# Patient Record
Sex: Male | Born: 1955
Health system: Southern US, Community
[De-identification: ages and names within clinical notes are randomized; demographics above are authoritative.]

## PROBLEM LIST (undated history)

## (undated) DIAGNOSIS — M797 Fibromyalgia: Secondary | ICD-10-CM

## (undated) DIAGNOSIS — K219 Gastro-esophageal reflux disease without esophagitis: Secondary | ICD-10-CM

## (undated) DIAGNOSIS — R51 Headache: Secondary | ICD-10-CM

## (undated) DIAGNOSIS — R413 Other amnesia: Secondary | ICD-10-CM

## (undated) DIAGNOSIS — F32A Depression, unspecified: Secondary | ICD-10-CM

## (undated) DIAGNOSIS — M109 Gout, unspecified: Secondary | ICD-10-CM

## (undated) DIAGNOSIS — Z951 Presence of aortocoronary bypass graft: Secondary | ICD-10-CM

## (undated) DIAGNOSIS — F419 Anxiety disorder, unspecified: Secondary | ICD-10-CM

## (undated) DIAGNOSIS — E785 Hyperlipidemia, unspecified: Secondary | ICD-10-CM

## (undated) DIAGNOSIS — S161XXA Strain of muscle, fascia and tendon at neck level, initial encounter: Secondary | ICD-10-CM

## (undated) DIAGNOSIS — I2510 Atherosclerotic heart disease of native coronary artery without angina pectoris: Secondary | ICD-10-CM

## (undated) DIAGNOSIS — F329 Major depressive disorder, single episode, unspecified: Secondary | ICD-10-CM

## (undated) DIAGNOSIS — E039 Hypothyroidism, unspecified: Secondary | ICD-10-CM

## (undated) HISTORY — DX: Atherosclerotic heart disease of native coronary artery without angina pectoris: I25.10

## (undated) HISTORY — PX: CATARACT EXTRACTION: SUR2

## (undated) HISTORY — DX: Anxiety disorder, unspecified: F41.9

## (undated) HISTORY — DX: Strain of muscle, fascia and tendon at neck level, initial encounter: S16.1XXA

## (undated) HISTORY — PX: BACK SURGERY: SHX140

## (undated) HISTORY — DX: Gout, unspecified: M10.9

## (undated) HISTORY — DX: Hypothyroidism, unspecified: E03.9

## (undated) HISTORY — DX: Major depressive disorder, single episode, unspecified: F32.9

## (undated) HISTORY — PX: CERVICAL FUSION: SHX112

## (undated) HISTORY — DX: Presence of aortocoronary bypass graft: Z95.1

## (undated) HISTORY — DX: Depression, unspecified: F32.A

## (undated) HISTORY — DX: Fibromyalgia: M79.7

## (undated) HISTORY — DX: Headache: R51

## (undated) HISTORY — PX: KNEE SURGERY: SHX244

## (undated) HISTORY — DX: Gastro-esophageal reflux disease without esophagitis: K21.9

## (undated) HISTORY — DX: Hyperlipidemia, unspecified: E78.5

## (undated) HISTORY — DX: Other amnesia: R41.3

## (undated) HISTORY — PX: ROTATOR CUFF REPAIR: SHX139

---

## 2013-04-26 LAB — CBC AND DIFFERENTIAL: Platelets: 223 10*3/uL (ref 150–399)

## 2013-05-11 LAB — LYME DISEASE AB, QUANT, IGM: Lyme Ab: <0.91

## 2013-05-11 LAB — CBC AND DIFFERENTIAL
Hemoglobin: 15.3 g/dL (ref 13.5–17.5)
WBC: 8.5 10^3/mL

## 2013-05-11 LAB — TSH: TSH: 2.22 u[IU]/mL (ref <=5.90)

## 2013-05-11 LAB — URIC ACID: URIC ACID: 6.2

## 2013-06-10 HISTORY — PX: CORONARY ARTERY BYPASS GRAFT: SHX141

## 2014-07-08 ENCOUNTER — Ambulatory Visit: Payer: Self-pay | Admitting: Family Medicine

## 2014-07-09 ENCOUNTER — Ambulatory Visit (INDEPENDENT_AMBULATORY_CARE_PROVIDER_SITE_OTHER): Payer: 59 | Admitting: Family Medicine

## 2014-07-09 ENCOUNTER — Encounter: Payer: Self-pay | Admitting: Family Medicine

## 2014-07-09 VITALS — BP 135/78 | HR 65 | Ht 70.0 in | Wt 206.0 lb

## 2014-07-09 DIAGNOSIS — M4806 Spinal stenosis, lumbar region: Secondary | ICD-10-CM

## 2014-07-09 DIAGNOSIS — M109 Gout, unspecified: Secondary | ICD-10-CM

## 2014-07-09 DIAGNOSIS — K219 Gastro-esophageal reflux disease without esophagitis: Secondary | ICD-10-CM | POA: Diagnosis not present

## 2014-07-09 DIAGNOSIS — M48061 Spinal stenosis, lumbar region without neurogenic claudication: Secondary | ICD-10-CM

## 2014-07-09 DIAGNOSIS — Z951 Presence of aortocoronary bypass graft: Secondary | ICD-10-CM

## 2014-07-09 DIAGNOSIS — E039 Hypothyroidism, unspecified: Secondary | ICD-10-CM

## 2014-07-09 DIAGNOSIS — E785 Hyperlipidemia, unspecified: Secondary | ICD-10-CM

## 2014-07-09 HISTORY — DX: Gout, unspecified: M10.9

## 2014-07-09 HISTORY — DX: Presence of aortocoronary bypass graft: Z95.1

## 2014-07-09 HISTORY — DX: Hypothyroidism, unspecified: E03.9

## 2014-07-09 MED ORDER — PANTOPRAZOLE SODIUM 40 MG PO TBEC
40.0000 mg | DELAYED_RELEASE_TABLET | Freq: Every day | ORAL | Status: DC
Start: 2014-07-09 — End: 2014-07-09

## 2014-07-09 MED ORDER — LIDOCAINE 5 % EX PTCH: 1.0000 | MEDICATED_PATCH | CUTANEOUS | Status: DC

## 2014-07-09 MED ORDER — DULOXETINE HCL 60 MG PO CPEP: 60.0000 mg | ORAL_CAPSULE | Freq: Every day | ORAL | Status: DC

## 2014-07-09 MED ORDER — ALLOPURINOL 100 MG PO TABS: 100.0000 mg | ORAL_TABLET | Freq: Every day | ORAL | Status: DC

## 2014-07-09 MED ORDER — HYDROCODONE-ACETAMINOPHEN 5-325 MG PO TABS: 1.0000 | ORAL_TABLET | Freq: Four times a day (QID) | ORAL | Status: DC | PRN

## 2014-07-09 MED ORDER — PANTOPRAZOLE SODIUM 40 MG PO TBEC
40.0000 mg | DELAYED_RELEASE_TABLET | Freq: Every day | ORAL | Status: DC
Start: 2014-07-09 — End: 2014-07-22

## 2014-07-09 MED ORDER — ATORVASTATIN CALCIUM 80 MG PO TABS
80.0000 mg | ORAL_TABLET | Freq: Every day | ORAL | Status: DC
Start: 2014-07-09 — End: 2014-07-22

## 2014-07-09 NOTE — Progress Notes (Signed)
CC: John Jefferson is a 59 y.o. male is here for Establish Care   Subjective: HPI:  Pleasant 59 year old here to establish care  History of CABG that was performed a little over a year ago. Prior to that and even today he denies any exertional chest pain. He's been on aspirin ever since the procedure along with Lipitor without myalgias or right upper quadrant pain.  History of hypothyroidism that was noticed a little over a year ago when he was experiencing extreme fatigue. Since starting on levothyroxine he's not noticed any improvement with his fatigue. Current levothyroxine dose had to be increased a little over 6 months ago due to elevated TSH.  History of GERD currently taking Protonix on a daily basis. Denies any epigastric discomfort or reflux symptoms provided he takes this on a daily basis.  History of gout that has involved the arches of the feet and the third fourth and fifth toes bilaterally. He was started on a low-dose of allopurinol a few years ago and has had no joint pain or suspicion of a gout flare ever since.  He tells me he has a history of spinal stenosis in the lumbar region which has required a unknown type of back surgery. He had severe radicular pain down both legs prior to the surgery which has now resolved. He still has chronic pain that he takes hydrocodone for chronic daily basis, pain is located in the lower midline of the lumbar region and is nonradiating. He wants to see a pain management specialist  Review of Systems - General ROS: negative for - chills, fever, night sweats, weight gain or weight loss Ophthalmic ROS: negative for - decreased vision Psychological ROS: negative for - anxiety or depression ENT ROS: negative for - hearing change, nasal congestion, tinnitus or allergies Hematological and Lymphatic ROS: negative for - bleeding problems, bruising or swollen lymph nodes Breast ROS: negative Respiratory ROS: no cough, shortness of breath, or  wheezing Cardiovascular ROS: no chest pain or dyspnea on exertion Gastrointestinal ROS: no abdominal pain, change in bowel habits, or black or bloody stools Genito-Urinary ROS: negative for - genital discharge, genital ulcers, incontinence or abnormal bleeding from genitals Musculoskeletal ROS: negative for - joint pain or muscle pain other than that described above Neurological ROS: negative for - headaches or memory loss Dermatological ROS: negative for lumps, mole changes, rash and skin lesion changes  Past Medical History  Diagnosis Date  . S/P CABG x 3 07/09/2014  . Hypothyroidism 07/09/2014  . Gout 07/09/2014    No past surgical history on file. No family history on file.  History   Social History  . Marital Status: Married    Spouse Name: N/A  . Number of Children: N/A  . Years of Education: N/A   Occupational History  . Not on file.   Social History Main Topics  . Smoking status: Former Smoker    Quit date: 07/09/1986  . Smokeless tobacco: Not on file  . Alcohol Use: No  . Drug Use: No  . Sexual Activity:    Partners: Female   Other Topics Concern  . Not on file   Social History Narrative  . No narrative on file     Objective: BP 135/78 mmHg  Pulse 65  Ht 5\' 10"  (1.778 m)  Wt 206 lb (93.441 kg)  BMI 29.56 kg/m2  General: Alert and Oriented, No Acute Distress HEENT: Pupils equal, round, reactive to light. Conjunctivae clear.  Moist mucous membranes Lungs: Clear to auscultation  bilaterally, no wheezing/ronchi/rales.  Comfortable work of breathing. Good air movement. Cardiac: Regular rate and rhythm. Normal S1/S2.  No murmurs, rubs, nor gallops.  No carotid bruits Extremities: No peripheral edema.  Strong peripheral pulses.  Mental Status: No depression, anxiety, nor agitation. Skin: Warm and dry.  Assessment & Plan: Michel was seen today for establish care.  Diagnoses and all orders for this visit:  S/P CABG x 3 Orders: -     Ambulatory referral to  Cardiology  Hypothyroidism, unspecified hypothyroidism type Orders: -     TSH  Hyperlipidemia Orders: -     Lipid panel -     COMPLETE METABOLIC PANEL WITH GFR -     atorvastatin (LIPITOR) 80 MG tablet; Take 1 tablet (80 mg total) by mouth daily.  Gastroesophageal reflux disease, esophagitis presence not specified Orders: -     DULoxetine (CYMBALTA) 60 MG capsule; Take 1 capsule (60 mg total) by mouth daily. -     Discontinue: pantoprazole (PROTONIX) 40 MG tablet; Take 1 tablet (40 mg total) by mouth daily. -     pantoprazole (PROTONIX) 40 MG tablet; Take 1 tablet (40 mg total) by mouth daily.  Gout without tophus, unspecified cause, unspecified chronicity, unspecified site Orders: -     Uric acid -     COMPLETE METABOLIC PANEL WITH GFR -     allopurinol (ZYLOPRIM) 100 MG tablet; Take 1 tablet (100 mg total) by mouth daily.  Spinal stenosis of lumbar region Orders: -     Ambulatory referral to Pain Clinic -     HYDROcodone-acetaminophen (NORCO/VICODIN) 5-325 MG per tablet; Take 1 tablet by mouth every 6 (six) hours as needed for moderate pain. -     lidocaine (LIDODERM) 5 %; Place 1 patch onto the skin daily. Remove & Discard patch within 12 hours or as directed by MD   History of CABG, CAD currently clinically stable continue aspirin and statin. Due for lipid panel which will be ordered today. He would like a referral to a cardiologist which seems reasonable. Checking renal and kidney function GERD: Controlled on Protonix Gout: Controlled on low-dose allopurinol checking uric acid in hopes of it being less than 6 Spinal stenosis: Referral to pain clinic, hydrocodone prescription and lidocaine to last him until he gets established. Hypothyroidism: Clinically uncontrolled but due for TSH prior to adjusting levothyroxine Refills on Cymbalta for depression which he believes is well controlled  Return for Follow up will be based on lab results, anticipate follow up in 3-6  months.Marland Kitchen

## 2014-07-10 LAB — COMPLETE METABOLIC PANEL WITHOUT GFR
ALT: 28 U/L (ref 0–53)
AST: 28 U/L (ref 0–37)
Albumin: 4.5 g/dL (ref 3.5–5.2)
Alkaline Phosphatase: 91 U/L (ref 39–117)
BUN: 15 mg/dL (ref 6–23)
CO2: 28 meq/L (ref 19–32)
Calcium: 9.5 mg/dL (ref 8.4–10.5)
Chloride: 105 meq/L (ref 96–112)
Creat: 1.05 mg/dL (ref 0.50–1.35)
GFR, Est African American: 89 mL/min
GFR, Est Non African American: 77 mL/min
Glucose, Bld: 96 mg/dL (ref 70–99)
Potassium: 4.2 meq/L (ref 3.5–5.3)
Sodium: 141 meq/L (ref 135–145)
Total Bilirubin: 0.9 mg/dL (ref 0.2–1.2)
Total Protein: 6.9 g/dL (ref 6.0–8.3)

## 2014-07-10 LAB — LIPID PANEL
Cholesterol: 130 mg/dL (ref 0–200)
HDL: 34 mg/dL — AB (ref >=40)
LDL Cholesterol: 75 mg/dL (ref 0–99)
Total CHOL/HDL Ratio: 3.8 Ratio
Triglycerides: 105 mg/dL (ref <150)
VLDL: 21 mg/dL (ref 0–40)

## 2014-07-10 LAB — TSH: TSH: 0.733 u[IU]/mL (ref 0.350–4.500)

## 2014-07-10 LAB — URIC ACID: Uric Acid, Serum: 6.8 mg/dL (ref 4.0–7.8)

## 2014-07-11 ENCOUNTER — Telehealth: Payer: Self-pay | Admitting: Family Medicine

## 2014-07-11 DIAGNOSIS — M109 Gout, unspecified: Secondary | ICD-10-CM

## 2014-07-11 MED ORDER — ALLOPURINOL 300 MG PO TABS: 300.0000 mg | ORAL_TABLET | Freq: Every day | ORAL | Status: DC

## 2014-07-11 MED ORDER — LEVOTHYROXINE SODIUM 75 MCG PO TABS
75.0000 ug | ORAL_TABLET | Freq: Every day | ORAL | Status: DC
Start: 2014-07-11 — End: 2014-07-22

## 2014-07-11 NOTE — Telephone Encounter (Signed)
Seth Bake, Will you please let patient know that his cholesterol, blood sugar, kidney function, liver function and LDL cholesterol were all controlled and normal.  His uric acid level was above a goal of less than six to help prevent gout complications.  I'd recommend he increase his allopurinol dose to 300mg  daily to lower his uric acid level.  For the first month after making this adjustment I'd recommend he take 400mg  of ibuprofen every morning since adjustments in allopurinol can sometimes bring on a gout flare. TSH was normal so levothyroxine refills were sent to [facility] along with the new allopurinol dose. F/U three months.

## 2014-07-11 NOTE — Telephone Encounter (Signed)
Left VM asking patient to call back for results..John Jefferson

## 2014-07-18 ENCOUNTER — Telehealth: Payer: Self-pay | Admitting: *Deleted

## 2014-07-18 MED ORDER — COLCHICINE 0.6 MG PO CAPS: 1.0000 | ORAL_CAPSULE | Freq: Every day | ORAL | Status: DC

## 2014-07-18 NOTE — Telephone Encounter (Signed)
Pt called back about his lab results for 07/11/14 ( in re his gout flares) states he cannot take ibuprofen

## 2014-07-18 NOTE — Telephone Encounter (Signed)
Ok, another option would be to take colchicine daily for the first 90 days after he begins the increased dose of allopurinol.  A rx for this was sent to [facility] in [place].  Stop colchicine if developing diffuse muscle aches.

## 2014-07-19 NOTE — Telephone Encounter (Signed)
Pt.notified

## 2014-07-22 ENCOUNTER — Telehealth: Payer: Self-pay | Admitting: Family Medicine

## 2014-07-22 ENCOUNTER — Other Ambulatory Visit: Payer: Self-pay | Admitting: *Deleted

## 2014-07-22 DIAGNOSIS — E785 Hyperlipidemia, unspecified: Secondary | ICD-10-CM

## 2014-07-22 DIAGNOSIS — K219 Gastro-esophageal reflux disease without esophagitis: Secondary | ICD-10-CM

## 2014-07-22 MED ORDER — ATORVASTATIN CALCIUM 80 MG PO TABS: 80.0000 mg | ORAL_TABLET | Freq: Every day | ORAL | Status: DC

## 2014-07-22 MED ORDER — PANTOPRAZOLE SODIUM 40 MG PO TBEC: 40.0000 mg | DELAYED_RELEASE_TABLET | Freq: Every day | ORAL | Status: DC

## 2014-07-22 MED ORDER — LEVOTHYROXINE SODIUM 75 MCG PO TABS: 75.0000 ug | ORAL_TABLET | Freq: Every day | ORAL | Status: DC

## 2014-07-22 NOTE — Telephone Encounter (Signed)
Medication is not covered on plan - CF

## 2014-07-22 NOTE — Telephone Encounter (Signed)
Received fax from [employer] and this medication is a covered drug and does not need a pa. - CF PA - [id number]

## 2014-07-22 NOTE — Telephone Encounter (Signed)
Received a fax for pa on colchicine 0.6mg  sent through cover my meds and waiting on auth. -  CF

## 2014-07-22 NOTE — Telephone Encounter (Signed)
Received fax on pa for pantoprazole sodium tabs sent through cover my meds waiting on auth. - CF

## 2014-07-23 ENCOUNTER — Telehealth: Payer: Self-pay | Admitting: Family Medicine

## 2014-07-23 MED ORDER — COLCHICINE 0.6 MG PO TABS: 0.6000 mg | ORAL_TABLET | Freq: Every day | ORAL | Status: DC

## 2014-07-23 NOTE — Telephone Encounter (Signed)
Left message on vm and rx and savings card up front

## 2014-07-23 NOTE — Telephone Encounter (Signed)
Seth Bake, Will you please let patient know that [employer] has flat out denied any coverage for the capsule version of colchicine.  I'd recommend he try to use a savings voucher for the tablet form that I'll print off and place in your inbox.

## 2014-07-25 ENCOUNTER — Telehealth: Payer: Self-pay | Admitting: Family Medicine

## 2014-07-25 ENCOUNTER — Encounter: Payer: Self-pay | Admitting: Family Medicine

## 2014-07-25 DIAGNOSIS — M5459 Other low back pain: Secondary | ICD-10-CM | POA: Insufficient documentation

## 2014-07-25 DIAGNOSIS — M5030 Other cervical disc degeneration, unspecified cervical region: Secondary | ICD-10-CM | POA: Insufficient documentation

## 2014-07-25 DIAGNOSIS — I2510 Atherosclerotic heart disease of native coronary artery without angina pectoris: Secondary | ICD-10-CM | POA: Insufficient documentation

## 2014-07-25 DIAGNOSIS — M545 Low back pain: Secondary | ICD-10-CM | POA: Insufficient documentation

## 2014-07-25 DIAGNOSIS — E559 Vitamin D deficiency, unspecified: Secondary | ICD-10-CM | POA: Insufficient documentation

## 2014-07-25 NOTE — Telephone Encounter (Signed)
Received fax from [employer] and they denied coverage on colchicine due to the product being a plan exclusion. - CF

## 2014-07-25 NOTE — Telephone Encounter (Signed)
Received fax for pa on colchicine tablets sent through cover my meds waiting on auth - CF

## 2014-07-26 ENCOUNTER — Encounter: Payer: Self-pay | Admitting: Family Medicine

## 2014-08-22 ENCOUNTER — Encounter: Payer: Self-pay | Admitting: Family Medicine

## 2014-08-22 ENCOUNTER — Ambulatory Visit (INDEPENDENT_AMBULATORY_CARE_PROVIDER_SITE_OTHER): Payer: 59 | Admitting: Family Medicine

## 2014-08-22 VITALS — BP 142/85 | HR 57 | Wt 202.0 lb

## 2014-08-22 DIAGNOSIS — B029 Zoster without complications: Secondary | ICD-10-CM

## 2014-08-22 MED ORDER — GABAPENTIN 300 MG PO CAPS: 300.0000 mg | ORAL_CAPSULE | Freq: Three times a day (TID) | ORAL | Status: DC | PRN

## 2014-08-22 MED ORDER — VALACYCLOVIR HCL 1 G PO TABS: 1000.0000 mg | ORAL_TABLET | Freq: Three times a day (TID) | ORAL | Status: DC

## 2014-08-22 MED ORDER — ASPIRIN 81 MG PO TABS: 81.0000 mg | ORAL_TABLET | Freq: Every day | ORAL | Status: DC

## 2014-08-22 NOTE — Progress Notes (Signed)
CC: John Jefferson is a 59 y.o. male is here for shingles? and Medication Refill   Subjective: HPI:  Complains of left shoulder and left arm pain that began about 4 days ago, sometime around 2-3 days ago he also noticed red rash under to the touch localized just lateral to his breast. The rash seems to be spreading and becoming more painful. No intervention as of yet. He's never had this before. He felt run down before the pain and rash started but this has resolved. He denies any other motor or sensory disturbances. Symptoms overall are moderate in severity. Denies fevers, chills, cough, chest pain, swollen lymph nodes.   Review Of Systems Outlined In HPI  Past Medical History  Diagnosis Date  . S/P CABG x 3 07/09/2014  . Hypothyroidism 07/09/2014  . Gout 07/09/2014    No past surgical history on file. No family history on file.  History   Social History  . Marital Status: Married    Spouse Name: N/A  . Number of Children: N/A  . Years of Education: N/A   Occupational History  . Not on file.   Social History Main Topics  . Smoking status: Former Smoker    Quit date: 07/09/1986  . Smokeless tobacco: Not on file  . Alcohol Use: No  . Drug Use: No  . Sexual Activity:    Partners: Female   Other Topics Concern  . Not on file   Social History Narrative     Objective: BP 142/85 mmHg  Pulse 57  Wt 202 lb (91.627 kg)  Vital signs reviewed. General: Alert and Oriented, No Acute Distress HEENT: Pupils equal, round, reactive to light. Conjunctivae clear.  External ears unremarkable.  Moist mucous membranes. Lungs: Clear and comfortable work of breathing, speaking in full sentences without accessory muscle use. Cardiac: Regular rate and rhythm.  Neuro: CN II-XII grossly intact, gait normal. Extremities: No peripheral edema.  Strong peripheral pulses.  Mental Status: No depression, anxiety, nor agitation. Logical though process. Skin: Warm and dry. Clusters of closely grouped  vesicles on a bed of moderate erythema which are tender to the touch just lateral to the left nipple extending back around the T6 or T7 dermatome, also mildly involving the back  Assessment & Plan: John Jefferson was seen today for shingles? and medication refill.  Diagnoses and all orders for this visit:  Shingles  Other orders -     valACYclovir (VALTREX) 1000 MG tablet; Take 1 tablet (1,000 mg total) by mouth 3 (three) times daily. -     gabapentin (NEURONTIN) 300 MG capsule; Take 1 capsule (300 mg total) by mouth 3 (three) times daily as needed (pain from shingles). -     aspirin 81 MG tablet; Take 1 tablet (81 mg total) by mouth daily.   Shingles: Start Valtrex as soon as possible, begin as needed Neurontin. Discussed that symptoms should plateau and then begin to improve as of this weekend however visual and pain improvement will be somewhat slow and may take weeks to months to fully resolve.Signs and symptoms requring emergent/urgent reevaluation were discussed with the patient.  Return if symptoms worsen or fail to improve.

## 2014-08-26 ENCOUNTER — Ambulatory Visit: Payer: 59 | Admitting: Family Medicine

## 2014-09-11 ENCOUNTER — Ambulatory Visit (INDEPENDENT_AMBULATORY_CARE_PROVIDER_SITE_OTHER): Payer: 59 | Admitting: Cardiology

## 2014-09-11 ENCOUNTER — Encounter: Payer: Self-pay | Admitting: Cardiology

## 2014-09-11 VITALS — BP 140/82 | HR 76 | Ht 70.0 in | Wt 206.0 lb

## 2014-09-11 DIAGNOSIS — E785 Hyperlipidemia, unspecified: Secondary | ICD-10-CM

## 2014-09-11 DIAGNOSIS — Z951 Presence of aortocoronary bypass graft: Secondary | ICD-10-CM | POA: Diagnosis not present

## 2014-09-11 NOTE — Assessment & Plan Note (Signed)
Continue statin. 

## 2014-09-11 NOTE — Progress Notes (Signed)
HPI: 59 year old male for evaluation of coronary artery disease. Patient is status post coronary artery bypass graft in 2015 in [place] ([place] to LAD, [place] to [place] and [place]G to [place]). Patient recently moved to this area and presents to establish. He denies dyspnea on exertion, orthopnea, PND, pedal edema, palpitations, syncope or exertional chest pain.  Current Outpatient Prescriptions  Medication Sig Dispense Refill  . allopurinol (ZYLOPRIM) 300 MG tablet Take 1 tablet (300 mg total) by mouth daily. 90 tablet 1  . aspirin 81 MG tablet Take 1 tablet (81 mg total) by mouth daily. 90 tablet 3  . atorvastatin (LIPITOR) 80 MG tablet Take 1 tablet (80 mg total) by mouth daily. 90 tablet 1  . colchicine 0.6 MG tablet Take 1 tablet (0.6 mg total) by mouth daily. For the first three months of increased allopurinol dose. 30 tablet 2  . DULoxetine (CYMBALTA) 60 MG capsule Take 1 capsule (60 mg total) by mouth daily. 90 capsule 1  . gabapentin (NEURONTIN) 300 MG capsule Take 1 capsule (300 mg total) by mouth 3 (three) times daily as needed (pain from shingles). (Patient taking differently: Take 300 mg by mouth daily. ) 90 capsule 0  . HYDROcodone-acetaminophen (NORCO/VICODIN) 5-325 MG per tablet Take 1 tablet by mouth every 6 (six) hours as needed for moderate pain. 60 tablet 0  . levothyroxine (SYNTHROID, LEVOTHROID) 75 MCG tablet Take 1 tablet (75 mcg total) by mouth daily before breakfast. 90 tablet 1  . lidocaine (LIDODERM) 5 % Place 1 patch onto the skin daily. Remove & Discard patch within 12 hours or as directed by MD 60 patch 5  . pantoprazole (PROTONIX) 40 MG tablet Take 1 tablet (40 mg total) by mouth daily. 90 tablet 1   No current facility-administered medications for this visit.    No Known Allergies   Past Medical History  Diagnosis Date  . S/P CABG x 3 07/09/2014  . Hypothyroidism 07/09/2014  . Gout 07/09/2014  . CAD (coronary artery disease)   . Hyperlipidemia   . GERD  (gastroesophageal reflux disease)     Past Surgical History  Procedure Laterality Date  . Cervical fusion    . Knee surgery    . Back surgery    . Coronary artery bypass graft  March 2015    Performed in [place]  . Rotator cuff surgery      History   Social History  . Marital Status: Married    Spouse Name: N/A  . Number of Children: 4  . Years of Education: N/A   Occupational History  .      Sales   Social History Main Topics  . Smoking status: Former Smoker    Quit date: 07/09/1986  . Smokeless tobacco: Not on file  . Alcohol Use: No  . Drug Use: No  . Sexual Activity:    Partners: Female   Other Topics Concern  . Not on file   Social History Narrative    Family History  Problem Relation Age of Onset  . Stroke Father     ROS: chronic back pain but no fevers or chills, productive cough, hemoptysis, dysphasia, odynophagia, melena, hematochezia, dysuria, hematuria, rash, seizure activity, orthopnea, PND, pedal edema, claudication. Remaining systems are negative.  Physical Exam:   Blood pressure 140/82, pulse 76, height 5\' 10"  (1.778 m), weight 206 lb (93.441 kg).  General:  Well developed/well nourished in NAD Skin warm/dry, shingles left chest Patient not depressed No peripheral clubbing Back-normal HEENT-normal/normal  eyelids Neck supple/normal carotid upstroke bilaterally; no bruits; no JVD; no thyromegaly chest - CTA/ normal expansion, previous sternotomy CV - RRR/normal S1 and S2; no murmurs, rubs or gallops;  PMI nondisplaced Abdomen -NT/ND, no HSM, no mass, + bowel sounds, no bruit 2+ femoral pulses, no bruits Ext-no edema, chords, 2+ DP Neuro-grossly nonfocal  ECG Sinus rhythm, no ST changesC

## 2014-09-11 NOTE — Assessment & Plan Note (Addendum)
Continue aspirin and statin.we discussed lifestyle modification including diet and exercise. He does not smoke. His blood pressure is borderline. I have asked him to track this and we will add medications as needed. Obtain all records concerning previous procedures and LV function.

## 2014-09-11 NOTE — Patient Instructions (Signed)
Your physician wants you to follow-up in: ONE YEAR WITH DR CRENSHAW You will receive a reminder letter in the mail two months in advance. If you don't receive a letter, please call our office to schedule the follow-up appointment.  

## 2014-09-12 ENCOUNTER — Telehealth: Payer: Self-pay | Admitting: Cardiology

## 2014-09-12 NOTE — Telephone Encounter (Signed)
Faxed Release Request for records to Dr Traci Sermon [facility] [phone]) per Dr Stanford Breed request.  Faxed 09/12/14. lp

## 2014-09-15 ENCOUNTER — Other Ambulatory Visit: Payer: Self-pay | Admitting: Family Medicine

## 2014-10-08 ENCOUNTER — Emergency Department (HOSPITAL_COMMUNITY)
Admission: EM | Admit: 2014-10-08 | Discharge: 2014-10-08 | Disposition: A | Discharge status: Home / Self Care | Payer: 59 | Attending: Emergency Medicine | Admitting: Emergency Medicine

## 2014-10-08 ENCOUNTER — Emergency Department (HOSPITAL_COMMUNITY): Payer: 59

## 2014-10-08 ENCOUNTER — Encounter (HOSPITAL_COMMUNITY): Payer: Self-pay | Admitting: Emergency Medicine

## 2014-10-08 DIAGNOSIS — R200 Anesthesia of skin: Secondary | ICD-10-CM | POA: Diagnosis not present

## 2014-10-08 DIAGNOSIS — M109 Gout, unspecified: Secondary | ICD-10-CM | POA: Insufficient documentation

## 2014-10-08 DIAGNOSIS — S6992XA Unspecified injury of left wrist, hand and finger(s), initial encounter: Secondary | ICD-10-CM | POA: Insufficient documentation

## 2014-10-08 DIAGNOSIS — E039 Hypothyroidism, unspecified: Secondary | ICD-10-CM | POA: Diagnosis not present

## 2014-10-08 DIAGNOSIS — Y998 Other external cause status: Secondary | ICD-10-CM | POA: Insufficient documentation

## 2014-10-08 DIAGNOSIS — Y92410 Unspecified street and highway as the place of occurrence of the external cause: Secondary | ICD-10-CM | POA: Diagnosis not present

## 2014-10-08 DIAGNOSIS — Z981 Arthrodesis status: Secondary | ICD-10-CM | POA: Insufficient documentation

## 2014-10-08 DIAGNOSIS — Z7982 Long term (current) use of aspirin: Secondary | ICD-10-CM | POA: Diagnosis not present

## 2014-10-08 DIAGNOSIS — Y9389 Activity, other specified: Secondary | ICD-10-CM | POA: Diagnosis not present

## 2014-10-08 DIAGNOSIS — K219 Gastro-esophageal reflux disease without esophagitis: Secondary | ICD-10-CM | POA: Diagnosis not present

## 2014-10-08 DIAGNOSIS — S199XXA Unspecified injury of neck, initial encounter: Secondary | ICD-10-CM | POA: Diagnosis present

## 2014-10-08 DIAGNOSIS — S3992XA Unspecified injury of lower back, initial encounter: Secondary | ICD-10-CM | POA: Diagnosis not present

## 2014-10-08 DIAGNOSIS — E785 Hyperlipidemia, unspecified: Secondary | ICD-10-CM | POA: Diagnosis not present

## 2014-10-08 DIAGNOSIS — S161XXA Strain of muscle, fascia and tendon at neck level, initial encounter: Secondary | ICD-10-CM

## 2014-10-08 DIAGNOSIS — Z87891 Personal history of nicotine dependence: Secondary | ICD-10-CM | POA: Insufficient documentation

## 2014-10-08 DIAGNOSIS — Z79899 Other long term (current) drug therapy: Secondary | ICD-10-CM | POA: Insufficient documentation

## 2014-10-08 DIAGNOSIS — I2510 Atherosclerotic heart disease of native coronary artery without angina pectoris: Secondary | ICD-10-CM | POA: Diagnosis not present

## 2014-10-08 DIAGNOSIS — Z951 Presence of aortocoronary bypass graft: Secondary | ICD-10-CM | POA: Diagnosis not present

## 2014-10-08 MED ORDER — METHOCARBAMOL 500 MG PO TABS: 500.0000 mg | ORAL_TABLET | Freq: Two times a day (BID) | ORAL | Status: DC

## 2014-10-08 MED ORDER — MECLIZINE HCL 25 MG PO TABS
25.0000 mg | ORAL_TABLET | Freq: Once | ORAL | Status: AC
  Administered 2014-10-08: 25 mg via ORAL
  Filled 2014-10-08: qty 1

## 2014-10-08 MED ORDER — HYDROCODONE-ACETAMINOPHEN 5-325 MG PO TABS: 1.0000 | ORAL_TABLET | ORAL | Status: DC | PRN

## 2014-10-08 MED ORDER — ONDANSETRON HCL 4 MG/2ML IJ SOLN
4.0000 mg | Freq: Once | INTRAMUSCULAR | Status: AC
  Administered 2014-10-08: 4 mg via INTRAVENOUS
  Filled 2014-10-08: qty 2

## 2014-10-08 MED ORDER — DIAZEPAM 5 MG PO TABS: 5.0000 mg | ORAL_TABLET | Freq: Two times a day (BID) | ORAL | Status: DC

## 2014-10-08 MED ORDER — MORPHINE SULFATE 4 MG/ML IJ SOLN
4.0000 mg | INTRAMUSCULAR | Status: DC | PRN
  Administered 2014-10-08 (×2): 4 mg via INTRAVENOUS
  Filled 2014-10-08 (×2): qty 1

## 2014-10-08 NOTE — Discharge Instructions (Signed)

## 2014-10-08 NOTE — ED Notes (Signed)
Patient transported to MRI 

## 2014-10-08 NOTE — ED Notes (Signed)
Bed: WA23 Expected date:  Expected time:  Means of arrival:  Comments: EMS 
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
## 2014-10-16 ENCOUNTER — Telehealth: Payer: Self-pay | Admitting: Cardiology

## 2014-10-16 NOTE — Telephone Encounter (Signed)
Received records from [facility] that were requested per Dr Stanford Breed.  Given to Dr Stanford Breed to review.  lp

## 2014-10-22 ENCOUNTER — Other Ambulatory Visit: Payer: Self-pay | Admitting: Family Medicine

## 2014-11-06 ENCOUNTER — Ambulatory Visit: Payer: 59 | Admitting: Family Medicine

## 2014-11-07 ENCOUNTER — Ambulatory Visit (INDEPENDENT_AMBULATORY_CARE_PROVIDER_SITE_OTHER): Payer: 59 | Admitting: Family Medicine

## 2014-11-07 ENCOUNTER — Encounter: Payer: Self-pay | Admitting: Family Medicine

## 2014-11-07 VITALS — BP 131/83 | HR 76 | Wt 211.0 lb

## 2014-11-07 DIAGNOSIS — M62838 Other muscle spasm: Secondary | ICD-10-CM

## 2014-11-07 DIAGNOSIS — F0781 Postconcussional syndrome: Secondary | ICD-10-CM | POA: Diagnosis not present

## 2014-11-07 DIAGNOSIS — M6248 Contracture of muscle, other site: Secondary | ICD-10-CM | POA: Diagnosis not present

## 2014-11-07 MED ORDER — AMITRIPTYLINE HCL 25 MG PO TABS: 25.0000 mg | ORAL_TABLET | Freq: Every day | ORAL | Status: DC
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
## 2014-11-07 NOTE — Assessment & Plan Note (Signed)
Sensation syndrome likely. Plan for cognitive rest. Trial of amitriptyline at night. Return in 2 weeks if not better. Refer to neurology per patient request.

## 2014-11-07 NOTE — Patient Instructions (Signed)
Thank you for coming in today. Take amitriptyline at bedtime. Cognitive rest away from work for 1-2 weeks. Return in 2 weeks if not better. Attended physical therapy  Concussion A concussion, or closed-head injury, is a brain injury caused by a direct blow to the head or by a quick and sudden movement (jolt) of the head or neck. Concussions are usually not life-threatening. Even so, the effects of a concussion can be serious. If you have had a concussion before, you are more likely to experience concussion-like symptoms after a direct blow to the head.  CAUSES  Direct blow to the head, such as from running into another player during a soccer game, being hit in a fight, or hitting your head on a hard surface.  A jolt of the head or neck that causes the brain to move back and forth inside the skull, such as in a car crash. SIGNS AND SYMPTOMS The signs of a concussion can be hard to notice. Early on, they may be missed by you, family members, and health care providers. You may look fine but act or feel differently. Symptoms are usually temporary, but they may last for days, weeks, or even longer. Some symptoms may appear right away while others may not show up for hours or days. Every head injury is different. Symptoms include:  Mild to moderate headaches that will not go away.  A feeling of pressure inside your head.  Having more trouble than usual:  Learning or remembering things you have heard.  Answering questions.  Paying attention or concentrating.  Organizing daily tasks.  Making decisions and solving problems.  Slowness in thinking, acting or reacting, speaking, or reading.  Getting lost or being easily confused.  Feeling tired all the time or lacking energy (fatigued).  Feeling drowsy.  Sleep disturbances.  Sleeping more than usual.  Sleeping less than usual.  Trouble falling asleep.  Trouble sleeping (insomnia).  Loss of balance or feeling lightheaded or  dizzy.  Nausea or vomiting.  Numbness or tingling.  Increased sensitivity to:  Sounds.  Lights.  Distractions.  Vision problems or eyes that tire easily.  Diminished sense of taste or smell.  Ringing in the ears.  Mood changes such as feeling sad or anxious.  Becoming easily irritated or angry for little or no reason.  Lack of motivation.  Seeing or hearing things other people do not see or hear (hallucinations). DIAGNOSIS Your health care provider can usually diagnose a concussion based on a description of your injury and symptoms. He or she will ask whether you passed out (lost consciousness) and whether you are having trouble remembering events that happened right before and during your injury. Your evaluation might include:  A brain scan to look for signs of injury to the brain. Even if the test shows no injury, you may still have a concussion.  Blood tests to be sure other problems are not present. TREATMENT  Concussions are usually treated in an emergency department, in urgent care, or at a clinic. You may need to stay in the hospital overnight for further treatment.  Tell your health care provider if you are taking any medicines, including prescription medicines, over-the-counter medicines, and natural remedies. Some medicines, such as blood thinners (anticoagulants) and aspirin, may increase the chance of complications. Also tell your health care provider whether you have had alcohol or are taking illegal drugs. This information may affect treatment.  Your health care provider will send you home with important instructions to follow.  How fast you will recover from a concussion depends on many factors. These factors include how severe your concussion is, what part of your brain was injured, your age, and how healthy you were before the concussion.  Most people with mild injuries recover fully. Recovery can take time. In general, recovery is slower in older persons.  Also, persons who have had a concussion in the past or have other medical problems may find that it takes longer to recover from their current injury. HOME CARE INSTRUCTIONS General Instructions  Carefully follow the directions your health care provider gave you.  Only take over-the-counter or prescription medicines for pain, discomfort, or fever as directed by your health care provider.  Take only those medicines that your health care provider has approved.  Do not drink alcohol until your health care provider says you are well enough to do so. Alcohol and certain other drugs may slow your recovery and can put you at risk of further injury.  If it is harder than usual to remember things, write them down.  If you are easily distracted, try to do one thing at a time. For example, do not try to watch TV while fixing dinner.  Talk with family members or close friends when making important decisions.  Keep all follow-up appointments. Repeated evaluation of your symptoms is recommended for your recovery.  Watch your symptoms and tell others to do the same. Complications sometimes occur after a concussion. Older adults with a brain injury may have a higher risk of serious complications, such as a blood clot on the brain.  Tell your teachers, school nurse, school counselor, coach, athletic trainer, or work Freight forwarder about your injury, symptoms, and restrictions. Tell them about what you can or cannot do. They should watch for:  Increased problems with attention or concentration.  Increased difficulty remembering or learning new information.  Increased time needed to complete tasks or assignments.  Increased irritability or decreased ability to cope with stress.  Increased symptoms.  Rest. Rest helps the brain to heal. Make sure you:  Get plenty of sleep at night. Avoid staying up late at night.  Keep the same bedtime hours on weekends and weekdays.  Rest during the day. Take daytime  naps or rest breaks when you feel tired.  Limit activities that require a lot of thought or concentration. These include:  Doing homework or job-related work.  Watching TV.  Working on the computer.  Avoid any situation where there is potential for another head injury (football, hockey, soccer, basketball, martial arts, downhill snow sports and horseback riding). Your condition will get worse every time you experience a concussion. You should avoid these activities until you are evaluated by the appropriate follow-up health care providers. Returning To Your Regular Activities You will need to return to your normal activities slowly, not all at once. You must give your body and brain enough time for recovery.  Do not return to sports or other athletic activities until your health care provider tells you it is safe to do so.  Ask your health care provider when you can drive, ride a bicycle, or operate heavy machinery. Your ability to react may be slower after a brain injury. Never do these activities if you are dizzy.  Ask your health care provider about when you can return to work or school. Preventing Another Concussion It is very important to avoid another brain injury, especially before you have recovered. In rare cases, another injury can lead to permanent  brain damage, brain swelling, or death. The risk of this is greatest during the first 7-10 days after a head injury. Avoid injuries by:  Wearing a seat belt when riding in a car.  Drinking alcohol only in moderation.  Wearing a helmet when biking, skiing, skateboarding, skating, or doing similar activities.  Avoiding activities that could lead to a second concussion, such as contact or recreational sports, until your health care provider says it is okay.  Taking safety measures in your home.  Remove clutter and tripping hazards from floors and stairways.  Use grab bars in bathrooms and handrails by stairs.  Place non-slip  mats on floors and in bathtubs.  Improve lighting in dim areas. SEEK MEDICAL CARE IF:  You have increased problems paying attention or concentrating.  You have increased difficulty remembering or learning new information.  You need more time to complete tasks or assignments than before.  You have increased irritability or decreased ability to cope with stress.  You have more symptoms than before. Seek medical care if you have any of the following symptoms for more than 2 weeks after your injury:  Lasting (chronic) headaches.  Dizziness or balance problems.  Nausea.  Vision problems.  Increased sensitivity to noise or light.  Depression or mood swings.  Anxiety or irritability.  Memory problems.  Difficulty concentrating or paying attention.  Sleep problems.  Feeling tired all the time. SEEK IMMEDIATE MEDICAL CARE IF:  You have severe or worsening headaches. These may be a sign of a blood clot in the brain.  You have weakness (even if only in one hand, leg, or part of the face).  You have numbness.  You have decreased coordination.  You vomit repeatedly.  You have increased sleepiness.  One pupil is larger than the other.  You have convulsions.  You have slurred speech.  You have increased confusion. This may be a sign of a blood clot in the brain.  You have increased restlessness, agitation, or irritability.  You are unable to recognize people or places.  You have neck pain.  It is difficult to wake you up.  You have unusual behavior changes.  You lose consciousness. MAKE SURE YOU:  Understand these instructions.  Will watch your condition.  Will get help right away if you are not doing well or get worse. Document Released: 06/19/2003 Document Revised: 04/03/2013 Document Reviewed: 10/19/2012 [facility] Patient Information 2015 [place], [place]. This information is not intended to replace advice given to you by your health care provider. Make  sure you discuss any questions you have with your health care provider.

## 2014-11-21 ENCOUNTER — Encounter: Payer: Self-pay | Admitting: Neurology

## 2014-11-21 ENCOUNTER — Ambulatory Visit (INDEPENDENT_AMBULATORY_CARE_PROVIDER_SITE_OTHER): Payer: 59 | Admitting: Neurology

## 2014-11-21 VITALS — BP 118/77 | HR 68 | Ht 70.0 in | Wt 208.6 lb

## 2014-11-21 DIAGNOSIS — G4486 Cervicogenic headache: Secondary | ICD-10-CM

## 2014-11-21 DIAGNOSIS — S161XXA Strain of muscle, fascia and tendon at neck level, initial encounter: Secondary | ICD-10-CM

## 2014-11-21 DIAGNOSIS — R51 Headache: Secondary | ICD-10-CM | POA: Diagnosis not present

## 2014-11-21 HISTORY — DX: Strain of muscle, fascia and tendon at neck level, initial encounter: S16.1XXA

## 2014-11-21 HISTORY — DX: Cervicogenic headache: G44.86

## 2014-11-21 MED ORDER — GABAPENTIN 300 MG PO CAPS: 300.0000 mg | ORAL_CAPSULE | Freq: Three times a day (TID) | ORAL | Status: DC
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
## 2014-11-21 NOTE — Patient Instructions (Addendum)
We will start you on gabapentin taking 300 mg twice daily, if you tolerate the medication, but require more, please let us know. We will set she will for MRI evaluation of the brain. We will set you up for physical therapy, follow-up in several months.   Cervical Sprain A cervical sprain is an injury in the neck in which the strong, fibrous tissues (ligaments) that connect your neck bones stretch or tear. Cervical sprains can range from mild to severe. Severe cervical sprains can cause the neck vertebrae to be unstable. This can lead to damage of the spinal cord and can result in serious nervous system problems. The amount of time it takes for a cervical sprain to get better depends on the cause and extent of the injury. Most cervical sprains heal in 1 to 3 weeks. CAUSES  Severe cervical sprains may be caused by:   Contact sport injuries (such as from football, rugby, wrestling, hockey, auto racing, gymnastics, diving, martial arts, or boxing).   Motor vehicle collisions.   Whiplash injuries. This is an injury from a sudden forward and backward whipping movement of the head and neck.  Falls.  Mild cervical sprains may be caused by:   Being in an awkward position, such as while cradling a telephone between your ear and shoulder.   Sitting in a chair that does not offer proper support.   Working at a poorly Landscape [employer] station.   Looking up or down for long periods of time.  SYMPTOMS   Pain, soreness, stiffness, or a burning sensation in the front, back, or sides of the neck. This discomfort may develop immediately after the injury or slowly, 24 hours or more after the injury.   Pain or tenderness directly in the middle of the back of the neck.   Shoulder or upper back pain.   Limited ability to move the neck.   Headache.   Dizziness.   Weakness, numbness, or tingling in the hands or arms.   Muscle spasms.   Difficulty swallowing or chewing.    Tenderness and swelling of the neck.  DIAGNOSIS  Most of the time your health care provider can diagnose a cervical sprain by taking your history and doing a physical exam. Your health care provider will ask about previous neck injuries and any known neck problems, such as arthritis in the neck. X-rays may be taken to find out if there are any other problems, such as with the bones of the neck. Other tests, such as a CT scan or MRI, may also be needed.  TREATMENT  Treatment depends on the severity of the cervical sprain. Mild sprains can be treated with rest, keeping the neck in place (immobilization), and pain medicines. Severe cervical sprains are immediately immobilized. Further treatment is done to help with pain, muscle spasms, and other symptoms and may include:  Medicines, such as pain relievers, numbing medicines, or muscle relaxants.   Physical therapy. This may involve stretching exercises, strengthening exercises, and posture training. Exercises and improved posture can help stabilize the neck, strengthen muscles, and help stop symptoms from returning.  HOME CARE INSTRUCTIONS   Put ice on the injured area.   Put ice in a plastic bag.   Place a towel between your skin and the bag.   Leave the ice on for 15-20 minutes, 3-4 times a day.   If your injury was severe, you may have been given a cervical collar to wear. A cervical collar is a two-piece collar  designed to keep your neck from moving while it heals.  Do not remove the collar unless instructed by your health care provider.  If you have long hair, keep it outside of the collar.  Ask your health care provider before making any adjustments to your collar. Minor adjustments may be required over time to improve comfort and reduce pressure on your chin or on the back of your head.  Ifyou are allowed to remove the collar for cleaning or bathing, follow your health care provider's instructions on how to do so  safely.  Keep your collar clean by wiping it with mild soap and water and drying it completely. If the collar you have been given includes removable pads, remove them every 1-2 days and hand wash them with soap and water. Allow them to air dry. They should be completely dry before you wear them in the collar.  If you are allowed to remove the collar for cleaning and bathing, wash and dry the skin of your neck. Check your skin for irritation or sores. If you see any, tell your health care provider.  Do not drive while wearing the collar.   Only take over-the-counter or prescription medicines for pain, discomfort, or fever as directed by your health care provider.   Keep all follow-up appointments as directed by your health care provider.   Keep all physical therapy appointments as directed by your health care provider.   Make any needed adjustments to your workstation to promote good posture.   Avoid positions and activities that make your symptoms worse.   Warm up and stretch before being active to help prevent problems.  SEEK MEDICAL CARE IF:   Your pain is not controlled with medicine.   You are unable to decrease your pain medicine over time as planned.   Your activity level is not improving as expected.  SEEK IMMEDIATE MEDICAL CARE IF:   You develop any bleeding.  You develop stomach upset.  You have signs of an allergic reaction to your medicine.   Your symptoms get worse.   You develop new, unexplained symptoms.   You have numbness, tingling, weakness, or paralysis in any part of your body.  MAKE SURE YOU:   Understand these instructions.  Will watch your condition.  Will get help right away if you are not doing well or get worse. Document Released: 01/24/2007 Document Revised: 04/03/2013 Document Reviewed: 10/04/2012 [facility] Patient Information 2015 [place], [place]. This information is not intended to replace advice given to you by your health care  provider. Make sure you discuss any questions you have with your health care provider.

## 2014-11-26 ENCOUNTER — Telehealth: Payer: Self-pay | Admitting: Family Medicine

## 2014-11-26 NOTE — Telephone Encounter (Signed)
Pt came in bc he needs prescription for plavix 25mg  and would like if you could [facility]  Thanks

## 2014-11-26 NOTE — Telephone Encounter (Signed)
John Jefferson, Can you please clarify this with the patient, 25mg  of plavix is a really strange dose. Perhaps he meant 75mg ?

## 2014-11-27 ENCOUNTER — Ambulatory Visit (INDEPENDENT_AMBULATORY_CARE_PROVIDER_SITE_OTHER): Payer: 59

## 2014-11-27 DIAGNOSIS — G4486 Cervicogenic headache: Secondary | ICD-10-CM

## 2014-11-27 DIAGNOSIS — S161XXA Strain of muscle, fascia and tendon at neck level, initial encounter: Secondary | ICD-10-CM

## 2014-11-27 DIAGNOSIS — R51 Headache: Secondary | ICD-10-CM | POA: Diagnosis not present

## 2014-11-27 MED ORDER — CLOPIDOGREL BISULFATE 75 MG PO TABS: 75.0000 mg | ORAL_TABLET | Freq: Every day | ORAL | Status: DC

## 2014-11-27 NOTE — Telephone Encounter (Signed)
The dose is 75 mg

## 2014-11-27 NOTE — Addendum Note (Signed)
Addended by: Marcial Pacas on: 11/27/2014 01:01 PM   Modules accepted: Orders

## 2014-11-27 NOTE — Telephone Encounter (Signed)
Thanks, Rx sent to [facility] on [address]

## 2014-11-28 ENCOUNTER — Other Ambulatory Visit: Payer: Self-pay | Admitting: Family Medicine

## 2014-11-29 ENCOUNTER — Telehealth: Payer: Self-pay | Admitting: *Deleted

## 2014-11-29 ENCOUNTER — Telehealth: Payer: Self-pay | Admitting: Neurology

## 2014-11-29 NOTE — Telephone Encounter (Signed)
Spouse called back again to speak to nurse about results

## 2014-11-29 NOTE — Telephone Encounter (Signed)
Pt was written out of work on 11/07/14 for 1-3 weeks by Dr. Tommi Rumps due to concussion. Pt left a message that he has PT scheduled on Monday. He wants to be written out of work for two more weeks. Does he need an appt with you first to eval?

## 2014-11-29 NOTE — Telephone Encounter (Signed)
I called the wife. The MRI the brain is essentially normal. Nose atrophy, no evidence of contusions or other brain injuries. The patient will complete the physical therapy, call me if he needs an increase on the gabapentin dosing.   MRI brain 11/29/2014:  IMPRESSION:  Equivocal MRI brain (without) demonstrating: 1. Few tiny/punctate foci of subcortical and periventricular non-specific gliosis. 2. No acute findings.

## 2014-11-29 NOTE — Telephone Encounter (Signed)
John Jefferson, Dr. Clovis Riley note recommended he follow up with Dr. Georgina Snell if no better in two weeks.  Please schedule an appointment with him.

## 2014-11-29 NOTE — Telephone Encounter (Signed)
Pt.notified

## 2014-11-29 NOTE — Telephone Encounter (Signed)
Spouse called requesting MRI results

## 2014-12-02 ENCOUNTER — Ambulatory Visit (INDEPENDENT_AMBULATORY_CARE_PROVIDER_SITE_OTHER): Payer: 59 | Admitting: Physical Therapy

## 2014-12-02 ENCOUNTER — Other Ambulatory Visit: Payer: Self-pay

## 2014-12-02 ENCOUNTER — Ambulatory Visit (INDEPENDENT_AMBULATORY_CARE_PROVIDER_SITE_OTHER): Payer: 59 | Admitting: Family Medicine

## 2014-12-02 ENCOUNTER — Encounter: Payer: Self-pay | Admitting: Family Medicine

## 2014-12-02 ENCOUNTER — Encounter: Payer: Self-pay | Admitting: Physical Therapy

## 2014-12-02 VITALS — BP 123/73 | HR 71 | Ht 70.0 in | Wt 210.0 lb

## 2014-12-02 DIAGNOSIS — M542 Cervicalgia: Secondary | ICD-10-CM

## 2014-12-02 DIAGNOSIS — S161XXD Strain of muscle, fascia and tendon at neck level, subsequent encounter: Secondary | ICD-10-CM | POA: Diagnosis not present

## 2014-12-02 DIAGNOSIS — M436 Torticollis: Secondary | ICD-10-CM

## 2014-12-02 DIAGNOSIS — F0781 Postconcussional syndrome: Secondary | ICD-10-CM

## 2014-12-02 DIAGNOSIS — G44209 Tension-type headache, unspecified, not intractable: Secondary | ICD-10-CM | POA: Diagnosis not present

## 2014-12-02 DIAGNOSIS — M6248 Contracture of muscle, other site: Secondary | ICD-10-CM | POA: Diagnosis not present

## 2014-12-02 DIAGNOSIS — M62838 Other muscle spasm: Secondary | ICD-10-CM

## 2014-12-02 MED ORDER — GABAPENTIN 300 MG PO CAPS: 300.0000 mg | ORAL_CAPSULE | Freq: Three times a day (TID) | ORAL | Status: DC

## 2014-12-02 MED ORDER — AMANTADINE HCL 100 MG PO CAPS: 100.0000 mg | ORAL_CAPSULE | Freq: Two times a day (BID) | ORAL | Status: DC

## 2014-12-02 NOTE — Progress Notes (Signed)
John Jefferson is a 59 y.o. male who presents to [facility]  today for follow up with post concussion syndrome. Patient notes continued mental fogginess. He was seen by a neurologist who prescribed gabapentin which has helped. He has stopped amitriptyline because it did not help. Additionally he has had a brain MRI which was reportedly normal. He is here today because he feels as though he is not much better. He feels as though he is not able to work because of trouble concentrating.  His predominant bothersome symptoms are trouble sleeping and trouble concentrating. He denies any SI or HI.  Past Medical History  Diagnosis Date  . S/P CABG x 3 07/09/2014  . Hypothyroidism 07/09/2014  . Gout 07/09/2014  . CAD (coronary artery disease)   . Hyperlipidemia   . GERD (gastroesophageal reflux disease)   . Depression   . Anxiety   . Fibromyalgia   . Cervical strain 11/21/2014  . Cervicogenic headache 11/21/2014   Past Surgical History  Procedure Laterality Date  . Cervical fusion    . Knee surgery    . Back surgery    . Coronary artery bypass graft  March 2015    Performed in [place]  . Rotator cuff surgery    . Cataract extraction Bilateral    Social History  Substance Use Topics  . Smoking status: Former Smoker    Quit date: 04/12/1985  . Smokeless tobacco: Never Used  . Alcohol Use: No   ROS as above Medications: Current Outpatient Prescriptions  Medication Sig Dispense Refill  . allopurinol (ZYLOPRIM) 300 MG tablet Take 1 tablet (300 mg total) by mouth daily. 90 tablet 1  . aspirin 81 MG tablet Take 1 tablet (81 mg total) by mouth daily. 90 tablet 3  . atorvastatin (LIPITOR) 80 MG tablet Take 1 tablet by mouth  daily 90 tablet 1  . clopidogrel (PLAVIX) 75 MG tablet Take 1 tablet (75 mg total) by mouth daily. 90 tablet 3  . DULoxetine (CYMBALTA) 60 MG capsule Take 1 capsule by mouth  daily 90 capsule 0  . gabapentin (NEURONTIN) 300 MG capsule  Take 1 capsule (300 mg total) by mouth 3 (three) times daily. 90 capsule 2  . HYDROcodone-acetaminophen (NORCO/VICODIN) 5-325 MG per tablet Take 1 tablet by mouth every 4 (four) hours as needed. 20 tablet 0  . levothyroxine (SYNTHROID, LEVOTHROID) 75 MCG tablet Take 1 tablet by mouth  daily before breakfast 90 tablet 1  . lidocaine (LIDODERM) 5 % Place 1 patch onto the skin daily. Remove & Discard patch within 12 hours or as directed by MD    . pantoprazole (PROTONIX) 40 MG tablet Take 1 tablet by mouth  daily 90 tablet 1   No current facility-administered medications for this visit.   No Known Allergies   Exam:  BP 123/73 mmHg  Pulse 71  Ht 5\' 10"  (1.778 m)  Wt 210 lb (95.255 kg)  BMI 30.13 kg/m2 Gen: Well NAD HEENT: EOMI,  MMM Neuro: Alert and oriented normal gait and balance. Psych alert and oriented normal affecther thought process abnormalities.  PHQ9 11  No results found for this or any previous visit (from the past 24 hour(s)). No results found.   Please see individual assessment and plan sections.

## 2014-12-02 NOTE — Patient Instructions (Signed)
Head Press With [facility] - Have a small pillow under your head   [place] [phone]   Tuck chin SLIGHTLY toward chest, keep mouth closed. Feel weight on back of head. Increase weight by pressing head down. Hold _3-5__ seconds. Relax. Repeat _10__ times. Once a day.  Surface: floor   Shoulder Press - have a small pillow under your head to help keep it in neutral    Press both shoulders down. Hold _3-5__ seconds. Repeat _10__ times. Press one shoulder down. Hold _3-5__ seconds Repeat _10__ times. Do other shoulder. If unable to press one or both shoulders, lie in position a few sessions until you can. Perform twice a day Surface: floor   Elevation (Active)   Shrug shoulders up, breathing in. Relax, breathing out. Repeat __10__ times. Do _4-5___ sessions per day.  Scapular Retraction (Standing)   With arms at sides, pinch shoulder blades together. Repeat _10___ times per set. Do __4-5__ sessions per day.  TOTAL LEG: Walking   Swinging arms, walk for 5 mins. Slowly increase time.  Keep chest lifted, shoulders relaxed.  Repeat _4-5__ times per day.  Copyright  VHI. All rights reserved.

## 2014-12-02 NOTE — Assessment & Plan Note (Addendum)
Patient continues to have quite bothersome postconcussion syndrome symptoms. His most dominant bothersome symptom at this time is difficulty concentrating. This is interfering with his ability to go to work. At this point patient is already taking an antidepression medication. We'll start amantadine and recheck in a few weeks. Discussed with patient that amantadine is at this point does not have as good of evidence as other medicines that we've already tried to manage postconcussion symptoms.  Work note extended.
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
## 2014-12-02 NOTE — Patient Instructions (Signed)
Thank you for coming in today. Take amantadine twice daily Return in 2-3 weeks  Post-Concussion Syndrome Post-concussion syndrome describes the symptoms that can occur after a head injury. These symptoms can last from weeks to months. CAUSES  It is not clear why some head injuries cause post-concussion syndrome. It can occur whether your head injury was mild or severe and whether you were wearing head protection or not.  SIGNS AND SYMPTOMS  Memory difficulties.  Dizziness.  Headaches.  Double vision or blurry vision.  Sensitivity to light.  Hearing difficulties.  Depression.  Tiredness.  Weakness.  Difficulty with concentration.  Difficulty sleeping or staying asleep.  Vomiting.  Poor balance or instability on your feet.  Slow reaction time.  Difficulty learning and remembering things you have heard. DIAGNOSIS  There is no test to determine whether you have post-concussion syndrome. Your health care provider may order an imaging scan of your brain, such as a CT scan, to check for other problems that may be causing your symptoms (such as severe injury inside your skull). TREATMENT  Usually, these problems disappear over time without medical care. Your health care provider may prescribe medicine to help ease your symptoms. It is important to follow up with a neurologist to evaluate your recovery and address any lingering symptoms or issues. HOME CARE INSTRUCTIONS   Only take over-the-counter or prescription medicines for pain, discomfort, or fever as directed by your health care provider. Do not take aspirin. Aspirin can slow blood clotting.  Sleep with your head slightly elevated to help with headaches.  Avoid any situation where there is potential for another head injury (football, hockey, soccer, basketball, martial arts, downhill snow sports, and horseback riding). Your condition will get worse every time you experience a concussion. You should avoid these  activities until you are evaluated by the appropriate follow-up health care providers.  Keep all follow-up appointments as directed by your health care provider. SEEK IMMEDIATE MEDICAL CARE IF:  You develop confusion or unusual drowsiness.  You cannot wake the injured person.  You develop nausea or persistent, forceful vomiting.  You feel like you are moving when you are not (vertigo).  You notice the injured person's eyes moving rapidly back and forth. This may be a sign of vertigo.  You have convulsions or faint.  You have severe, persistent headaches that are not relieved by medicine.  You cannot use your arms or legs normally.  Your pupils change size.  You have clear or bloody discharge from the nose or ears.  Your problems are getting worse, not better. MAKE SURE YOU:  Understand these instructions.  Will watch your condition.  Will get help right away if you are not doing well or get worse. Document Released: 09/18/2001 Document Revised: 01/17/2013 Document Reviewed: 07/04/2013 [facility] Patient Information 2015 [place], [place]. This information is not intended to replace advice given to you by your health care provider. Make sure you discuss any questions you have with your health care provider.

## 2014-12-03 ENCOUNTER — Other Ambulatory Visit: Payer: Self-pay | Admitting: Family Medicine

## 2014-12-03 DIAGNOSIS — M109 Gout, unspecified: Secondary | ICD-10-CM

## 2014-12-03 MED ORDER — ALLOPURINOL 300 MG PO TABS: 300.0000 mg | ORAL_TABLET | Freq: Every day | ORAL | Status: DC

## 2014-12-03 MED ORDER — ASPIRIN 81 MG PO TABS: 81.0000 mg | ORAL_TABLET | Freq: Every day | ORAL | Status: DC

## 2014-12-03 MED ORDER — CLOPIDOGREL BISULFATE 75 MG PO TABS: 75.0000 mg | ORAL_TABLET | Freq: Every day | ORAL | Status: DC

## 2014-12-04 ENCOUNTER — Ambulatory Visit (INDEPENDENT_AMBULATORY_CARE_PROVIDER_SITE_OTHER): Payer: 59 | Admitting: Physical Therapy

## 2014-12-04 ENCOUNTER — Encounter: Payer: Self-pay | Admitting: Physical Therapy

## 2014-12-04 DIAGNOSIS — M542 Cervicalgia: Secondary | ICD-10-CM

## 2014-12-04 DIAGNOSIS — G44209 Tension-type headache, unspecified, not intractable: Secondary | ICD-10-CM | POA: Diagnosis not present

## 2014-12-04 DIAGNOSIS — M436 Torticollis: Secondary | ICD-10-CM

## 2014-12-04 DIAGNOSIS — M62838 Other muscle spasm: Secondary | ICD-10-CM

## 2014-12-04 DIAGNOSIS — M6248 Contracture of muscle, other site: Secondary | ICD-10-CM | POA: Diagnosis not present

## 2014-12-04 NOTE — Therapy (Signed)
[place] [facility] [place], [name], [address] Phone: [phone]   Fax:  [phone]  Physical Therapy Treatment  Patient Details  Name: John Jefferson MRN: [id number] Date of Birth: [birth year] Referring Provider:  Gregor Hams, MD  Encounter Date: 12/04/2014      PT End of Session - 12/04/14 1105    Visit Number 2   Number of Visits 18   Date for PT Re-Evaluation 01/13/15   PT Start Time 1025   PT Stop Time 1119   PT Time Calculation (min) 54 min   Activity Tolerance Patient limited by pain      Past Medical History  Diagnosis Date  . S/P CABG x 3 07/09/2014  . Hypothyroidism 07/09/2014  . Gout 07/09/2014  . CAD (coronary artery disease)   . Hyperlipidemia   . GERD (gastroesophageal reflux disease)   . Depression   . Anxiety   . Fibromyalgia   . Cervical strain 11/21/2014  . Cervicogenic headache 11/21/2014    Past Surgical History  Procedure Laterality Date  . Cervical fusion    . Knee surgery    . Back surgery    . Coronary artery bypass graft  March 2015    Performed in [address]  . Rotator cuff surgery    . Cataract extraction Bilateral     There were no vitals filed for this visit.  Visit Diagnosis:  Stiffness of neck  Pain, neck  Tension-type headache, not intractable, unspecified chronicity pattern  Muscle spasms of neck      Subjective Assessment - 12/04/14 1025    Subjective Pt reports the upper neck in the middle hurts alot, worse with chin tuck ex, other exercise is ok   Currently in Pain? Yes   Pain Orientation Left;Right  where shoulders connect  to head   Pain Descriptors / Indicators Stabbing   Pain Radiating Towards into head for HA and across the shoulders in the back.    Pain Frequency Intermittent   Aggravating Factors  pulling head back, turning and moving his head   Pain Relieving Factors holding his head still    Effect of Pain on Daily Activities also having  some LBP, reports this happends when his neck hurst            [facility] PT Assessment - 12/04/14 0001    Assessment   Medical Diagnosis cervical spasm   Onset Date/Surgical Date 10/08/14   Hand Dominance Right   Next MD Visit 0/12/19   Prior Therapy no PT only chiropractic care   AROM   Overall AROM  --  cervical ROM NA pt became very dizzy sittinafter manual work   Right/Left Shoulder Right;Left  WNL   Cervical Flexion 27  with pain   Cervical Extension 27  with pain   Cervical - Right Rotation 37   Cervical - Left Rotation 12  with stabbing pain   Strength   Strength Assessment Site --  bilat shoulders WNL,                      [facility] Adult PT Treatment/Exercise - 12/04/14 0001    Neck Exercises: Machines for Strengthening   UBE (Upper Arm Bike) standing L1 x 5' alt FWD/BWD   Neck Exercises: Theraband   Scapula Retraction 10 reps  yellow   Shoulder Extension 10 reps  yellow   Neck Exercises: Seated   Other Seated Exercise attempted cervical nods - this made him  dizzy   Modalities   Modalities Electrical Stimulation;Moist Heat   Moist Heat Therapy   Number Minutes Moist Heat 15 Minutes   Moist Heat Location Cervical   Electrical Stimulation   Electrical Stimulation Location cervical   Electrical Stimulation Action IFC   Electrical Stimulation Parameters to tolerance   Electrical Stimulation Goals Pain   Manual Therapy   Manual Therapy Passive ROM;Soft tissue mobilization;Manual Traction   Manual therapy comments stretches in all directions cervical    Soft tissue mobilization to bilat upper traps, levator, cervical paraspinals    Passive ROM cervical in all directions   Manual Traction cervical very light                PT Education - 12/04/14 1145    Education provided Yes   Education Details revised head press to keep head in neutral position.  Discussed relaxation methods for him to try to decrease stress in neck and shoulders.     Methods Explanation   Comprehension Verbalized understanding          PT Short Term Goals - 12/02/14 1058    PT SHORT TERM GOAL #1   Title I with initial HEP ( 12/23/14)   Time 3   Period Weeks   Status New   PT SHORT TERM GOAL #2   Title improve cervical ROM by 10 degrees in all motions ( 12/23/14)    Time 3   Period Weeks   Status New   PT SHORT TERM GOAL #3   Title assess shoulder and upper back strength ( 12/23/14)    Time 3   Period Weeks   Status New   PT SHORT TERM GOAL #4   Title reports pain decrease =/> 25% overall ( 12/23/14)   Time 3   Period Weeks   Status New           PT Long Term Goals - 12/02/14 1059    PT LONG TERM GOAL #1   Title I with advanced HEP ( 01/13/15)    Time 6   Period Weeks   Status New   PT LONG TERM GOAL #2   Title demo cervical ROM WFL and pain =/< 2/10 ( 01/13/15)    Time 6   Period Weeks   Status New   PT LONG TERM GOAL #3   Title demo shoulder and upper back strength =/> 5-/5 ( 01/13/15)    Time 6   Period Weeks   Status New   PT LONG TERM GOAL #4   Title tolerate driving =/> 4 hrs before needing a [place] to allow return to work  (01/13/15)   Time 6   Period Weeks   Status New   PT LONG TERM GOAL #5   Title report ability to sleep per his prior level ( 01/13/15)    Time 6   Period Weeks   Status New   Additional Long Term Goals   Additional Long Term Goals Yes   PT LONG TERM GOAL #6   Title improve FOTO =/< 41% limited ( 01/13/15)    Time 6   Period Weeks   Status New               Plan - 12/04/14 1142    Clinical Impression Statement Pt's second visit, had some difficulty with head presses, techniques reviewed and was improved after.   Pt became very dizzy after manual therapy with being in upright posture.  He reports this happens  sometimes and he can wait it out or walk it out.  He declined the need to go to the MD or tro have [place] call someone to drive him home.  Pt had more PROM in his neck in supine than he  has when he is sitting.  He continues to have excessive guarding.    Pt will benefit from skilled therapeutic intervention in order to improve on the following deficits Pain;Postural dysfunction;Hypomobility;Increased muscle spasms;Decreased range of motion   Rehab Potential Good   Clinical Impairments Affecting Rehab Potential C4-7 fusion, fibromyalgia   PT Frequency 3x / week   PT Duration 6 weeks   PT Treatment/Interventions Ultrasound;Neuromuscular re-education;Passive range of motion;Patient/family education;Cryotherapy;Electrical Stimulation;Moist Heat;Therapeutic exercise;Manual techniques;Taping   PT Next Visit Plan progress ROM, manual therapy, modalities to relieve pain.    Consulted and Agree with Plan of Care Patient        Problem List Patient Active Problem List   Diagnosis Date Noted  . Cervical strain 11/21/2014  . Cervicogenic headache 11/21/2014  . Post concussion syndrome 11/07/2014  . Lumbar facet joint pain 07/25/2014  . CAD (coronary artery disease) 07/25/2014  . Vitamin D deficiency 07/25/2014  . Degenerative disc disease, cervical 07/25/2014  . S/P CABG x 3 07/09/2014  . Hypothyroidism 07/09/2014  . Hyperlipidemia 07/09/2014  . GERD (gastroesophageal reflux disease) 07/09/2014  . Gout 07/09/2014    Jeral Pinch PT 12/04/2014, 11:47 AM  [facility] [place], [name], [address] Phone: [phone]   Fax:  [phone]

## 2014-12-06 ENCOUNTER — Encounter: Payer: Self-pay | Admitting: Rehabilitative and Restorative Service Providers"

## 2014-12-06 ENCOUNTER — Ambulatory Visit (INDEPENDENT_AMBULATORY_CARE_PROVIDER_SITE_OTHER): Payer: 59 | Admitting: Rehabilitative and Restorative Service Providers"

## 2014-12-06 DIAGNOSIS — M436 Torticollis: Secondary | ICD-10-CM

## 2014-12-06 DIAGNOSIS — M6248 Contracture of muscle, other site: Secondary | ICD-10-CM | POA: Diagnosis not present

## 2014-12-06 DIAGNOSIS — G44209 Tension-type headache, unspecified, not intractable: Secondary | ICD-10-CM | POA: Diagnosis not present

## 2014-12-06 DIAGNOSIS — M542 Cervicalgia: Secondary | ICD-10-CM | POA: Diagnosis not present

## 2014-12-06 DIAGNOSIS — M62838 Other muscle spasm: Secondary | ICD-10-CM

## 2014-12-06 NOTE — Patient Instructions (Signed)
Scapula Adduction With Pectorals, Low   Stand in doorframe with palms against frame and arms at 45. Lean forward and squeeze shoulder blades. Hold ___ seconds. Repeat ___ times per session. Do ___ sessions per day.  Copyright  VHI. All rights reserved.    Scapula Adduction With Pectorals, Mid-Range   Stand in doorframe with palms against frame and arms at 90. Lean forward and squeeze shoulder blades. Hold ___ seconds. Repeat ___ times per session. Do ___ sessions per day. \Scapula Adduction With Pectorals, High   Stand in doorframe with palms against frame and arms at 120. Lean forward and squeeze shoulder blades. Hold ___ seconds. Repeat ___ times per session. Do ___ sessions per day.  Flexors, Supine   Lie on back, head on small, rolled towel or thin pillow. Tip chin down. Hold 5-10___ seconds. Repeat _10__ times per session. Do _3-4__ sessions per day.   Nodding yes/nodding no  10-15 reps each when lying down  Copyright  VHI. All rights reserved.

## 2014-12-06 NOTE — Therapy (Signed)
[place], [name], [address] Phone: [phone]   Fax:  [phone]  Physical Therapy Treatment  Patient Details  Name: John Jefferson MRN: [id number] Date of Birth: March 18, 1956 Referring Provider:  Gregor Hams, MD  Encounter Date: 12/06/2014      PT End of Session - 12/06/14 1015    PT Stop Time 1021   Activity Tolerance Patient limited by pain      Past Medical History  Diagnosis Date  . S/P CABG x 3 07/09/2014  . Hypothyroidism 07/09/2014  . Gout 07/09/2014  . CAD (coronary artery disease)   . Hyperlipidemia   . GERD (gastroesophageal reflux disease)   . Depression   . Anxiety   . Fibromyalgia   . Cervical strain 11/21/2014  . Cervicogenic headache 11/21/2014    Past Surgical History  Procedure Laterality Date  . Cervical fusion    . Knee surgery    . Back surgery    . Coronary artery bypass graft  March 2015    Performed in [place]  . Rotator cuff surgery    . Cataract extraction Bilateral     There were no vitals filed for this visit.  Visit Diagnosis:  Stiffness of neck  Pain, neck  Tension-type headache, not intractable, unspecified chronicity pattern  Muscle spasms of neck      Subjective Assessment - 12/06/14 0925    Subjective Patient reports that his neck is stiff and he continues to have a HA. He feels a little looser with PT but things tighten up again quickly. He continues to have dizziness and "head confusion". Seem s to have increased tightness iin neck from exercises but also has tightness through neck with functional activities such as dressing, bathing, grooming, etc.    Currently in Pain? Yes   Pain Score 5    Pain Location Neck   Pain Orientation Mid  base of skull   Pain Descriptors / Indicators Shooting   Pain Radiating Towards up back of head   Pain Onset More than a month ago   Pain Frequency Intermittent                          [facility] Adult PT Treatment/Exercise - 12/06/14 0001    Neck Exercises: Standing   Other Standing Exercises doorway stretch 3 rpositioins 20-30 sec hold 2 reps    Neck Exercises: Supine   Neck Retraction 10 reps  2-3 sec hold   Neck Retraction Limitations gentle/neutral spine supported on pillow   Other Supine Exercise nodding yes/no 10-12 reps each    Moist Heat Therapy   Number Minutes Moist Heat 15 Minutes   Moist Heat Location Cervical  thoracic   Ultrasound   Ultrasound Location bilateral cervical mm to occiput    Ultrasound Parameters 3.74mHz; 100%; 1.4w/cm2; 8 min   Ultrasound Goals Pain  tightness   Manual Therapy   Manual therapy comments occipital inhibition    Soft tissue mobilization to bilat upper traps, levator, cervical paraspinals    Myofascial Release through anterior chest/upper traps bilat   Manual Traction manual cervical tracting several times during manual work                   PT Short Term Goals - 12/02/14 1058    PT SHORT TERM GOAL #1   Title I with initial HEP ( 12/23/14)   Time 3   Period Weeks  Status New   PT SHORT TERM GOAL #2   Title improve cervical ROM by 10 degrees in all motions ( 12/23/14)    Time 3   Period Weeks   Status New   PT SHORT TERM GOAL #3   Title assess shoulder and upper back strength ( 12/23/14)    Time 3   Period Weeks   Status New   PT SHORT TERM GOAL #4   Title reports pain decrease =/> 25% overall ( 12/23/14)   Time 3   Period Weeks   Status New           PT Long Term Goals - 12/02/14 1059    PT LONG TERM GOAL #1   Title I with advanced HEP ( 01/13/15)    Time 6   Period Weeks   Status New   PT LONG TERM GOAL #2   Title demo cervical ROM WFL and pain =/< 2/10 ( 01/13/15)    Time 6   Period Weeks   Status New   PT LONG TERM GOAL #3   Title demo shoulder and upper back strength =/> 5-/5 ( 01/13/15)    Time 6   Period Weeks   Status New   PT LONG TERM GOAL #4   Title tolerate driving =/> 4 hrs  before needing a [place] to allow return to work  (01/13/15)   Time 6   Period Weeks   Status New   PT LONG TERM GOAL #5   Title report ability to sleep per his prior level ( 01/13/15)    Time 6   Period Weeks   Status New   Additional Long Term Goals   Additional Long Term Goals Yes   PT LONG TERM GOAL #6   Title improve FOTO =/< 41% limited ( 01/13/15)    Time 6   Period Weeks   Status New               Plan - 12/06/14 1016    Clinical Impression Statement Very stiff and tight through cervical musculature(ant/lat/post) into upper traps. Poor cervical movement. Guarded posture and movement patterns. Will try occiital inhibition at home with golf balls as tolerated. He has a TENS unit at home and will continue using that for pain management. Will try [place] in clinic today to improve tissue extemsibility prior to manual work.    Pt will benefit from skilled therapeutic intervention in order to improve on the following deficits Pain;Postural dysfunction;Hypomobility;Increased muscle spasms;Decreased range of motion   Rehab Potential Good   PT Frequency 2x / week   PT Duration 6 weeks   PT Treatment/Interventions Ultrasound;Neuromuscular re-education;Passive range of motion;Patient/family education;Cryotherapy;Electrical Stimulation;Moist Heat;Therapeutic exercise;Manual techniques;Taping   PT Next Visit Plan progress ROM, manual therapy, modalities to relieve pain. Assess response to [place] and HEP   PT Home Exercise Plan trial of pec stretch to improve posture/upright position; trial of occipital inhibition with golf balls.    Consulted and Agree with Plan of Care Patient        Problem List Patient Active Problem List   Diagnosis Date Noted  . Cervical strain 11/21/2014  . Cervicogenic headache 11/21/2014  . Post concussion syndrome 11/07/2014  . Lumbar facet joint pain 07/25/2014  . CAD (coronary artery disease) 07/25/2014  . Vitamin D deficiency 07/25/2014  . Degenerative  disc disease, cervical 07/25/2014  . S/P CABG x 3 07/09/2014  . Hypothyroidism 07/09/2014  . Hyperlipidemia 07/09/2014  . GERD (gastroesophageal reflux disease) 07/09/2014  .  Gout 07/09/2014    Saanvika Vazques Nilda Simmer, PT, MPH 12/06/2014, 10:22 AM  [facility] [place], [name], [address] Phone: [phone]   Fax:  [phone]

## 2014-12-09 ENCOUNTER — Ambulatory Visit (INDEPENDENT_AMBULATORY_CARE_PROVIDER_SITE_OTHER): Payer: 59 | Admitting: Physical Therapy

## 2014-12-09 DIAGNOSIS — M6248 Contracture of muscle, other site: Secondary | ICD-10-CM | POA: Diagnosis not present

## 2014-12-09 DIAGNOSIS — M62838 Other muscle spasm: Secondary | ICD-10-CM

## 2014-12-09 DIAGNOSIS — M436 Torticollis: Secondary | ICD-10-CM | POA: Diagnosis not present

## 2014-12-09 DIAGNOSIS — M542 Cervicalgia: Secondary | ICD-10-CM

## 2014-12-09 DIAGNOSIS — G44209 Tension-type headache, unspecified, not intractable: Secondary | ICD-10-CM | POA: Diagnosis not present

## 2014-12-09 NOTE — Therapy (Signed)
[facility] [place], [name], [address] Phone: [phone]   Fax:  [phone]  Physical Therapy Treatment  Patient Details  Name: John Jefferson MRN: [id number] Date of Birth: 1956/03/27 Referring Provider:  Gregor Hams, MD  Encounter Date: 12/09/2014      PT End of Session - 12/09/14 0804    Visit Number 4   Number of Visits 18   Date for PT Re-Evaluation 01/13/15   PT Start Time 0805   PT Stop Time 0905   PT Time Calculation (min) 60 min   Activity Tolerance Patient limited by pain      Past Medical History  Diagnosis Date  . S/P CABG x 3 07/09/2014  . Hypothyroidism 07/09/2014  . Gout 07/09/2014  . CAD (coronary artery disease)   . Hyperlipidemia   . GERD (gastroesophageal reflux disease)   . Depression   . Anxiety   . Fibromyalgia   . Cervical strain 11/21/2014  . Cervicogenic headache 11/21/2014    Past Surgical History  Procedure Laterality Date  . Cervical fusion    . Knee surgery    . Back surgery    . Coronary artery bypass graft  March 2015    Performed in [place]  . Rotator cuff surgery    . Cataract extraction Bilateral     There were no vitals filed for this visit.  Visit Diagnosis:  Stiffness of neck  Pain, neck  Tension-type headache, not intractable, unspecified chronicity pattern  Muscle spasms of neck      Subjective Assessment - 12/09/14 0805    Subjective Pt reports he had relief for 1/2 day after last treatment.  Pt reports he felt looser, but things always seem to stiffen back up.  Continues with head fog, as well as dizziness when he attempts to look up or down.    Currently in Pain? Yes   Pain Score 7    Pain Location Neck   Pain Orientation --  base of skull   Pain Descriptors / Indicators --  shooting if move head, Pulling if look side to side   Aggravating Factors  staying still, moving head past point of comfort   Pain Relieving Factors moving  head a little bit            [facility] PT Assessment - 12/09/14 0001    Assessment   Medical Diagnosis cervical spasm   Onset Date/Surgical Date 10/08/14   Hand Dominance Right   Next MD Visit not scheduled    Prior Therapy no PT only chiropractic care   AROM   Cervical Flexion 25   Cervical Extension 20  painful, dizzy sensation   Cervical - Right Side Bend 12   Cervical - Left Side Bend 8   Cervical - Right Rotation 44   Cervical - Left Rotation 40  with pain          [facility] Adult PT Treatment/Exercise - 12/09/14 0001    Neck Exercises: Standing   Other Standing Exercises doorway stretch 3 rpositioins 20-30 sec hold 2 reps - pt reported this increased symptoms in neck.   Neck Exercises: Seated   Shoulder Shrugs 5 reps   Other Seated Exercise Attempted lateral side bend stretch (gentle) - unable; increased dizziness.    Neck Exercises: Supine   Neck Retraction 5 reps   Neck Retraction Limitations gentle/neutral spine supported on pillow   Shoulder Flexion Both;10 reps  with yellow band    Upper  Extremity D2 Flexion;10 reps;Theraband   Theraband Level (UE D2) Level 1 (Yellow)   Other Supine Exercise on 1/2 foam roll (head supported on pillow) x 5 snow angels, then arms in 90 deg horizontal abd   Other Supine Exercise Thoracic lifts with 3 sec hold x 10 sec; horizontal ABD with yellow band x 10 reps x 2 sets;    Manual Therapy   Manual therapy comments noted increased tightness in Rt upper trap/scalene > Lt.    Myofascial Release suboccipital release    Manual Traction gentle manual cervical traction 30 sec hold, 5 sec release x 8 reps.      Modalities:  IFC to bilateral cervical spine paraspinals and levator attachment x 15 min for pain; to tolerance   MHP to same area x 15 min in supine, head supported by pillows.          PT Short Term Goals - 12/02/14 1058    PT SHORT TERM GOAL #1   Title I with initial HEP ( 12/23/14)   Time 3   Period Weeks   Status New    PT SHORT TERM GOAL #2   Title improve cervical ROM by 10 degrees in all motions ( 12/23/14)    Time 3   Period Weeks   Status New   PT SHORT TERM GOAL #3   Title assess shoulder and upper back strength ( 12/23/14)    Time 3   Period Weeks   Status New   PT SHORT TERM GOAL #4   Title reports pain decrease =/> 25% overall ( 12/23/14)   Time 3   Period Weeks   Status New           PT Long Term Goals - 12/02/14 1059    PT LONG TERM GOAL #1   Title I with advanced HEP ( 01/13/15)    Time 6   Period Weeks   Status New   PT LONG TERM GOAL #2   Title demo cervical ROM WFL and pain =/< 2/10 ( 01/13/15)    Time 6   Period Weeks   Status New   PT LONG TERM GOAL #3   Title demo shoulder and upper back strength =/> 5-/5 ( 01/13/15)    Time 6   Period Weeks   Status New   PT LONG TERM GOAL #4   Title tolerate driving =/> 4 hrs before needing a bredak to allow return to work  (01/13/15)   Time 6   Period Weeks   Status New   PT LONG TERM GOAL #5   Title report ability to sleep per his prior level ( 01/13/15)    Time 6   Period Weeks   Status New   Additional Long Term Goals   Additional Long Term Goals Yes   PT LONG TERM GOAL #6   Title improve FOTO =/< 41% limited ( 01/13/15)    Time 6   Period Weeks   Status New               Plan - 12/09/14 1147    Clinical Impression Statement Pt continues with guarded posture and movements of cervical spine. Pt reported decrease in neck symptoms after performing supine shoulder exercises, however once sitting up, symptoms returned. Pt again had decrease in neck/head symptoms with manual therapy and MHP/estim, but those symptoms returned again once upright, along with dizziness. Slowly progressing with goals.    Pt will benefit from skilled therapeutic intervention in  order to improve on the following deficits Pain;Postural dysfunction;Hypomobility;Increased muscle spasms;Decreased range of motion   Rehab Potential Good   Clinical  Impairments Affecting Rehab Potential C4-7 fusion, fibromyalgia   PT Frequency 2x / week   PT Duration 6 weeks   PT Treatment/Interventions Ultrasound;Neuromuscular re-education;Passive range of motion;Patient/family education;Cryotherapy;Electrical Stimulation;Moist Heat;Therapeutic exercise;Manual techniques;Taping   PT Next Visit Plan progress ROM, manual therapy, modalities to relieve pain. Progress HEP if tolerated.    Consulted and Agree with Plan of Care Patient        Problem List Patient Active Problem List   Diagnosis Date Noted  . Cervical strain 11/21/2014  . Cervicogenic headache 11/21/2014  . Post concussion syndrome 11/07/2014  . Lumbar facet joint pain 07/25/2014  . CAD (coronary artery disease) 07/25/2014  . Vitamin D deficiency 07/25/2014  . Degenerative disc disease, cervical 07/25/2014  . S/P CABG x 3 07/09/2014  . Hypothyroidism 07/09/2014  . Hyperlipidemia 07/09/2014  . GERD (gastroesophageal reflux disease) 07/09/2014  . Gout 07/09/2014   Kerin Perna, PTA 12/09/2014 12:05 PM  [place], [name], [address] Phone: [phone]   Fax:  [phone]

## 2014-12-10 ENCOUNTER — Ambulatory Visit (INDEPENDENT_AMBULATORY_CARE_PROVIDER_SITE_OTHER): Payer: 59 | Admitting: Physical Therapy

## 2014-12-10 DIAGNOSIS — M542 Cervicalgia: Secondary | ICD-10-CM | POA: Diagnosis not present

## 2014-12-10 DIAGNOSIS — M436 Torticollis: Secondary | ICD-10-CM | POA: Diagnosis not present

## 2014-12-10 DIAGNOSIS — G44209 Tension-type headache, unspecified, not intractable: Secondary | ICD-10-CM | POA: Diagnosis not present

## 2014-12-10 DIAGNOSIS — M62838 Other muscle spasm: Secondary | ICD-10-CM

## 2014-12-10 DIAGNOSIS — M6248 Contracture of muscle, other site: Secondary | ICD-10-CM

## 2014-12-10 NOTE — Therapy (Signed)
[place], [name], [address] Phone: [phone]   Fax:  [phone]  Physical Therapy Treatment  Patient Details  Name: John Jefferson MRN: [id number] Date of Birth: [birth year] Referring Provider:  Gregor Hams, MD  Encounter Date: 12/10/2014      PT End of Session - 12/10/14 1026    Visit Number 5   Number of Visits 18   Date for PT Re-Evaluation 01/13/15   PT Start Time 0936   PT Stop Time 1042   PT Time Calculation (min) 66 min   Activity Tolerance Patient limited by pain      Past Medical History  Diagnosis Date  . S/P CABG x 3 07/09/2014  . Hypothyroidism 07/09/2014  . Gout 07/09/2014  . CAD (coronary artery disease)   . Hyperlipidemia   . GERD (gastroesophageal reflux disease)   . Depression   . Anxiety   . Fibromyalgia   . Cervical strain 11/21/2014  . Cervicogenic headache 11/21/2014    Past Surgical History  Procedure Laterality Date  . Cervical fusion    . Knee surgery    . Back surgery    . Coronary artery bypass graft  March 2015    Performed in [place]  . Rotator cuff surgery    . Cataract extraction Bilateral     There were no vitals filed for this visit.  Visit Diagnosis:  Stiffness of neck  Pain, neck  Tension-type headache, not intractable, unspecified chronicity pattern  Muscle spasms of neck      Subjective Assessment - 12/10/14 0939    Subjective Pt reports he felt a little better when leaving here last time; relief only lasted a few hours.  Then, last night was "really bad"; used TENS unit for relief.    Currently in Pain? Yes   Pain Score 7    Pain Location Neck   Pain Orientation Right;Left  (L>R)            [facility] PT Assessment - 12/10/14 0001    Assessment   Medical Diagnosis cervical spasm   Onset Date/Surgical Date 10/08/14   Hand Dominance Right   Next MD Visit 12/30/14   Prior Therapy no PT only chiropractic care            [facility] Adult PT Treatment/Exercise - 12/10/14 0001    Neck Exercises: Machines for Strengthening   UBE (Upper Arm Bike) Standing: L1 - 1 min forward, 30 sec backward, 30 sec forward   Neck Exercises: Seated   Cervical Rotation 15 reps  each direction   Other Seated Exercise Rowing  with yellow band x 12 reps    Neck Exercises: Supine   Shoulder Flexion Both;10 reps  with yellow band, on 1/2 foam roll    Upper Extremity D2 Flexion;10 reps;Theraband  on 1/2 foam roll   Theraband Level (UE D2) Level 1 (Yellow)   Other Supine Exercise Hooklying on 1/2 foam roll (head supported on pillow) x 10 snow angels, then arms in 90 deg horizontal abd for 1 min x 2 reps,    Other Supine Exercise Hooklying: bilateral shoulder ER with yellow band x 10 reps x 2 sets; thoracic lifts with 3 sec hold x 10 reps    Modalities   Modalities Electrical Stimulation;Moist Heat   Moist Heat Therapy   Number Minutes Moist Heat 15 Minutes   Moist Heat Location Cervical   Electrical Stimulation   Electrical Stimulation Location cervical paraspinals  Electrical Stimulation Action IFC   Electrical Stimulation Parameters to tolerance x 15 min    Electrical Stimulation Goals Pain   Manual Therapy   Manual Therapy Soft tissue mobilization;Myofascial release   Manual therapy comments Biofreeze applied to posterior neck at end of session    Myofascial Release to anterior neck musculature, platysma, scalenes; suboccipital release   Manual Traction gentle manual cervical traction 30 sec hold, 5 sec release x 8 reps.                 PT Education - 12/10/14 1106    Education provided Yes   Education Details HEP - issued yellow band with handles.    Person(s) Educated Patient   Methods Handout;Explanation;Demonstration   Comprehension Verbalized understanding;Returned demonstration          PT Short Term Goals - 12/02/14 1058    PT SHORT TERM GOAL #1   Title I with initial HEP ( 12/23/14)   Time 3    Period Weeks   Status New   PT SHORT TERM GOAL #2   Title improve cervical ROM by 10 degrees in all motions ( 12/23/14)    Time 3   Period Weeks   Status New   PT SHORT TERM GOAL #3   Title assess shoulder and upper back strength ( 12/23/14)    Time 3   Period Weeks   Status New   PT SHORT TERM GOAL #4   Title reports pain decrease =/> 25% overall ( 12/23/14)   Time 3   Period Weeks   Status New           PT Long Term Goals - 12/02/14 1059    PT LONG TERM GOAL #1   Title I with advanced HEP ( 01/13/15)    Time 6   Period Weeks   Status New   PT LONG TERM GOAL #2   Title demo cervical ROM WFL and pain =/< 2/10 ( 01/13/15)    Time 6   Period Weeks   Status New   PT LONG TERM GOAL #3   Title demo shoulder and upper back strength =/> 5-/5 ( 01/13/15)    Time 6   Period Weeks   Status New   PT LONG TERM GOAL #4   Title tolerate driving =/> 4 hrs before needing a bredak to allow return to work  (01/13/15)   Time 6   Period Weeks   Status New   PT LONG TERM GOAL #5   Title report ability to sleep per his prior level ( 01/13/15)    Time 6   Period Weeks   Status New   Additional Long Term Goals   Additional Long Term Goals Yes   PT LONG TERM GOAL #6   Title improve FOTO =/< 41% limited ( 01/13/15)    Time 6   Period Weeks   Status New               Plan - 12/10/14 4970    Clinical Impression Statement Pt reported decrease in neck pain to 4/10 with ther ex in supine on 1/2 foam roll. Pt continues to report increased neck pain (L>R) in seated and standing positions with any neck movement. Pt's neck tremulous once sitting upright after estimMHP; required some seated gentle cervical ROM for symptoms to subside.  Pt tolerated supine exercises well, without increase in pain.  No goals met yet.    Pt will benefit from skilled therapeutic intervention  in order to improve on the following deficits Pain;Postural dysfunction;Hypomobility;Increased muscle spasms;Decreased range  of motion   Rehab Potential Good   Clinical Impairments Affecting Rehab Potential C4-7 fusion, fibromyalgia   PT Frequency 2x / week   PT Duration 6 weeks   PT Treatment/Interventions Ultrasound;Neuromuscular re-education;Passive range of motion;Patient/family education;Cryotherapy;Electrical Stimulation;Moist Heat;Therapeutic exercise;Manual techniques;Taping   PT Next Visit Plan Continue progress strengthening and ROM, manual therapy, modalities to relieve pain. May try supine head presses into green therapy ball.    Consulted and Agree with Plan of Care Patient        Problem List Patient Active Problem List   Diagnosis Date Noted  . Cervical strain 11/21/2014  . Cervicogenic headache 11/21/2014  . Post concussion syndrome 11/07/2014  . Lumbar facet joint pain 07/25/2014  . CAD (coronary artery disease) 07/25/2014  . Vitamin D deficiency 07/25/2014  . Degenerative disc disease, cervical 07/25/2014  . S/P CABG x 3 07/09/2014  . Hypothyroidism 07/09/2014  . Hyperlipidemia 07/09/2014  . GERD (gastroesophageal reflux disease) 07/09/2014  . Gout 07/09/2014    Kerin Perna, PTA 12/10/2014 11:22 AM  [place], [name], [address] Phone: [phone]   Fax:  [phone]

## 2014-12-10 NOTE — Patient Instructions (Signed)
  Perform 1-2x / day  Try not to tighten neck during these exercises.    Over Head Pull: Narrow Grip        On back, knees bent, feet flat, band across thighs, elbows straight but relaxed. Pull hands apart (start). Keeping elbows straight, bring arms up and over head, hands toward floor. Keep pull steady on band. Hold momentarily. Return slowly, keeping pull steady, back to start. Repeat _10__ times. Band color _yellow___   Side Pull: Double Arm   On back, knees bent, feet flat. Arms perpendicular to body, shoulder level, elbows straight but relaxed. Pull arms out to sides, elbows straight. Resistance band comes across collarbones, hands toward floor. Hold momentarily. Slowly return to starting position. Repeat _10__ times, 2 sets. Band color __yellow___   Sash   On back, knees bent, feet flat, left hand on left hip, right hand above left. Pull right arm DIAGONALLY (hip to shoulder) across chest. Bring right arm along head toward floor. Hold momentarily. Slowly return to starting position. (THUMB UP) Repeat _10__ times. Do with left arm. Band color __yellow____   Shoulder Rotation: Double Arm   On back, knees bent, feet flat, elbows tucked at sides, bent 90, hands palms up. Pull hands apart and down toward floor, keeping elbows near sides. Hold momentarily. Slowly return to starting position. Repeat __10_ times. Band color __yellow____   Prudencio Pair in [place]: Double Arm   Arms near sides, palms up. Press both arms lightly into floor, slide arms out to side and up alongside head. Keep contact with floor throughout motion. At maximal position, lengthen arms. Hold  10___ seconds. Relax. Slide arms back to start. Repeat _10__ times.   [facility] [place], [place] [address]  [phone] (office) [phone] (fax)

## 2014-12-12 ENCOUNTER — Encounter: Payer: Self-pay | Admitting: Rehabilitative and Restorative Service Providers"

## 2014-12-12 ENCOUNTER — Ambulatory Visit (INDEPENDENT_AMBULATORY_CARE_PROVIDER_SITE_OTHER): Payer: 59 | Admitting: Rehabilitative and Restorative Service Providers"

## 2014-12-12 DIAGNOSIS — M542 Cervicalgia: Secondary | ICD-10-CM

## 2014-12-12 DIAGNOSIS — M436 Torticollis: Secondary | ICD-10-CM

## 2014-12-12 DIAGNOSIS — G44209 Tension-type headache, unspecified, not intractable: Secondary | ICD-10-CM

## 2014-12-12 DIAGNOSIS — M6248 Contracture of muscle, other site: Secondary | ICD-10-CM

## 2014-12-12 DIAGNOSIS — M62838 Other muscle spasm: Secondary | ICD-10-CM

## 2014-12-12 NOTE — Therapy (Signed)
[place], [name], [address] Phone: [phone]   Fax:  [phone]  Physical Therapy Treatment  Patient Details  Name: John Jefferson MRN: [id number] Date of Birth: Sep 27, 1955 Referring Provider:  Gregor Hams, MD  Encounter Date: 12/12/2014      PT End of Session - 12/12/14 1019    Visit Number 6   Number of Visits 18   Date for PT Re-Evaluation 01/13/15   PT Start Time 0933   PT Stop Time 1024   PT Time Calculation (min) 51 min      Past Medical History  Diagnosis Date  . S/P CABG x 3 07/09/2014  . Hypothyroidism 07/09/2014  . Gout 07/09/2014  . CAD (coronary artery disease)   . Hyperlipidemia   . GERD (gastroesophageal reflux disease)   . Depression   . Anxiety   . Fibromyalgia   . Cervical strain 11/21/2014  . Cervicogenic headache 11/21/2014    Past Surgical History  Procedure Laterality Date  . Cervical fusion    . Knee surgery    . Back surgery    . Coronary artery bypass graft  March 2015    Performed in [place]  . Rotator cuff surgery    . Cataract extraction Bilateral     There were no vitals filed for this visit.  Visit Diagnosis:  Stiffness of neck  Pain, neck  Tension-type headache, not intractable, unspecified chronicity pattern  Muscle spasms of neck      Subjective Assessment - 12/12/14 0933    Subjective Pt reports that he was in more pain when he left therapy - went home and used massager to "grind into" the muscles in his neck and back of his head "as much as he could stand" . He has felt much better since then - with relief of HA and foggy feeling. Was so sore afterwards that he could not use the massager again since.    Currently in Pain? Yes   Pain Score 5    Pain Location Neck   Pain Orientation Left;Right   Pain Descriptors / Indicators Shooting   Pain Type Chronic pain   Pain Radiating Towards up back of head   Pain Onset More than a month ago    Pain Frequency Intermittent                         [facility] Adult PT Treatment/Exercise - 12/12/14 0001    Neck Exercises: Standing   Other Standing Exercises doorway stretch 3 rpositioins 20-30 sec hold 2 reps    Neck Exercises: Seated   Cervical Rotation 15 reps  each direction   Neck Exercises: Supine   Neck Retraction 10 reps;10 secs   Neck Retraction Limitations gentle/neutral spine supported on pillow   Shoulder Flexion Both;10 reps  with yellow band, on 1/2 foam roll    Upper Extremity D2 Flexion;10 reps;Theraband  on 1/2 foam roll   Theraband Level (UE D2) Level 1 (Yellow)   Other Supine Exercise Hooklying on 1/2 foam roll (head supported on pillow) x 10 snow angels, then arms in 90 deg horizontal abd for 1 min x 2 reps,    Other Supine Exercise Hooklying: bilateral shoulder ER with yellow band x 10 reps x 2 sets; thoracic lifts with 3 sec hold x 10 reps    Modalities   Modalities Electrical Stimulation;Moist Heat   Cryotherapy   Number Minutes Cryotherapy 15 Minutes  Cryotherapy Location Cervical   Type of Cryotherapy Ice pack   Electrical Stimulation   Electrical Stimulation Location cervical paraspinals   Electrical Stimulation Action IFC   Electrical Stimulation Parameters to tolerance   Electrical Stimulation Goals Pain   Manual Therapy   Manual Therapy Soft tissue mobilization;Myofascial release   Myofascial Release to anterior neck musculature, platysma, scalenes; suboccipital release   Manual Traction manual cervical traction 20-30 sec hold 5-6 reps                PT Education - 12/12/14 1017    Education provided Yes   Education Details Encouraged paitent to countinue working on exercises for home and use of masager for home as tolerated   Person(s) Educated Patient   Methods Explanation   Comprehension Verbalized understanding          PT Short Term Goals - 12/02/14 1058    PT SHORT TERM GOAL #1   Title I with initial HEP  ( 12/23/14)   Time 3   Period Weeks   Status New   PT SHORT TERM GOAL #2   Title improve cervical ROM by 10 degrees in all motions ( 12/23/14)    Time 3   Period Weeks   Status New   PT SHORT TERM GOAL #3   Title assess shoulder and upper back strength ( 12/23/14)    Time 3   Period Weeks   Status New   PT SHORT TERM GOAL #4   Title reports pain decrease =/> 25% overall ( 12/23/14)   Time 3   Period Weeks   Status New           PT Long Term Goals - 12/02/14 1059    PT LONG TERM GOAL #1   Title I with advanced HEP ( 01/13/15)    Time 6   Period Weeks   Status New   PT LONG TERM GOAL #2   Title demo cervical ROM WFL and pain =/< 2/10 ( 01/13/15)    Time 6   Period Weeks   Status New   PT LONG TERM GOAL #3   Title demo shoulder and upper back strength =/> 5-/5 ( 01/13/15)    Time 6   Period Weeks   Status New   PT LONG TERM GOAL #4   Title tolerate driving =/> 4 hrs before needing a bredak to allow return to work  (01/13/15)   Time 6   Period Weeks   Status New   PT LONG TERM GOAL #5   Title report ability to sleep per his prior level ( 01/13/15)    Time 6   Period Weeks   Status New   Additional Long Term Goals   Additional Long Term Goals Yes   PT LONG TERM GOAL #6   Title improve FOTO =/< 41% limited ( 01/13/15)    Time 6   Period Weeks   Status New               Plan - 12/12/14 1020    Clinical Impression Statement Signifacant improvement in c/o's and general appearance today. He responded well to his deep massage work at home. There is less muscular tightness to palpation; increased mobility; improved skin color/tone. Encouraged patient to continue with HEP and use of massager.    Pt will benefit from skilled therapeutic intervention in order to improve on the following deficits Pain;Postural dysfunction;Hypomobility;Increased muscle spasms;Decreased range of motion   Rehab Potential Good  PT Frequency 2x / week   PT Duration 6 weeks   PT  Treatment/Interventions Ultrasound;Neuromuscular re-education;Passive range of motion;Patient/family education;Cryotherapy;Electrical Stimulation;Moist Heat;Therapeutic exercise;Manual techniques;Taping   PT Next Visit Plan Continue progress strengthening and ROM, manual therapy, modalities to relieve pain. May try supine head presses into green therapy ball.    PT Home Exercise Plan trial of pec stretch to improve posture/upright position; trial of occipital inhibition with golf balls.    Consulted and Agree with Plan of Care Patient        Problem List Patient Active Problem List   Diagnosis Date Noted  . Cervical strain 11/21/2014  . Cervicogenic headache 11/21/2014  . Post concussion syndrome 11/07/2014  . Lumbar facet joint pain 07/25/2014  . CAD (coronary artery disease) 07/25/2014  . Vitamin D deficiency 07/25/2014  . Degenerative disc disease, cervical 07/25/2014  . S/P CABG x 3 07/09/2014  . Hypothyroidism 07/09/2014  . Hyperlipidemia 07/09/2014  . GERD (gastroesophageal reflux disease) 07/09/2014  . Gout 07/09/2014    Whyatt Klinger Nilda Simmer  PT, MPH  12/12/2014, 10:24 AM  [facility] [place], [name], [address] Phone: [phone]   Fax:  [phone]

## 2014-12-17 ENCOUNTER — Ambulatory Visit (INDEPENDENT_AMBULATORY_CARE_PROVIDER_SITE_OTHER): Payer: 59 | Admitting: Rehabilitative and Restorative Service Providers"

## 2014-12-17 ENCOUNTER — Encounter: Payer: Self-pay | Admitting: Rehabilitative and Restorative Service Providers"

## 2014-12-17 DIAGNOSIS — G44209 Tension-type headache, unspecified, not intractable: Secondary | ICD-10-CM | POA: Diagnosis not present

## 2014-12-17 DIAGNOSIS — M542 Cervicalgia: Secondary | ICD-10-CM

## 2014-12-17 DIAGNOSIS — M6248 Contracture of muscle, other site: Secondary | ICD-10-CM | POA: Diagnosis not present

## 2014-12-17 DIAGNOSIS — M436 Torticollis: Secondary | ICD-10-CM

## 2014-12-17 DIAGNOSIS — M62838 Other muscle spasm: Secondary | ICD-10-CM

## 2014-12-17 NOTE — Therapy (Signed)
[place], [name], [address] Phone: [phone]   Fax:  [phone]  Physical Therapy Treatment  Patient Details  Name: John Jefferson MRN: [id number] Date of Birth: 09/08/1955 Referring Provider:  Marcial Pacas, DO  Encounter Date: 12/17/2014      PT End of Session - 12/17/14 1257    Visit Number 7   Number of Visits 18   Date for PT Re-Evaluation 01/13/15   PT Start Time 8366   PT Stop Time 0939   PT Time Calculation (min) 52 min   Activity Tolerance Patient limited by pain      Past Medical History  Diagnosis Date  . S/P CABG x 3 07/09/2014  . Hypothyroidism 07/09/2014  . Gout 07/09/2014  . CAD (coronary artery disease)   . Hyperlipidemia   . GERD (gastroesophageal reflux disease)   . Depression   . Anxiety   . Fibromyalgia   . Cervical strain 11/21/2014  . Cervicogenic headache 11/21/2014    Past Surgical History  Procedure Laterality Date  . Cervical fusion    . Knee surgery    . Back surgery    . Coronary artery bypass graft  March 2015    Performed in [place]  . Rotator cuff surgery    . Cataract extraction Bilateral     There were no vitals filed for this visit.  Visit Diagnosis:  Stiffness of neck  Pain, neck  Tension-type headache, not intractable, unspecified chronicity pattern  Muscle spasms of neck      Subjective Assessment - 12/17/14 0846    Subjective Pt reports that he had a bad weekend - travel for a wedding,  riding/driving 7 hours - noted increased pain and discomfort. Seems he has leveled off with no more improvement since last week.   Pain Score 5    Pain Location Neck   Pain Orientation Left;Right   Pain Descriptors / Indicators Shooting   Pain Type Chronic pain   Pain Radiating Towards head                         [facility] Adult PT Treatment/Exercise - 12/17/14 0001    Neck Exercises: Machines for Strengthening   UBE (Upper Arm  Bike) L3 1 min forward/1 min back 2 reps each   Neck Exercises: Standing   Other Standing Exercises doorway stretch 3 rpositioins 20-30 sec hold 2 reps    Neck Exercises: Supine   Neck Retraction 10 reps;10 secs   Neck Retraction Limitations gentle/neutral spine supported on pillow   Shoulder Flexion Both;10 reps  with yellow band, on 1/2 foam roll    Upper Extremity D2 Flexion;10 reps;Theraband  on 1/2 foam roll   Theraband Level (UE D2) Level 1 (Yellow)   Modalities   Modalities Electrical Stimulation;Moist Heat   Moist Heat Therapy   Number Minutes Moist Heat 15 Minutes   Moist Heat Location Cervical   Electrical Stimulation   Electrical Stimulation Location cervical paraspinals   Electrical Stimulation Action IFC   Electrical Stimulation Parameters to tolerance   Electrical Stimulation Goals Pain   Manual Therapy   Manual Therapy Soft tissue mobilization;Myofascial release   Soft tissue mobilization to bilat upper traps, levator, cervical paraspinals    Myofascial Release to anterior neck musculature, platysma, scalenes; suboccipital release   Manual Traction manual cervical traction 20-30 sec hold 5-6 reps  PT Short Term Goals - 12/17/14 1302    PT SHORT TERM GOAL #1   Title I with initial HEP ( 12/23/14)   Time 3   Period Weeks   Status Achieved   PT SHORT TERM GOAL #2   Title improve cervical ROM by 10 degrees in all motions ( 12/23/14)    Time 3   Period Weeks   Status On-going   PT SHORT TERM GOAL #3   Title assess shoulder and upper back strength ( 12/23/14)    Time 3   Period Weeks   Status On-going   PT SHORT TERM GOAL #4   Title reports pain decrease =/> 25% overall ( 12/23/14)   Time 3   Period Weeks   Status On-going           PT Long Term Goals - 12/17/14 1303    PT LONG TERM GOAL #1   Title I with advanced HEP ( 01/13/15)    Time 6   Period Weeks   Status On-going   PT LONG TERM GOAL #2   Title demo cervical ROM WFL  and pain =/< 2/10 ( 01/13/15)    Time 6   Period Weeks   Status On-going   PT LONG TERM GOAL #3   Title demo shoulder and upper back strength =/> 5-/5 ( 01/13/15)    Time 6   Period Weeks   Status On-going   PT LONG TERM GOAL #4   Title tolerate driving =/> 4 hrs before needing a bredak to allow return to work  (01/13/15)   Time 6   Period Weeks   Status On-going   PT LONG TERM GOAL #5   Title report ability to sleep per his prior level ( 01/13/15)    Time 6   Period Weeks   Status On-going   PT LONG TERM GOAL #6   Title improve FOTO =/< 41% limited ( 01/13/15)    Time 6   Period Weeks   Status On-going               Plan - 12/17/14 1258    Clinical Impression Statement Patient reports flare up in HA and neck pain today likely related to riding/driving 7 hours 2 times to travel to a wedding this weekend. Different activity level and bed/chairs/etc.. Some improvement in HA post treatment and neck feels looser.    Pt will benefit from skilled therapeutic intervention in order to improve on the following deficits Pain;Postural dysfunction;Hypomobility;Increased muscle spasms;Decreased range of motion   Rehab Potential Good   Clinical Impairments Affecting Rehab Potential C4-7 fusion, fibromyalgia   PT Frequency 2x / week   PT Duration 6 weeks   PT Treatment/Interventions Ultrasound;Neuromuscular re-education;Passive range of motion;Patient/family education;Cryotherapy;Electrical Stimulation;Moist Heat;Therapeutic exercise;Manual techniques;Taping   PT Next Visit Plan Trial of [place] for cervical tightness. Continue with manual therapy, postural correction   PT Home Exercise Plan continue pec stretch to improve posture/upright position; trial of occipital inhibition with golf balls; gentle exercise with head supported for most of exercises   Consulted and Agree with Plan of Care Patient        Problem List Patient Active Problem List   Diagnosis Date Noted  . Cervical strain  11/21/2014  . Cervicogenic headache 11/21/2014  . Post concussion syndrome 11/07/2014  . Lumbar facet joint pain 07/25/2014  . CAD (coronary artery disease) 07/25/2014  . Vitamin D deficiency 07/25/2014  . Degenerative disc disease, cervical 07/25/2014  . S/P CABG  x 3 07/09/2014  . Hypothyroidism 07/09/2014  . Hyperlipidemia 07/09/2014  . GERD (gastroesophageal reflux disease) 07/09/2014  . Gout 07/09/2014    Celyn Nilda Simmer PT, MPH 12/17/2014, 1:05 PM  [facility] [place], [name], [address] Phone: [phone]   Fax:  [phone]

## 2014-12-18 ENCOUNTER — Ambulatory Visit (INDEPENDENT_AMBULATORY_CARE_PROVIDER_SITE_OTHER): Payer: 59 | Admitting: Rehabilitative and Restorative Service Providers"

## 2014-12-18 ENCOUNTER — Encounter: Payer: Self-pay | Admitting: Rehabilitative and Restorative Service Providers"

## 2014-12-18 DIAGNOSIS — M6248 Contracture of muscle, other site: Secondary | ICD-10-CM | POA: Diagnosis not present

## 2014-12-18 DIAGNOSIS — M542 Cervicalgia: Secondary | ICD-10-CM

## 2014-12-18 DIAGNOSIS — M62838 Other muscle spasm: Secondary | ICD-10-CM

## 2014-12-18 DIAGNOSIS — M436 Torticollis: Secondary | ICD-10-CM | POA: Diagnosis not present

## 2014-12-18 DIAGNOSIS — G44209 Tension-type headache, unspecified, not intractable: Secondary | ICD-10-CM | POA: Diagnosis not present

## 2014-12-18 NOTE — Therapy (Signed)
[facility] [place], [name], [address] Phone: [phone]   Fax:  [phone]  Physical Therapy Treatment  Patient Details  Name: John Jefferson MRN: [id number] Date of Birth: 1955/08/11 Referring Provider:  Marcial Pacas, DO  Encounter Date: 12/18/2014      PT End of Session - 12/18/14 1046    Visit Number 8   Number of Visits 18   Date for PT Re-Evaluation 01/13/15   PT Start Time 0849   PT Stop Time 0940   PT Time Calculation (min) 51 min   Activity Tolerance Patient limited by pain      Past Medical History  Diagnosis Date  . S/P CABG x 3 07/09/2014  . Hypothyroidism 07/09/2014  . Gout 07/09/2014  . CAD (coronary artery disease)   . Hyperlipidemia   . GERD (gastroesophageal reflux disease)   . Depression   . Anxiety   . Fibromyalgia   . Cervical strain 11/21/2014  . Cervicogenic headache 11/21/2014    Past Surgical History  Procedure Laterality Date  . Cervical fusion    . Knee surgery    . Back surgery    . Coronary artery bypass graft  March 2015    Performed in [place]  . Rotator cuff surgery    . Cataract extraction Bilateral     There were no vitals filed for this visit.  Visit Diagnosis:  Stiffness of neck  Pain, neck  Tension-type headache, not intractable, unspecified chronicity pattern  Muscle spasms of neck      Subjective Assessment - 12/18/14 0853    Subjective Some better today since he is back at home. Still has pulling in the neck but HA is gone for the most part.   Currently in Pain? Yes   Pain Score 5    Pain Location Neck   Pain Orientation Left;Right   Pain Descriptors / Indicators Aching  pulling            [facility] PT Assessment - 12/18/14 0001    AROM   Cervical Flexion 27   Cervical Extension 28   Cervical - Right Side Bend 12   Cervical - Left Side Bend 10   Cervical - Right Rotation 45   Cervical - Left Rotation 22  with pain                      [facility] Adult PT Treatment/Exercise - 12/18/14 0001    Neck Exercises: Standing   Other Standing Exercises doorway stretch 3 rpositioins 20-30 sec hold 2 reps    Neck Exercises: Supine   Neck Retraction 10 reps;10 secs   Neck Retraction Limitations gentle/neutral spine supported on pillow   Other Supine Exercise rotation in supine 5 reps    Moist Heat Therapy   Number Minutes Moist Heat 15 Minutes   Moist Heat Location Cervical  thoracic spine   Ultrasound   Ultrasound Location bilateral cervical and occipital musculature   Ultrasound Parameters 1 mHz; 1.5 w/cm2; 100% 6 min each side   Ultrasound Goals Pain  muscule tightness   Manual Therapy   Manual Therapy Soft tissue mobilization;Myofascial release   Soft tissue mobilization to bilat upper traps, levator, cervical paraspinals    Myofascial Release to anterior neck musculature, platysma, scalenes; suboccipital release   Manual Traction manual cervical traction 20-30 sec hold 5-6 reps                  PT Short  Term Goals - 12/17/14 1302    PT SHORT TERM GOAL #1   Title I with initial HEP ( 12/23/14)   Time 3   Period Weeks   Status Achieved   PT SHORT TERM GOAL #2   Title improve cervical ROM by 10 degrees in all motions ( 12/23/14)    Time 3   Period Weeks   Status On-going   PT SHORT TERM GOAL #3   Title assess shoulder and upper back strength ( 12/23/14)    Time 3   Period Weeks   Status On-going   PT SHORT TERM GOAL #4   Title reports pain decrease =/> 25% overall ( 12/23/14)   Time 3   Period Weeks   Status On-going           PT Long Term Goals - 12/17/14 1303    PT LONG TERM GOAL #1   Title I with advanced HEP ( 01/13/15)    Time 6   Period Weeks   Status On-going   PT LONG TERM GOAL #2   Title demo cervical ROM WFL and pain =/< 2/10 ( 01/13/15)    Time 6   Period Weeks   Status On-going   PT LONG TERM GOAL #3   Title demo shoulder and upper back strength =/>  5-/5 ( 01/13/15)    Time 6   Period Weeks   Status On-going   PT LONG TERM GOAL #4   Title tolerate driving =/> 4 hrs before needing a bredak to allow return to work  (01/13/15)   Time 6   Period Weeks   Status On-going   PT LONG TERM GOAL #5   Title report ability to sleep per his prior level ( 01/13/15)    Time 6   Period Weeks   Status On-going   PT LONG TERM GOAL #6   Title improve FOTO =/< 41% limited ( 01/13/15)    Time 6   Period Weeks   Status On-going               Plan - 12/18/14 1046    Clinical Impression Statement Some improvement in cervical pain and HA reported today. Mikki Santee has less pain and tightness today. He demonstrates some improved cervical mobility in some planes - not others. Mikki Santee will continue with postural exercise and UE exercises at home, will focus on [place] and STM/manual work to assess response in effort to address chronic pain component of current symptoms. Bob's lumbar and hip dysfunction contribute to current limitations    Pt will benefit from skilled therapeutic intervention in order to improve on the following deficits Pain;Postural dysfunction;Hypomobility;Increased muscle spasms;Decreased range of motion   Rehab Potential Good   Clinical Impairments Affecting Rehab Potential C4-7 fusion, fibromyalgia, lumbar fusioin, hip pain/dysfunction   PT Frequency 2x / week   PT Duration 6 weeks   PT Treatment/Interventions Ultrasound;Neuromuscular re-education;Passive range of motion;Patient/family education;Cryotherapy;Electrical Stimulation;Moist Heat;Therapeutic exercise;Manual techniques;Taping   PT Next Visit Plan [place] for cervical tightness. Continue with manual therapy, postural correction   PT Home Exercise Plan continue pec stretch to improve posture/upright position; trial of occipital inhibition with golf balls; gentle exercise with head supported for most of exercises   Consulted and Agree with Plan of Care Patient        Problem List Patient  Active Problem List   Diagnosis Date Noted  . Cervical strain 11/21/2014  . Cervicogenic headache 11/21/2014  . Post concussion syndrome 11/07/2014  . Lumbar  facet joint pain 07/25/2014  . CAD (coronary artery disease) 07/25/2014  . Vitamin D deficiency 07/25/2014  . Degenerative disc disease, cervical 07/25/2014  . S/P CABG x 3 07/09/2014  . Hypothyroidism 07/09/2014  . Hyperlipidemia 07/09/2014  . GERD (gastroesophageal reflux disease) 07/09/2014  . Gout 07/09/2014    Jaelin Fackler Nilda Simmer PT, MPH 12/18/2014, 10:52 AM  [facility] [place], [name], [address] Phone: [phone]   Fax:  [phone]

## 2014-12-20 ENCOUNTER — Ambulatory Visit (INDEPENDENT_AMBULATORY_CARE_PROVIDER_SITE_OTHER): Payer: 59 | Admitting: Physical Therapy

## 2014-12-20 DIAGNOSIS — M436 Torticollis: Secondary | ICD-10-CM | POA: Diagnosis not present

## 2014-12-20 DIAGNOSIS — G44209 Tension-type headache, unspecified, not intractable: Secondary | ICD-10-CM

## 2014-12-20 DIAGNOSIS — M6248 Contracture of muscle, other site: Secondary | ICD-10-CM

## 2014-12-20 DIAGNOSIS — M542 Cervicalgia: Secondary | ICD-10-CM | POA: Diagnosis not present

## 2014-12-20 DIAGNOSIS — M62838 Other muscle spasm: Secondary | ICD-10-CM

## 2014-12-20 NOTE — Therapy (Signed)
[place], [name], [address] Phone: [phone]   Fax:  [phone]  Physical Therapy Treatment  Patient Details  Name: John Jefferson MRN: [id number] Date of Birth: 07/16/1955 Referring Provider:  Gregor Hams, MD  Encounter Date: 12/20/2014      PT End of Session - 12/20/14 0937    Visit Number 9   Number of Visits 18   Date for PT Re-Evaluation 01/13/15   PT Start Time 0935   PT Stop Time 1031   PT Time Calculation (min) 56 min      Past Medical History  Diagnosis Date  . S/P CABG x 3 07/09/2014  . Hypothyroidism 07/09/2014  . Gout 07/09/2014  . CAD (coronary artery disease)   . Hyperlipidemia   . GERD (gastroesophageal reflux disease)   . Depression   . Anxiety   . Fibromyalgia   . Cervical strain 11/21/2014  . Cervicogenic headache 11/21/2014    Past Surgical History  Procedure Laterality Date  . Cervical fusion    . Knee surgery    . Back surgery    . Coronary artery bypass graft  March 2015    Performed in [place]  . Rotator cuff surgery    . Cataract extraction Bilateral     There were no vitals filed for this visit.  Visit Diagnosis:  Stiffness of neck  Pain, neck  Tension-type headache, not intractable, unspecified chronicity pattern  Muscle spasms of neck      Subjective Assessment - 12/20/14 0937    Subjective Pt reports he feels he is moving a little better than before.  Still has pulling sensation in neck.  No headache today, but "yesterday was terrible".    Currently in Pain? Yes   Pain Score 4    Pain Location Neck   Pain Orientation Right;Left   Pain Descriptors / Indicators Aching   Aggravating Factors  moving head past point of comfort   Pain Relieving Factors laying down.             [facility] PT Assessment - 12/20/14 0001    Assessment   Medical Diagnosis cervical spasm   Onset Date/Surgical Date 10/08/14   Hand Dominance Right   Next MD  Visit 12/30/14   Prior Therapy no PT only chiropractic care                     [facility] Adult PT Treatment/Exercise - 12/20/14 0001    Neck Exercises: Machines for Strengthening   UBE (Upper Arm Bike) L2: 1 min forward and backward, x 2 reps    Neck Exercises: Seated   Cervical Rotation 15 reps  each direction   Neck Exercises: Supine   Cervical Isometrics Left lateral flexion;Left rotation;3 secs;10 reps   Shoulder Flexion Both;15 reps  red band   Upper Extremity D2 Flexion;10 reps;Theraband   Theraband Level (UE D2) Level 2 (Red)   Other Supine Exercise Hooklying on 1/2 foam roll (head supported on pillow) x 10 snow angels, then arms in 90 deg horizontal abd for 1 min x 2 reps,    Other Supine Exercise Hooklying on table:  Horizontal ABD with red band x 10   Moist Heat Therapy   Number Minutes Moist Heat 15 Minutes   Moist Heat Location Cervical   Electrical Stimulation   Electrical Stimulation Location cervical paraspinals    Electrical Stimulation Action IFC   Electrical Stimulation Parameters to tolerance x 15 min  Electrical Stimulation Goals Pain   Ultrasound   Ultrasound Location bilateral cervical paraspinals    Ultrasound Parameters 100%, 1.4 w/cm2, 1.41mHz, x 4 min each side    Ultrasound Goals Pain                  PT Short Term Goals - 12/17/14 1302    PT SHORT TERM GOAL #1   Title I with initial HEP ( 12/23/14)   Time 3   Period Weeks   Status Achieved   PT SHORT TERM GOAL #2   Title improve cervical ROM by 10 degrees in all motions ( 12/23/14)    Time 3   Period Weeks   Status On-going   PT SHORT TERM GOAL #3   Title assess shoulder and upper back strength ( 12/23/14)    Time 3   Period Weeks   Status On-going   PT SHORT TERM GOAL #4   Title reports pain decrease =/> 25% overall ( 12/23/14)   Time 3   Period Weeks   Status On-going           PT Long Term Goals - 12/17/14 1303    PT LONG TERM GOAL #1   Title I with advanced  HEP ( 01/13/15)    Time 6   Period Weeks   Status On-going   PT LONG TERM GOAL #2   Title demo cervical ROM WFL and pain =/< 2/10 ( 01/13/15)    Time 6   Period Weeks   Status On-going   PT LONG TERM GOAL #3   Title demo shoulder and upper back strength =/> 5-/5 ( 01/13/15)    Time 6   Period Weeks   Status On-going   PT LONG TERM GOAL #4   Title tolerate driving =/> 4 hrs before needing a bredak to allow return to work  (01/13/15)   Time 6   Period Weeks   Status On-going   PT LONG TERM GOAL #5   Title report ability to sleep per his prior level ( 01/13/15)    Time 6   Period Weeks   Status On-going   PT LONG TERM GOAL #6   Title improve FOTO =/< 41% limited ( 01/13/15)    Time 6   Period Weeks   Status On-going               Plan - 12/20/14 1138    Clinical Impression Statement Pt tolerated increased resistance with shoulder exercises as well as introduction of cervical isometrics without increase in neck pain.  Pt reported decreased neck stiffness and pain at conclusion of treatment.  Progressing towards established goals.     Pt will benefit from skilled therapeutic intervention in order to improve on the following deficits Pain;Postural dysfunction;Hypomobility;Increased muscle spasms;Decreased range of motion   Rehab Potential Good   Clinical Impairments Affecting Rehab Potential C4-7 fusion, fibromyalgia, lumbar fusioin, hip pain/dysfunction   PT Frequency 2x / week   PT Duration 6 weeks   PT Treatment/Interventions Ultrasound;Neuromuscular re-education;Passive range of motion;Patient/family education;Cryotherapy;Electrical Stimulation;Moist Heat;Therapeutic exercise;Manual techniques;Taping   PT Next Visit Plan [place] for cervical tightness. Continue with manual therapy, postural correction   Consulted and Agree with Plan of Care Patient        Problem List Patient Active Problem List   Diagnosis Date Noted  . Cervical strain 11/21/2014  . Cervicogenic headache  11/21/2014  . Post concussion syndrome 11/07/2014  . Lumbar facet joint pain 07/25/2014  . CAD (  coronary artery disease) 07/25/2014  . Vitamin D deficiency 07/25/2014  . Degenerative disc disease, cervical 07/25/2014  . S/P CABG x 3 07/09/2014  . Hypothyroidism 07/09/2014  . Hyperlipidemia 07/09/2014  . GERD (gastroesophageal reflux disease) 07/09/2014  . Gout 07/09/2014    Kerin Perna, PTA 12/20/2014 11:41 AM  [facility] [place], [name], [address] Phone: [phone]   Fax:  [phone]

## 2014-12-23 ENCOUNTER — Encounter: Payer: Self-pay | Admitting: Rehabilitative and Restorative Service Providers"

## 2014-12-23 ENCOUNTER — Ambulatory Visit (INDEPENDENT_AMBULATORY_CARE_PROVIDER_SITE_OTHER): Payer: 59 | Admitting: Rehabilitative and Restorative Service Providers"

## 2014-12-23 DIAGNOSIS — M542 Cervicalgia: Secondary | ICD-10-CM | POA: Diagnosis not present

## 2014-12-23 DIAGNOSIS — M6248 Contracture of muscle, other site: Secondary | ICD-10-CM

## 2014-12-23 DIAGNOSIS — M62838 Other muscle spasm: Secondary | ICD-10-CM

## 2014-12-23 DIAGNOSIS — G44209 Tension-type headache, unspecified, not intractable: Secondary | ICD-10-CM | POA: Diagnosis not present

## 2014-12-23 DIAGNOSIS — M436 Torticollis: Secondary | ICD-10-CM
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
## 2014-12-25 ENCOUNTER — Ambulatory Visit (INDEPENDENT_AMBULATORY_CARE_PROVIDER_SITE_OTHER): Payer: 59 | Admitting: Rehabilitative and Restorative Service Providers"

## 2014-12-25 ENCOUNTER — Encounter: Payer: Self-pay | Admitting: Rehabilitative and Restorative Service Providers"

## 2014-12-25 DIAGNOSIS — G44209 Tension-type headache, unspecified, not intractable: Secondary | ICD-10-CM

## 2014-12-25 DIAGNOSIS — M436 Torticollis: Secondary | ICD-10-CM | POA: Diagnosis not present

## 2014-12-25 DIAGNOSIS — M542 Cervicalgia: Secondary | ICD-10-CM | POA: Diagnosis not present

## 2014-12-25 DIAGNOSIS — M62838 Other muscle spasm: Secondary | ICD-10-CM

## 2014-12-25 DIAGNOSIS — M6248 Contracture of muscle, other site: Secondary | ICD-10-CM
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
## 2014-12-27 ENCOUNTER — Ambulatory Visit (INDEPENDENT_AMBULATORY_CARE_PROVIDER_SITE_OTHER): Payer: 59 | Admitting: Rehabilitative and Restorative Service Providers"

## 2014-12-27 DIAGNOSIS — M6248 Contracture of muscle, other site: Secondary | ICD-10-CM | POA: Diagnosis not present

## 2014-12-27 DIAGNOSIS — M542 Cervicalgia: Secondary | ICD-10-CM

## 2014-12-27 DIAGNOSIS — M436 Torticollis: Secondary | ICD-10-CM

## 2014-12-27 DIAGNOSIS — G44209 Tension-type headache, unspecified, not intractable: Secondary | ICD-10-CM

## 2014-12-27 DIAGNOSIS — M62838 Other muscle spasm: Secondary | ICD-10-CM
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
## 2014-12-30 ENCOUNTER — Ambulatory Visit (INDEPENDENT_AMBULATORY_CARE_PROVIDER_SITE_OTHER): Payer: 59 | Admitting: Physical Therapy

## 2014-12-30 ENCOUNTER — Encounter: Payer: Self-pay | Admitting: Family Medicine

## 2014-12-30 ENCOUNTER — Ambulatory Visit (INDEPENDENT_AMBULATORY_CARE_PROVIDER_SITE_OTHER): Payer: 59 | Admitting: Family Medicine

## 2014-12-30 VITALS — BP 135/85 | HR 63 | Temp 98.0°F | Wt 213.0 lb

## 2014-12-30 DIAGNOSIS — Z23 Encounter for immunization: Secondary | ICD-10-CM

## 2014-12-30 DIAGNOSIS — F0781 Postconcussional syndrome: Secondary | ICD-10-CM

## 2014-12-30 DIAGNOSIS — G44209 Tension-type headache, unspecified, not intractable: Secondary | ICD-10-CM | POA: Diagnosis not present

## 2014-12-30 DIAGNOSIS — M436 Torticollis: Secondary | ICD-10-CM

## 2014-12-30 DIAGNOSIS — M6248 Contracture of muscle, other site: Secondary | ICD-10-CM | POA: Diagnosis not present

## 2014-12-30 DIAGNOSIS — S161XXD Strain of muscle, fascia and tendon at neck level, subsequent encounter: Secondary | ICD-10-CM

## 2014-12-30 DIAGNOSIS — M542 Cervicalgia: Secondary | ICD-10-CM | POA: Diagnosis not present

## 2014-12-30 DIAGNOSIS — M62838 Other muscle spasm: Secondary | ICD-10-CM

## 2014-12-30 MED ORDER — GABAPENTIN 300 MG PO CAPS: 600.0000 mg | ORAL_CAPSULE | Freq: Three times a day (TID) | ORAL | Status: DC

## 2014-12-30 NOTE — Assessment & Plan Note (Signed)
Improved but not yet at goal. Increase gabapentin to 600 mg 3 times daily. Continue current dose of amantadine. Return in one month. Work note for 1 more month.

## 2014-12-30 NOTE — Assessment & Plan Note (Signed)
Continue physical therapy. Recheck in one month
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
## 2014-12-30 NOTE — Patient Instructions (Signed)
Thank you for coming in today. Continue dose of amanatidine.  Increase gabapentin to 2 pills three times daily.  Return in 1 month.  Continue physical therapy.   Concussion A concussion, or closed-head injury, is a brain injury caused by a direct blow to the head or by a quick and sudden movement (jolt) of the head or neck. Concussions are usually not life-threatening. Even so, the effects of a concussion can be serious. If you have had a concussion before, you are more likely to experience concussion-like symptoms after a direct blow to the head.  CAUSES  Direct blow to the head, such as from running into another player during a soccer game, being hit in a fight, or hitting your head on a hard surface.  A jolt of the head or neck that causes the brain to move back and forth inside the skull, such as in a car crash. SIGNS AND SYMPTOMS The signs of a concussion can be hard to notice. Early on, they may be missed by you, family members, and health care providers. You may look fine but act or feel differently. Symptoms are usually temporary, but they may last for days, weeks, or even longer. Some symptoms may appear right away while others may not show up for hours or days. Every head injury is different. Symptoms include:  Mild to moderate headaches that will not go away.  A feeling of pressure inside your head.  Having more trouble than usual:  Learning or remembering things you have heard.  Answering questions.  Paying attention or concentrating.  Organizing daily tasks.  Making decisions and solving problems.  Slowness in thinking, acting or reacting, speaking, or reading.  Getting lost or being easily confused.  Feeling tired all the time or lacking energy (fatigued).  Feeling drowsy.  Sleep disturbances.  Sleeping more than usual.  Sleeping less than usual.  Trouble falling asleep.  Trouble sleeping (insomnia).  Loss of balance or feeling lightheaded or  dizzy.  Nausea or vomiting.  Numbness or tingling.  Increased sensitivity to:  Sounds.  Lights.  Distractions.  Vision problems or eyes that tire easily.  Diminished sense of taste or smell.  Ringing in the ears.  Mood changes such as feeling sad or anxious.  Becoming easily irritated or angry for little or no reason.  Lack of motivation.  Seeing or hearing things other people do not see or hear (hallucinations). DIAGNOSIS Your health care provider can usually diagnose a concussion based on a description of your injury and symptoms. He or she will ask whether you passed out (lost consciousness) and whether you are having trouble remembering events that happened right before and during your injury. Your evaluation might include:  A brain scan to look for signs of injury to the brain. Even if the test shows no injury, you may still have a concussion.  Blood tests to be sure other problems are not present. TREATMENT  Concussions are usually treated in an emergency department, in urgent care, or at a clinic. You may need to stay in the hospital overnight for further treatment.  Tell your health care provider if you are taking any medicines, including prescription medicines, over-the-counter medicines, and natural remedies. Some medicines, such as blood thinners (anticoagulants) and aspirin, may increase the chance of complications. Also tell your health care provider whether you have had alcohol or are taking illegal drugs. This information may affect treatment.  Your health care provider will send you home with important instructions to  follow.  How fast you will recover from a concussion depends on many factors. These factors include how severe your concussion is, what part of your brain was injured, your age, and how healthy you were before the concussion.  Most people with mild injuries recover fully. Recovery can take time. In general, recovery is slower in older persons.  Also, persons who have had a concussion in the past or have other medical problems may find that it takes longer to recover from their current injury. HOME CARE INSTRUCTIONS General Instructions  Carefully follow the directions your health care provider gave you.  Only take over-the-counter or prescription medicines for pain, discomfort, or fever as directed by your health care provider.  Take only those medicines that your health care provider has approved.  Do not drink alcohol until your health care provider says you are well enough to do so. Alcohol and certain other drugs may slow your recovery and can put you at risk of further injury.  If it is harder than usual to remember things, write them down.  If you are easily distracted, try to do one thing at a time. For example, do not try to watch TV while fixing dinner.  Talk with family members or close friends when making important decisions.  Keep all follow-up appointments. Repeated evaluation of your symptoms is recommended for your recovery.  Watch your symptoms and tell others to do the same. Complications sometimes occur after a concussion. Older adults with a brain injury may have a higher risk of serious complications, such as a blood clot on the brain.  Tell your teachers, school nurse, school counselor, coach, athletic trainer, or work Freight forwarder about your injury, symptoms, and restrictions. Tell them about what you can or cannot do. They should watch for:  Increased problems with attention or concentration.  Increased difficulty remembering or learning new information.  Increased time needed to complete tasks or assignments.  Increased irritability or decreased ability to cope with stress.  Increased symptoms.  Rest. Rest helps the brain to heal. Make sure you:  Get plenty of sleep at night. Avoid staying up late at night.  Keep the same bedtime hours on weekends and weekdays.  Rest during the day. Take daytime  naps or rest breaks when you feel tired.  Limit activities that require a lot of thought or concentration. These include:  Doing homework or job-related work.  Watching TV.  Working on the computer.  Avoid any situation where there is potential for another head injury (football, hockey, soccer, basketball, martial arts, downhill snow sports and horseback riding). Your condition will get worse every time you experience a concussion. You should avoid these activities until you are evaluated by the appropriate follow-up health care providers. Returning To Your Regular Activities You will need to return to your normal activities slowly, not all at once. You must give your body and brain enough time for recovery.  Do not return to sports or other athletic activities until your health care provider tells you it is safe to do so.  Ask your health care provider when you can drive, ride a bicycle, or operate heavy machinery. Your ability to react may be slower after a brain injury. Never do these activities if you are dizzy.  Ask your health care provider about when you can return to work or school. Preventing Another Concussion It is very important to avoid another brain injury, especially before you have recovered. In rare cases, another injury can lead  to permanent brain damage, brain swelling, or death. The risk of this is greatest during the first 7-10 days after a head injury. Avoid injuries by:  Wearing a seat belt when riding in a car.  Drinking alcohol only in moderation.  Wearing a helmet when biking, skiing, skateboarding, skating, or doing similar activities.  Avoiding activities that could lead to a second concussion, such as contact or recreational sports, until your health care provider says it is okay.  Taking safety measures in your home.  Remove clutter and tripping hazards from floors and stairways.  Use grab bars in bathrooms and handrails by stairs.  Place non-slip  mats on floors and in bathtubs.  Improve lighting in dim areas. SEEK MEDICAL CARE IF:  You have increased problems paying attention or concentrating.  You have increased difficulty remembering or learning new information.  You need more time to complete tasks or assignments than before.  You have increased irritability or decreased ability to cope with stress.  You have more symptoms than before. Seek medical care if you have any of the following symptoms for more than 2 weeks after your injury:  Lasting (chronic) headaches.  Dizziness or balance problems.  Nausea.  Vision problems.  Increased sensitivity to noise or light.  Depression or mood swings.  Anxiety or irritability.  Memory problems.  Difficulty concentrating or paying attention.  Sleep problems.  Feeling tired all the time. SEEK IMMEDIATE MEDICAL CARE IF:  You have severe or worsening headaches. These may be a sign of a blood clot in the brain.  You have weakness (even if only in one hand, leg, or part of the face).  You have numbness.  You have decreased coordination.  You vomit repeatedly.  You have increased sleepiness.  One pupil is larger than the other.  You have convulsions.  You have slurred speech.  You have increased confusion. This may be a sign of a blood clot in the brain.  You have increased restlessness, agitation, or irritability.  You are unable to recognize people or places.  You have neck pain.  It is difficult to wake you up.  You have unusual behavior changes.  You lose consciousness. MAKE SURE YOU:  Understand these instructions.  Will watch your condition.  Will get help right away if you are not doing well or get worse. Document Released: 06/19/2003 Document Revised: 04/03/2013 Document Reviewed: 10/19/2012 [facility] Patient Information 2015 [place], [place]. This information is not intended to replace advice given to you by your health care provider. Make  sure you discuss any questions you have with your health care provider.
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
## 2015-01-01 ENCOUNTER — Ambulatory Visit (INDEPENDENT_AMBULATORY_CARE_PROVIDER_SITE_OTHER): Payer: 59 | Admitting: Rehabilitative and Restorative Service Providers"

## 2015-01-01 ENCOUNTER — Encounter: Payer: Self-pay | Admitting: Rehabilitative and Restorative Service Providers"

## 2015-01-01 DIAGNOSIS — M436 Torticollis: Secondary | ICD-10-CM

## 2015-01-01 DIAGNOSIS — M542 Cervicalgia: Secondary | ICD-10-CM | POA: Diagnosis not present

## 2015-01-01 DIAGNOSIS — M6248 Contracture of muscle, other site: Secondary | ICD-10-CM | POA: Diagnosis not present

## 2015-01-01 DIAGNOSIS — G44209 Tension-type headache, unspecified, not intractable: Secondary | ICD-10-CM

## 2015-01-01 DIAGNOSIS — M62838 Other muscle spasm: Secondary | ICD-10-CM

## 2015-01-01 NOTE — Therapy (Signed)
[facility] [place], [name], [address] Phone: [phone]   Fax:  [phone]  Physical Therapy Treatment  Patient Details  Name: John Jefferson MRN: [id number] Date of Birth: May 05, 1955 Referring Provider:  Gregor Hams, MD  Encounter Date: 01/01/2015      PT End of Session - 01/01/15 0810    Visit Number 14   Number of Visits 18   Date for PT Re-Evaluation 01/13/15   PT Start Time 0805   PT Stop Time 0901   PT Time Calculation (min) 56 min   Activity Tolerance Patient tolerated treatment well      Past Medical History  Diagnosis Date  . S/P CABG x 3 07/09/2014  . Hypothyroidism 07/09/2014  . Gout 07/09/2014  . CAD (coronary artery disease)   . Hyperlipidemia   . GERD (gastroesophageal reflux disease)   . Depression   . Anxiety   . Fibromyalgia   . Cervical strain 11/21/2014  . Cervicogenic headache 11/21/2014    Past Surgical History  Procedure Laterality Date  . Cervical fusion    . Knee surgery    . Back surgery    . Coronary artery bypass graft  March 2015    Performed in [place]  . Rotator cuff surgery    . Cataract extraction Bilateral     There were no vitals filed for this visit.  Visit Diagnosis:  Stiffness of neck  Pain, neck  Tension-type headache, not intractable, unspecified chronicity pattern  Muscle spasms of neck      Subjective Assessment - 01/01/15 0810    Subjective Sore today - maybe from increase in LBP which he has experienced on the past several days. Thinks the traction helps and wants to continue with trial of traction.    Currently in Pain? Yes   Pain Score 4    Pain Location Neck   Pain Orientation Right;Left   Pain Descriptors / Indicators Aching                         [facility] Adult PT Treatment/Exercise - 01/01/15 0001    Neck Exercises: Machines for Strengthening   UBE (Upper Arm Bike) L3: 2 min forward, 2 min backward; standing    Neck Exercises: Standing   Other Standing Exercises doorway stretch 3 rpositioins 20-30 sec hold 2 reps    Other Standing Exercises shoulder ER with red band x 10, 2 sets (with noodle between shoulder blades   Neck Exercises: Seated   Cervical Rotation 5 reps;Right;Left   Shoulder Shrugs 10 reps   Other Seated Exercise scap retraction x 10 reps    Neck Exercises: Supine   Neck Retraction 5 reps;10 secs   Moist Heat Therapy   Number Minutes Moist Heat 15 Minutes   Moist Heat Location Cervical   Ultrasound   Ultrasound Location bilat cervical musculature to occiput   Ultrasound Parameters 100%; 1 mHz; 1.4 w/cm2   Ultrasound Goals Pain  muscular tightness    Traction   Type of Traction Cervical   Min (lbs) 12   Max (lbs) 12   Hold Time Static   Time 15   Manual Therapy   Manual Therapy Soft tissue mobilization;Myofascial release   Soft tissue mobilization bilat upper traps, levator, cervical paraspinals    Myofascial Release anterior neck musculature, platysma, scalenes; suboccipital release                  PT  Short Term Goals - 12/23/14 1005    PT SHORT TERM GOAL #1   Title I with initial HEP ( 12/23/14)   Time 3   Period Weeks   Status Achieved   PT SHORT TERM GOAL #2   Title improve cervical ROM by 10 degrees in all motions ( 01/06/15)    Baseline some improvement noted - continued limitations   Time 5   Period Weeks   Status Revised   PT SHORT TERM GOAL #3   Title assess shoulder and upper back strength ( 01/06/15)    Time 5   Period Weeks   Status Revised   PT SHORT TERM GOAL #4   Title reports pain decrease =/> 25% overall ( 01/06/15)   Baseline at times - continues to experience variable pain intensity and frequency   Time 5   Period Weeks   Status Revised           PT Long Term Goals - 12/17/14 1303    PT LONG TERM GOAL #1   Title I with advanced HEP ( 01/13/15)    Time 6   Period Weeks   Status On-going   PT LONG TERM GOAL #2   Title  demo cervical ROM WFL and pain =/< 2/10 ( 01/13/15)    Time 6   Period Weeks   Status On-going   PT LONG TERM GOAL #3   Title demo shoulder and upper back strength =/> 5-/5 ( 01/13/15)    Time 6   Period Weeks   Status On-going   PT LONG TERM GOAL #4   Title tolerate driving =/> 4 hrs before needing a bredak to allow return to work  (01/13/15)   Time 6   Period Weeks   Status On-going   PT LONG TERM GOAL #5   Title report ability to sleep per his prior level ( 01/13/15)    Time 6   Period Weeks   Status On-going   PT LONG TERM GOAL #6   Title improve FOTO =/< 41% limited ( 01/13/15)    Time 6   Period Weeks   Status On-going               Plan - 01/01/15 1049    Clinical Impression Statement Pt feels the traction helps and the massage really helps. Reports that MD approved additional visits. Discussed possibility to going to an outpatient clinic that specializes in chronic pain with manual therapy focus.   Pt will benefit from skilled therapeutic intervention in order to improve on the following deficits Pain;Postural dysfunction;Hypomobility;Increased muscle spasms;Decreased range of motion   Rehab Potential Good        Problem List Patient Active Problem List   Diagnosis Date Noted  . Cervical strain 11/21/2014  . Cervicogenic headache 11/21/2014  . Post concussion syndrome 11/07/2014  . Lumbar facet joint pain 07/25/2014  . CAD (coronary artery disease) 07/25/2014  . Vitamin D deficiency 07/25/2014  . Degenerative disc disease, cervical 07/25/2014  . S/P CABG x 3 07/09/2014  . Hypothyroidism 07/09/2014  . Hyperlipidemia 07/09/2014  . GERD (gastroesophageal reflux disease) 07/09/2014  . Gout 07/09/2014    Celyn Nilda Simmer PT, MPH 01/01/2015, 10:56 AM  [facility] [place], [name], [address] Phone: [phone]   Fax:  [phone]

## 2015-01-02 ENCOUNTER — Ambulatory Visit (INDEPENDENT_AMBULATORY_CARE_PROVIDER_SITE_OTHER): Payer: 59 | Admitting: Physical Therapy

## 2015-01-02 DIAGNOSIS — M542 Cervicalgia: Secondary | ICD-10-CM | POA: Diagnosis not present

## 2015-01-02 DIAGNOSIS — M436 Torticollis: Secondary | ICD-10-CM | POA: Diagnosis not present

## 2015-01-02 DIAGNOSIS — M6248 Contracture of muscle, other site: Secondary | ICD-10-CM | POA: Diagnosis not present

## 2015-01-02 DIAGNOSIS — M62838 Other muscle spasm: Secondary | ICD-10-CM

## 2015-01-02 DIAGNOSIS — G44209 Tension-type headache, unspecified, not intractable: Secondary | ICD-10-CM | POA: Diagnosis not present

## 2015-01-02 NOTE — Therapy (Addendum)
[place], [name], [address] Phone: [phone]   Fax:  [phone]  Physical Therapy Treatment  Patient Details  Name: John Jefferson MRN: [id number] Date of Birth: Mar 20, 1956 Referring Provider:  Gregor Hams, MD  Encounter Date: 01/02/2015      PT End of Session - 01/02/15 0805    Visit Number 15   Number of Visits 18   Date for PT Re-Evaluation 01/13/15   PT Start Time 0801   PT Stop Time 8887   PT Time Calculation (min) 58 min   Activity Tolerance Patient tolerated treatment well      Past Medical History  Diagnosis Date  . S/P CABG x 3 07/09/2014  . Hypothyroidism 07/09/2014  . Gout 07/09/2014  . CAD (coronary artery disease)   . Hyperlipidemia   . GERD (gastroesophageal reflux disease)   . Depression   . Anxiety   . Fibromyalgia   . Cervical strain 11/21/2014  . Cervicogenic headache 11/21/2014    Past Surgical History  Procedure Laterality Date  . Cervical fusion    . Knee surgery    . Back surgery    . Coronary artery bypass graft  March 2015    Performed in [place]  . Rotator cuff surgery    . Cataract extraction Bilateral     There were no vitals filed for this visit.  Visit Diagnosis:  Stiffness of neck  Tension-type headache, not intractable, unspecified chronicity pattern  Pain, neck  Muscle spasms of neck      Subjective Assessment - 01/02/15 0805    Subjective Pt reports his pain has reduced from 7/10 to 4/10 and pain has now localized since beginning therapy.  Headaches are still constant.  Pt reports his cervical rotation has improved a great deal.    Currently in Pain? Yes   Pain Score 4    Pain Location Neck   Pain Orientation Upper            [facility] PT Assessment - 01/02/15 0001    Assessment   Medical Diagnosis cervical spasm   Onset Date/Surgical Date 10/08/14   Hand Dominance Right   Next MD Visit 01/29/15           [facility] Adult PT  Treatment/Exercise - 01/02/15 0001    Neck Exercises: Machines for Strengthening   UBE (Upper Arm Bike) L3: 2 min forward, 2 min backward; standing   Neck Exercises: Standing   Other Standing Exercises doorway stretch 3 positions 20-30 sec hold 1rep   stopped due to LBP   Neck Exercises: Seated   Shoulder Shrugs 10 reps   Other Seated Exercise scap retraction x 10 reps    Neck Exercises: Supine   Shoulder Flexion Right;Left;10 reps;Weights   Shoulder Flexion Weights (lbs) 2   Upper Extremity D2 Flexion;10 reps;Theraband   Theraband Level (UE D2) Level 3 (Green)   Other Supine Exercise Hooklying on full foam roll:  shoulder horizontal abd x 30 sec, snow angels x 10.  Protraction/ retractioin with 2 # weight   Other Supine Exercise Supine bilateral shoulder ER with red band x 10, with green band x 10.     Moist Heat Therapy   Number Minutes Moist Heat 15 Minutes   Moist Heat Location Cervical   Ultrasound   Ultrasound Location bilat cervical to occipital area    Ultrasound Parameters 100%; 1mz; 1.5 w/cm2 8 min   Ultrasound Goals Pain   Traction  Type of Traction Cervical   Min (lbs) 12   Max (lbs) 12   Hold Time Static   Time 15                  PT Short Term Goals - 12/23/14 1005    PT SHORT TERM GOAL #1   Title I with initial HEP ( 12/23/14)   Time 3   Period Weeks   Status Achieved   PT SHORT TERM GOAL #2   Title improve cervical ROM by 10 degrees in all motions ( 01/06/15)    Baseline some improvement noted - continued limitations   Time 5   Period Weeks   Status Revised   PT SHORT TERM GOAL #3   Title assess shoulder and upper back strength ( 01/06/15)    Time 5   Period Weeks   Status Revised   PT SHORT TERM GOAL #4   Title reports pain decrease =/> 25% overall ( 01/06/15)   Baseline at times - continues to experience variable pain intensity and frequency   Time 5   Period Weeks   Status Revised           PT Long Term Goals - 12/17/14 1303     PT LONG TERM GOAL #1   Title I with advanced HEP ( 01/13/15)    Time 6   Period Weeks   Status On-going   PT LONG TERM GOAL #2   Title demo cervical ROM WFL and pain =/< 2/10 ( 01/13/15)    Time 6   Period Weeks   Status On-going   PT LONG TERM GOAL #3   Title demo shoulder and upper back strength =/> 5-/5 ( 01/13/15)    Time 6   Period Weeks   Status On-going   PT LONG TERM GOAL #4   Title tolerate driving =/> 4 hrs before needing a [place] to allow return to work  (01/13/15)   Time 6   Period Weeks   Status On-going   PT LONG TERM GOAL #5   Title report ability to sleep per his prior level ( 01/13/15)    Time 6   Period Weeks   Status On-going   PT LONG TERM GOAL #6   Title improve FOTO =/< 41% limited ( 01/13/15)    Time 6   Period Weeks   Status On-going               Plan - 01/02/15 1049    Clinical Impression Statement Pt tolerated increased resistance with tband exercises without increase in pain or compensatory strategies.  Pt strained with standing doorway stretch, reported relief with hooklying foam roll pec stretch.  Pt reported decrease in pain to 3/10 at end of session, "feeling looser".     Pt will benefit from skilled therapeutic intervention in order to improve on the following deficits Pain;Postural dysfunction;Hypomobility;Increased muscle spasms;Decreased range of motion   Rehab Potential Good   PT Frequency 2x / week   PT Duration 6 weeks   PT Treatment/Interventions Ultrasound;Neuromuscular re-education;Passive range of motion;Patient/family education;Cryotherapy;Electrical Stimulation;Moist Heat;Therapeutic exercise;Manual techniques;Taping   PT Next Visit Plan [place] for cervical tightness. Continue with manual therapy, postural correction   Consulted and Agree with Plan of Care Patient        Problem List Patient Active Problem List   Diagnosis Date Noted  . Cervical strain 11/21/2014  . Cervicogenic headache 11/21/2014  . Post concussion  syndrome 11/07/2014  . Lumbar facet joint  pain 07/25/2014  . CAD (coronary artery disease) 07/25/2014  . Vitamin D deficiency 07/25/2014  . Degenerative disc disease, cervical 07/25/2014  . S/P CABG x 3 07/09/2014  . Hypothyroidism 07/09/2014  . Hyperlipidemia 07/09/2014  . GERD (gastroesophageal reflux disease) 07/09/2014  . Gout 07/09/2014    Kerin Perna, PTA 01/02/2015 10:54 AM   [place], [name], [address] Phone: [phone]   Fax:  [phone]  PHYSICAL THERAPY DISCHARGE SUMMARY  Visits from Start of Care: 15  Current functional level related to goals / functional outcomes: Some improvement in cervical pain and limitations. Progressing gradually toward stated goals of therapy.    Remaining deficits: Continued pain and limited ROM   Education / Equipment: HEP/theraband  Plan: Patient agrees to discharge.  Patient goals were partially met. Patient is being discharged due to the patient's request.  ?????   Celyn P. Helene Kelp PT, MPH 01/09/2015 2:28 PM

## 2015-01-06 ENCOUNTER — Encounter: Payer: 59 | Admitting: Rehabilitative and Restorative Service Providers"

## 2015-01-08 ENCOUNTER — Encounter: Payer: 59 | Admitting: Physical Therapy

## 2015-01-10 ENCOUNTER — Encounter: Payer: 59 | Admitting: Rehabilitative and Restorative Service Providers"

## 2015-01-13 ENCOUNTER — Encounter: Payer: 59 | Admitting: Rehabilitative and Restorative Service Providers"

## 2015-01-15 ENCOUNTER — Telehealth: Payer: Self-pay | Admitting: Neurology

## 2015-01-15 ENCOUNTER — Encounter: Payer: 59 | Admitting: Physical Therapy

## 2015-01-15 NOTE — Telephone Encounter (Signed)
Pt called requesting to r/s appt  11/14. Dr Jannifer Franklin is booked out until March 2017.He states he can not wait until March. Please call and advise with an appt date at [phone].

## 2015-01-16 NOTE — Telephone Encounter (Signed)
I called the patient. Appointment scheduled 11/7.

## 2015-01-17 ENCOUNTER — Encounter: Payer: 59 | Admitting: Physical Therapy

## 2015-01-26 ENCOUNTER — Other Ambulatory Visit: Payer: Self-pay | Admitting: Family Medicine

## 2015-01-29 ENCOUNTER — Other Ambulatory Visit: Payer: Self-pay | Admitting: Family Medicine

## 2015-01-29 ENCOUNTER — Encounter: Payer: Self-pay | Admitting: Family Medicine

## 2015-01-29 ENCOUNTER — Ambulatory Visit (INDEPENDENT_AMBULATORY_CARE_PROVIDER_SITE_OTHER): Payer: 59 | Admitting: Family Medicine

## 2015-01-29 VITALS — BP 130/80 | HR 71 | Wt 214.0 lb

## 2015-01-29 DIAGNOSIS — G4486 Cervicogenic headache: Secondary | ICD-10-CM

## 2015-01-29 DIAGNOSIS — R51 Headache: Secondary | ICD-10-CM | POA: Diagnosis not present

## 2015-01-29 DIAGNOSIS — S161XXD Strain of muscle, fascia and tendon at neck level, subsequent encounter: Secondary | ICD-10-CM

## 2015-01-29 DIAGNOSIS — F0781 Postconcussional syndrome: Secondary | ICD-10-CM

## 2015-01-29 NOTE — Patient Instructions (Signed)
Thank you for coming in today. Return to work part time on the 24th of October.  Return to me in 1 month.  Follow up with Dr. Jannifer Franklin.  Go to the emergency room if your headache becomes excruciating or you have weakness or numbness or uncontrolled vomiting.   Continue PT.

## 2015-01-29 NOTE — Progress Notes (Signed)
John Jefferson is a 59 y.o. male who presents to [facility]  today for follow-up postconcussion syndrome. Patient suffered a concussion in August this year. Since then he's been unable to work due to confusion headache and neck pain. He's been to physical therapy which is quite helpful for his neck pain. Additionally he takes gabapentin Cymbalta and amantadine for his concussion symptoms. He states his ears are quite helpful. Overall he states that he's feeling a lot better but not 100% better. He is able to do several hours of computer work at home without significant symptoms however he tended to work in the computer the entire day and had a bad headache. He would like to return to work part-time if able. Additionally he notes he has an appointment with a neurologist in November.   Past Medical History  Diagnosis Date  . S/P CABG x 3 07/09/2014  . Hypothyroidism 07/09/2014  . Gout 07/09/2014  . CAD (coronary artery disease)   . Hyperlipidemia   . GERD (gastroesophageal reflux disease)   . Depression   . Anxiety   . Fibromyalgia   . Cervical strain 11/21/2014  . Cervicogenic headache 11/21/2014   Past Surgical History  Procedure Laterality Date  . Cervical fusion    . Knee surgery    . Back surgery    . Coronary artery bypass graft  March 2015    Performed in [place]  . Rotator cuff surgery    . Cataract extraction Bilateral    Social History  Substance Use Topics  . Smoking status: Former Smoker    Quit date: 04/12/1985  . Smokeless tobacco: Never Used  . Alcohol Use: No   family history includes Cancer in his sister; Heart attack in his father; Stroke in his mother; Thyroid disease in his brother, mother, sister, and sister.  ROS as above Medications: Current Outpatient Prescriptions  Medication Sig Dispense Refill  . allopurinol (ZYLOPRIM) 300 MG tablet Take 1 tablet (300 mg total) by mouth daily. Due for PCP follow up soon. 90 tablet 0   . aspirin 81 MG tablet Take 1 tablet (81 mg total) by mouth daily. Due for PCP follow up soon. 90 tablet 3  . atorvastatin (LIPITOR) 80 MG tablet Take 1 tablet by mouth  daily 90 tablet 1  . clopidogrel (PLAVIX) 75 MG tablet Take 1 tablet (75 mg total) by mouth daily. Due for PCP follow up soon. 90 tablet 0  . DULoxetine (CYMBALTA) 60 MG capsule Take 1 capsule by mouth  daily 90 capsule 0  . gabapentin (NEURONTIN) 300 MG capsule Take 2 capsules (600 mg total) by mouth 3 (three) times daily. 540 capsule 0  . levothyroxine (SYNTHROID, LEVOTHROID) 75 MCG tablet Take 1 tablet by mouth  daily before breakfast 90 tablet 1  . lidocaine (LIDODERM) 5 % Place 1 patch onto the skin daily. Remove & Discard patch within 12 hours or as directed by MD    . pantoprazole (PROTONIX) 40 MG tablet Take 1 tablet by mouth  daily 90 tablet 1  . amantadine (SYMMETREL) 100 MG capsule TAKE 1 CAPSULE(100 MG) BY MOUTH TWICE DAILY 60 capsule 0   No current facility-administered medications for this visit.   No Known Allergies   Exam:  BP 130/80 mmHg  Pulse 71  Wt 214 lb (97.07 kg) Gen: Well NAD HEENT: EOMI,  MMM Lungs: Normal work of breathing. CTABL Heart: RRR no MRG Abd: NABS, Soft. Nondistended, Nontender Exts: Brisk capillary refill, warm  and well perfused.  Neuro: Alert and oriented normal coordination balance and gait. Strength is intact bilaterally. Patient has intact. Recall immediate and delayed. He has intact months of the reversed and intact serial sevens.  No results found for this or any previous visit (from the past 24 hour(s)). No results found.   Please see individual assessment and plan sections.  I spent at least 25 minutes with the patient with a 50% face-to-face time.

## 2015-01-29 NOTE — Assessment & Plan Note (Signed)
Doing well but not fully better. Return to work part-time starting Monday the 24th. Return to clinic next month. Follow-up with neurology. Appreciate neurology recommendations.

## 2015-01-29 NOTE — Assessment & Plan Note (Signed)
Continue physical therapy

## 2015-02-16 ENCOUNTER — Other Ambulatory Visit: Payer: Self-pay | Admitting: Family Medicine

## 2015-02-17 ENCOUNTER — Ambulatory Visit (INDEPENDENT_AMBULATORY_CARE_PROVIDER_SITE_OTHER): Payer: 59 | Admitting: Neurology

## 2015-02-17 ENCOUNTER — Encounter: Payer: Self-pay | Admitting: Neurology

## 2015-02-17 VITALS — BP 111/72 | HR 67 | Ht 70.0 in | Wt 214.5 lb

## 2015-02-17 DIAGNOSIS — R51 Headache: Secondary | ICD-10-CM | POA: Diagnosis not present

## 2015-02-17 DIAGNOSIS — G4486 Cervicogenic headache: Secondary | ICD-10-CM

## 2015-02-17 DIAGNOSIS — R413 Other amnesia: Secondary | ICD-10-CM

## 2015-02-17 HISTORY — DX: Other amnesia: R41.3

## 2015-02-17 NOTE — Patient Instructions (Signed)
We will get blood work today and get neuropsychological testing set up.   Cervical Sprain A cervical sprain is an injury in the neck in which the strong, fibrous tissues (ligaments) that connect your neck bones stretch or tear. Cervical sprains can range from mild to severe. Severe cervical sprains can cause the neck vertebrae to be unstable. This can lead to damage of the spinal cord and can result in serious nervous system problems. The amount of time it takes for a cervical sprain to get better depends on the cause and extent of the injury. Most cervical sprains heal in 1 to 3 weeks. CAUSES  Severe cervical sprains may be caused by:   Contact sport injuries (such as from football, rugby, wrestling, hockey, auto racing, gymnastics, diving, martial arts, or boxing).   Motor vehicle collisions.   Whiplash injuries. This is an injury from a sudden forward and backward whipping movement of the head and neck.  Falls.  Mild cervical sprains may be caused by:   Being in an awkward position, such as while cradling a telephone between your ear and shoulder.   Sitting in a chair that does not offer proper support.   Working at a poorly Landscape architect station.   Looking up or down for long periods of time.  SYMPTOMS   Pain, soreness, stiffness, or a burning sensation in the front, back, or sides of the neck. This discomfort may develop immediately after the injury or slowly, 24 hours or more after the injury.   Pain or tenderness directly in the middle of the back of the neck.   Shoulder or upper back pain.   Limited ability to move the neck.   Headache.   Dizziness.   Weakness, numbness, or tingling in the hands or arms.   Muscle spasms.   Difficulty swallowing or chewing.   Tenderness and swelling of the neck.  DIAGNOSIS  Most of the time your health care provider can diagnose a cervical sprain by taking your history and doing a physical exam. Your  health care provider will ask about previous neck injuries and any known neck problems, such as arthritis in the neck. X-rays may be taken to find out if there are any other problems, such as with the bones of the neck. Other tests, such as a CT scan or MRI, may also be needed.  TREATMENT  Treatment depends on the severity of the cervical sprain. Mild sprains can be treated with rest, keeping the neck in place (immobilization), and pain medicines. Severe cervical sprains are immediately immobilized. Further treatment is done to help with pain, muscle spasms, and other symptoms and may include:  Medicines, such as pain relievers, numbing medicines, or muscle relaxants.   Physical therapy. This may involve stretching exercises, strengthening exercises, and posture training. Exercises and improved posture can help stabilize the neck, strengthen muscles, and help stop symptoms from returning.  HOME CARE INSTRUCTIONS   Put ice on the injured area.   Put ice in a plastic bag.   Place a towel between your skin and the bag.   Leave the ice on for 15-20 minutes, 3-4 times a day.   If your injury was severe, you may have been given a cervical collar to wear. A cervical collar is a two-piece collar designed to keep your neck from moving while it heals.  Do not remove the collar unless instructed by your health care provider.  If you have long hair, keep it outside of  the collar.  Ask your health care provider before making any adjustments to your collar. Minor adjustments may be required over time to improve comfort and reduce pressure on your chin or on the back of your head.  Ifyou are allowed to remove the collar for cleaning or bathing, follow your health care provider's instructions on how to do so safely.  Keep your collar clean by wiping it with mild soap and water and drying it completely. If the collar you have been given includes removable pads, remove them every 1-2 days and hand  wash them with soap and water. Allow them to air dry. They should be completely dry before you wear them in the collar.  If you are allowed to remove the collar for cleaning and bathing, wash and dry the skin of your neck. Check your skin for irritation or sores. If you see any, tell your health care provider.  Do not drive while wearing the collar.   Only take over-the-counter or prescription medicines for pain, discomfort, or fever as directed by your health care provider.   Keep all follow-up appointments as directed by your health care provider.   Keep all physical therapy appointments as directed by your health care provider.   Make any needed adjustments to your workstation to promote good posture.   Avoid positions and activities that make your symptoms worse.   Warm up and stretch before being active to help prevent problems.  SEEK MEDICAL CARE IF:   Your pain is not controlled with medicine.   You are unable to decrease your pain medicine over time as planned.   Your activity level is not improving as expected.  SEEK IMMEDIATE MEDICAL CARE IF:   You develop any bleeding.  You develop stomach upset.  You have signs of an allergic reaction to your medicine.   Your symptoms get worse.   You develop new, unexplained symptoms.   You have numbness, tingling, weakness, or paralysis in any part of your body.  MAKE SURE YOU:   Understand these instructions.  Will watch your condition.  Will get help right away if you are not doing well or get worse.   This information is not intended to replace advice given to you by your health care provider. Make sure you discuss any questions you have with your health care provider.   Document Released: 01/24/2007 Document Revised: 04/03/2013 Document Reviewed: 10/04/2012 Elsevier Interactive Patient Education [employer].
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
## 2015-02-18 LAB — RPR: RPR: NONREACTIVE

## 2015-02-19 LAB — METHYLMALONIC ACID, SERUM: METHYLMALONIC ACID: 237 nmol/L (ref 0–378)

## 2015-02-19 LAB — VITAMIN B12: VITAMIN B 12: 488 pg/mL (ref 211–946)

## 2015-02-19 LAB — SEDIMENTATION RATE: SED RATE: 2 mm/h (ref 0–30)

## 2015-02-19 LAB — HIV ANTIBODY (ROUTINE TESTING W REFLEX): HIV SCREEN 4TH GENERATION: NONREACTIVE

## 2015-02-20 ENCOUNTER — Telehealth: Payer: Self-pay | Admitting: Family Medicine

## 2015-02-20 MED ORDER — AMANTADINE HCL 100 MG PO CAPS: 100.0000 mg | ORAL_CAPSULE | Freq: Two times a day (BID) | ORAL | Status: DC

## 2015-02-20 NOTE — Telephone Encounter (Signed)
Refill request

## 2015-02-24 ENCOUNTER — Ambulatory Visit: Payer: 59 | Admitting: Neurology

## 2015-02-27 ENCOUNTER — Other Ambulatory Visit: Payer: Self-pay | Admitting: Family Medicine

## 2015-03-11 ENCOUNTER — Encounter: Payer: Self-pay | Admitting: Family Medicine

## 2015-03-11 ENCOUNTER — Ambulatory Visit (INDEPENDENT_AMBULATORY_CARE_PROVIDER_SITE_OTHER): Payer: 59 | Admitting: Family Medicine

## 2015-03-11 VITALS — BP 143/83 | HR 67 | Wt 212.0 lb

## 2015-03-11 DIAGNOSIS — M5030 Other cervical disc degeneration, unspecified cervical region: Secondary | ICD-10-CM | POA: Diagnosis not present

## 2015-03-11 DIAGNOSIS — G4486 Cervicogenic headache: Secondary | ICD-10-CM

## 2015-03-11 DIAGNOSIS — F0781 Postconcussional syndrome: Secondary | ICD-10-CM

## 2015-03-11 DIAGNOSIS — R51 Headache: Secondary | ICD-10-CM | POA: Diagnosis not present

## 2015-03-11 DIAGNOSIS — R413 Other amnesia: Secondary | ICD-10-CM

## 2015-03-11 MED ORDER — HYDROCODONE-ACETAMINOPHEN 5-325 MG PO TABS: 1.0000 | ORAL_TABLET | Freq: Four times a day (QID) | ORAL | Status: DC | PRN

## 2015-03-11 MED ORDER — PREDNISONE 5 MG (48) PO TBPK: ORAL_TABLET | ORAL | Status: DC

## 2015-03-11 NOTE — Assessment & Plan Note (Signed)
As above with postconcussion syndrome. Awaiting neuropsychological testing.

## 2015-03-11 NOTE — Patient Instructions (Signed)
Thank you for coming in today. Try the prednisone and pain medicine.  Return in 1 month or sooner if needed.  Continue PT.  Come back or go to the emergency room if you notice new weakness new numbness problems walking or bowel or bladder problems.

## 2015-03-11 NOTE — Assessment & Plan Note (Addendum)
Chronic, persistent. Patient continues to be severely debilitated by concentration and memory issues interfering with his ability to work as well as perform advanced activities of daily living such as driving. Discussed with patient medical treatment such as stimulants and Alzheimer medications, though none are approved by the FDA and patient to undergo neuropsychological evaluation in February. Patient concurrently followed by [facility], will defer further treatment until full assessment with neuropsychological testing. Recommend stopping work duties as this has prolonged his memory and concentration issues.

## 2015-03-11 NOTE — Assessment & Plan Note (Addendum)
Chronic, worsening on gabapentin and cymbalta. Patient has a history of fusion from C7-C4. I suspect the majority of his pain is probably coming from the C3-C4 level. This is most likely exacerbated by work given temporal relation to the onset of worsening pain. This pain has become debilitating, severely impacting his ability to work. Radicular symptoms present which is likely due to disc compressive neuropathy given past MRI.  Recommended continue physical therapy, Norco for pain control and prednisone taper. Discussed options of facet injection and nerve block, both not favored given the location of pathology. Will get an MRI in 1 month given with persistence of pain or worsening symptoms.
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
## 2015-03-11 NOTE — Assessment & Plan Note (Signed)
Patient endorses no headache.

## 2015-03-18 ENCOUNTER — Other Ambulatory Visit: Payer: Self-pay | Admitting: Family Medicine

## 2015-03-19 NOTE — Telephone Encounter (Signed)
All Rxs approved

## 2015-04-09 ENCOUNTER — Encounter: Payer: Self-pay | Admitting: Family Medicine

## 2015-04-09 ENCOUNTER — Ambulatory Visit (INDEPENDENT_AMBULATORY_CARE_PROVIDER_SITE_OTHER): Payer: 59 | Admitting: Family Medicine

## 2015-04-09 VITALS — BP 118/74 | HR 79 | Wt 212.0 lb

## 2015-04-09 DIAGNOSIS — F0781 Postconcussional syndrome: Secondary | ICD-10-CM

## 2015-04-09 DIAGNOSIS — M5030 Other cervical disc degeneration, unspecified cervical region: Secondary | ICD-10-CM | POA: Diagnosis not present

## 2015-04-09 MED ORDER — PREDNISONE 5 MG (48) PO TBPK: ORAL_TABLET | ORAL | Status: DC

## 2015-04-09 MED ORDER — HYDROCODONE-ACETAMINOPHEN 5-325 MG PO TABS: 1.0000 | ORAL_TABLET | Freq: Four times a day (QID) | ORAL | Status: DC | PRN

## 2015-04-09 NOTE — Assessment & Plan Note (Signed)
Somewhat improved with pain control. Scheduled for neuropsychologic testing in February. Will follow up thereafter or sooner with worsening issues.

## 2015-04-09 NOTE — Assessment & Plan Note (Addendum)
Moderate improvement. Discussed several options including facet injections with patient who has opted to see a neurosurgeon for second opinion on interventions for neck pain. Will see Dr. Earley Favor thereafter if needed for potential facet injections. Prescribed prednisone dose pack to use with severe worsening in pain.

## 2015-04-09 NOTE — Patient Instructions (Signed)
Thank you for coming in today. You were seen for your neck pain and post concussion syndrome We think you have improved since last visit. We will refer you to a neurosurgeon for second opinion on options for your neck pain. After that, you can return to Dr. Emmaline Kluver for consideration of a facet injection in your neck.  We will prescribe a steroid dose pack to use if you get much worse.  Please return to [place] after your testing in February or sooner if needed.
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
## 2015-05-28 ENCOUNTER — Ambulatory Visit: Payer: 59 | Attending: Neurology | Admitting: Psychology

## 2015-05-28 DIAGNOSIS — G3184 Mild cognitive impairment, so stated: Secondary | ICD-10-CM

## 2015-05-28 DIAGNOSIS — F411 Generalized anxiety disorder: Secondary | ICD-10-CM | POA: Insufficient documentation

## 2015-05-28 DIAGNOSIS — F4542 Pain disorder with related psychological factors: Secondary | ICD-10-CM | POA: Insufficient documentation

## 2015-05-28 DIAGNOSIS — F0781 Postconcussional syndrome: Secondary | ICD-10-CM | POA: Insufficient documentation

## 2015-05-29 ENCOUNTER — Encounter: Payer: Self-pay | Admitting: Psychology
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
## 2015-06-03 ENCOUNTER — Other Ambulatory Visit: Payer: Self-pay | Admitting: Family Medicine

## 2015-06-04 ENCOUNTER — Ambulatory Visit: Payer: 59 | Admitting: Adult Health

## 2015-06-06 ENCOUNTER — Encounter: Payer: Self-pay | Admitting: Psychology

## 2015-06-06 ENCOUNTER — Ambulatory Visit (INDEPENDENT_AMBULATORY_CARE_PROVIDER_SITE_OTHER): Payer: 59 | Admitting: Psychology

## 2015-06-06 DIAGNOSIS — F411 Generalized anxiety disorder: Secondary | ICD-10-CM | POA: Diagnosis not present

## 2015-06-06 DIAGNOSIS — F0781 Postconcussional syndrome: Secondary | ICD-10-CM

## 2015-06-06 DIAGNOSIS — F4542 Pain disorder with related psychological factors: Secondary | ICD-10-CM
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
## 2015-06-09 ENCOUNTER — Ambulatory Visit (INDEPENDENT_AMBULATORY_CARE_PROVIDER_SITE_OTHER): Payer: 59 | Admitting: Adult Health

## 2015-06-09 ENCOUNTER — Encounter: Payer: Self-pay | Admitting: Adult Health

## 2015-06-09 VITALS — BP 134/84 | HR 61 | Ht 70.0 in | Wt 212.0 lb

## 2015-06-09 DIAGNOSIS — R51 Headache: Secondary | ICD-10-CM | POA: Diagnosis not present

## 2015-06-09 DIAGNOSIS — G8929 Other chronic pain: Secondary | ICD-10-CM

## 2015-06-09 DIAGNOSIS — M542 Cervicalgia: Secondary | ICD-10-CM

## 2015-06-09 DIAGNOSIS — R413 Other amnesia: Secondary | ICD-10-CM | POA: Diagnosis not present

## 2015-06-09 DIAGNOSIS — F0781 Postconcussional syndrome: Secondary | ICD-10-CM

## 2015-06-09 DIAGNOSIS — G4486 Cervicogenic headache: Secondary | ICD-10-CM

## 2015-06-09 NOTE — Progress Notes (Signed)
I have read the note, and I agree with the clinical assessment and plan.  Orly Quimby KEITH   
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
## 2015-06-09 NOTE — Patient Instructions (Signed)
Continue on gabapentin Referral to psychology and pain management  If your symptoms worsen or you develop new symptoms please let us know.

## 2015-06-13 ENCOUNTER — Telehealth: Payer: Self-pay | Admitting: Neurology
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
## 2015-06-17 ENCOUNTER — Telehealth: Payer: Self-pay | Admitting: *Deleted

## 2015-06-17 NOTE — Telephone Encounter (Signed)
Will fax this note to the number provided. 

## 2015-06-17 NOTE — Telephone Encounter (Signed)
Ok to hold plavix for procedure Brian Crenshaw  

## 2015-06-17 NOTE — Telephone Encounter (Signed)
Pt is needing clearance for bilateral occipital nerve block and cervical injection. Pt also needs clearance to hold plavix prior to the procedure. Will forward for dr Stanford Breed review

## 2015-06-18 ENCOUNTER — Other Ambulatory Visit: Payer: Self-pay | Admitting: Family Medicine

## 2015-06-18 ENCOUNTER — Telehealth: Payer: Self-pay | Admitting: Adult Health

## 2015-06-18 DIAGNOSIS — F0781 Postconcussional syndrome: Secondary | ICD-10-CM

## 2015-06-18 DIAGNOSIS — R413 Other amnesia: Secondary | ICD-10-CM

## 2015-06-18 NOTE — Telephone Encounter (Signed)
Patient is calling. He got a referrel to Dr. Gertie Gowda but would like that changed. He would now like to be referred to Dr. Norval Gable in [facility]. Thank you.

## 2015-06-18 NOTE — Telephone Encounter (Signed)
Is it ok to change this referral?

## 2015-06-18 NOTE — Telephone Encounter (Signed)
Referral changed. Staff message sent to [facility] [facility].

## 2015-06-18 NOTE — Telephone Encounter (Signed)
Yes

## 2015-06-23 ENCOUNTER — Ambulatory Visit: Payer: 59 | Admitting: Neurology

## 2015-06-24 ENCOUNTER — Telehealth: Payer: Self-pay | Admitting: Neurology

## 2015-06-24 NOTE — Telephone Encounter (Signed)
John Jefferson with [facility] [phone] x [phone] called said referral needs to be faxed to [phone]. She said they are not set up on EPIC sharing yet and cannot see any notes on the pt. Thank you

## 2015-06-25 NOTE — Telephone Encounter (Signed)
Noted referral was faxed to Dr. Thayer Ohm telephone [phone] fax [phone].

## 2015-06-26 ENCOUNTER — Ambulatory Visit (INDEPENDENT_AMBULATORY_CARE_PROVIDER_SITE_OTHER): Payer: 59 | Admitting: Family Medicine

## 2015-06-26 VITALS — BP 126/73 | HR 71 | Temp 98.1°F | Wt 205.0 lb

## 2015-06-26 DIAGNOSIS — M5030 Other cervical disc degeneration, unspecified cervical region: Secondary | ICD-10-CM | POA: Diagnosis not present

## 2015-06-26 MED ORDER — ZOSTER VACCINE LIVE 19400 UNT/0.65ML ~~LOC~~ SOLR: 0.6500 mL | Freq: Once | SUBCUTANEOUS | Status: DC

## 2015-06-26 NOTE — Patient Instructions (Signed)
Thank you for coming in today. Return in 2 months.  Schedule a wellness exam 30 minutes.

## 2015-06-26 NOTE — Assessment & Plan Note (Addendum)
Managed per pain management.  Stop topiramate. Recheck in a few months. Plan also for CBT
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
## 2015-07-14 ENCOUNTER — Telehealth: Payer: Self-pay | Admitting: Psychology

## 2015-07-14 ENCOUNTER — Other Ambulatory Visit: Payer: Self-pay | Admitting: Family Medicine

## 2015-07-14 MED ORDER — PANTOPRAZOLE SODIUM 40 MG PO TBEC: 40.0000 mg | DELAYED_RELEASE_TABLET | Freq: Every day | ORAL | Status: DC

## 2015-07-14 NOTE — Telephone Encounter (Signed)
Spoke to patient's wife the office is having some problems with there fax referral was sent  [phone].

## 2015-07-14 NOTE — Telephone Encounter (Signed)
Received a call from his wife, Ms. Matt Engen, today. She stated that both pain management psychologists whom I had recommended to Dr. Jannifer Franklin to possibly treat her husband are unable to see him (one not on his insurance panel, one not accepting new patients). I then contacted one of them, Dr. Norval Gable, who recomended three other colleagues: 1) Almyra Brace Ph.D with [facility] [phone]), 2) Camillo Flaming Ph.D [phone]) with [facility] and 3) Leafy Half Ph.D [phone]). I called Mr. Arminio' wife back and gave her these names. She will collaborate with [facility] staff to facilitate a psychology referral.

## 2015-07-14 NOTE — Telephone Encounter (Signed)
Pts wife called and gave a fax number of [phone] for referral to be sent to [facility]. Hinton Dyer was able to speak with the wife over the phone.

## 2015-07-24 ENCOUNTER — Other Ambulatory Visit: Payer: Self-pay | Admitting: Family Medicine

## 2015-07-27 ENCOUNTER — Other Ambulatory Visit: Payer: Self-pay | Admitting: Family Medicine

## 2015-07-29 ENCOUNTER — Telehealth: Payer: Self-pay

## 2015-07-29 DIAGNOSIS — M5030 Other cervical disc degeneration, unspecified cervical region: Secondary | ICD-10-CM

## 2015-07-29 MED ORDER — HYDROCODONE-ACETAMINOPHEN 5-325 MG PO TABS: 1.0000 | ORAL_TABLET | Freq: Four times a day (QID) | ORAL | Status: DC | PRN

## 2015-07-29 NOTE — Telephone Encounter (Signed)
Pt called requesting a refill for HYDROcodone-acetaminophen (NORCO/VICODIN) 5-325 MG tablet. He states that he has an appointment to see pain management next month. Please advise if refill is appropriate.

## 2015-07-29 NOTE — Telephone Encounter (Signed)
Norco refilled

## 2015-07-30 NOTE — Telephone Encounter (Signed)
Pt notified. rx placed up front for pickup.

## 2015-08-26 ENCOUNTER — Encounter: Payer: Self-pay | Admitting: Family Medicine

## 2015-08-26 ENCOUNTER — Ambulatory Visit (INDEPENDENT_AMBULATORY_CARE_PROVIDER_SITE_OTHER): Payer: 59 | Admitting: Family Medicine

## 2015-08-26 VITALS — BP 114/77 | HR 66 | Wt 195.0 lb

## 2015-08-26 DIAGNOSIS — M109 Gout, unspecified: Secondary | ICD-10-CM | POA: Diagnosis not present

## 2015-08-26 DIAGNOSIS — Z125 Encounter for screening for malignant neoplasm of prostate: Secondary | ICD-10-CM

## 2015-08-26 DIAGNOSIS — Z23 Encounter for immunization: Secondary | ICD-10-CM

## 2015-08-26 DIAGNOSIS — K2270 Barrett's esophagus without dysplasia: Secondary | ICD-10-CM | POA: Diagnosis not present

## 2015-08-26 DIAGNOSIS — I25810 Atherosclerosis of coronary artery bypass graft(s) without angina pectoris: Secondary | ICD-10-CM

## 2015-08-26 DIAGNOSIS — E785 Hyperlipidemia, unspecified: Secondary | ICD-10-CM | POA: Diagnosis not present

## 2015-08-26 DIAGNOSIS — Z0000 Encounter for general adult medical examination without abnormal findings: Secondary | ICD-10-CM | POA: Diagnosis not present

## 2015-08-26 DIAGNOSIS — IMO0001 Reserved for inherently not codable concepts without codable children: Secondary | ICD-10-CM

## 2015-08-26 DIAGNOSIS — E559 Vitamin D deficiency, unspecified: Secondary | ICD-10-CM

## 2015-08-26 DIAGNOSIS — Z1211 Encounter for screening for malignant neoplasm of colon: Secondary | ICD-10-CM

## 2015-08-26 DIAGNOSIS — Z1159 Encounter for screening for other viral diseases: Secondary | ICD-10-CM

## 2015-08-26 DIAGNOSIS — E039 Hypothyroidism, unspecified: Secondary | ICD-10-CM

## 2015-08-26 LAB — COMPREHENSIVE METABOLIC PANEL
ALBUMIN: 4.7 g/dL (ref 3.6–5.1)
ALT: 26 U/L (ref 9–46)
AST: 19 U/L (ref 10–35)
Alkaline Phosphatase: 74 U/L (ref 40–115)
BILIRUBIN TOTAL: 0.8 mg/dL (ref 0.2–1.2)
BUN: 21 mg/dL (ref 7–25)
CO2: 21 mmol/L (ref 20–31)
CREATININE: 1.18 mg/dL (ref 0.70–1.25)
Calcium: 9.8 mg/dL (ref 8.6–10.3)
Chloride: 109 mmol/L (ref 98–110)
GLUCOSE: 89 mg/dL (ref 65–99)
Potassium: 4.3 mmol/L (ref 3.5–5.3)
SODIUM: 141 mmol/L (ref 135–146)
Total Protein: 6.8 g/dL (ref 6.1–8.1)

## 2015-08-26 LAB — CBC
HCT: 46.3 % (ref 38.5–50.0)
HEMOGLOBIN: 15.2 g/dL (ref 13.2–17.1)
MCH: 29.6 pg (ref 27.0–33.0)
MCHC: 32.8 g/dL (ref 32.0–36.0)
MCV: 90.3 fL (ref 80.0–100.0)
MPV: 10.2 fL (ref 7.5–12.5)
PLATELETS: 248 10*3/uL (ref 140–400)
RBC: 5.13 MIL/uL (ref 4.20–5.80)
RDW: 15.1 % — ABNORMAL HIGH (ref 11.0–15.0)
WBC: 12.6 10*3/uL — AB (ref 3.8–10.8)

## 2015-08-26 LAB — URIC ACID: URIC ACID, SERUM: 5.4 mg/dL (ref 4.0–7.8)

## 2015-08-26 LAB — LIPID PANEL
Cholesterol: 137 mg/dL (ref 125–200)
HDL: 59 mg/dL (ref >=40)
LDL CALC: 53 mg/dL (ref <130)
TRIGLYCERIDES: 126 mg/dL (ref <150)
Total CHOL/HDL Ratio: 2.3 Ratio (ref <=5.0)
VLDL: 25 mg/dL (ref <30)

## 2015-08-26 LAB — HEMOGLOBIN A1C
HEMOGLOBIN A1C: 5.8 % — AB (ref <5.7)
MEAN PLASMA GLUCOSE: 120 mg/dL

## 2015-08-26 NOTE — Progress Notes (Signed)
John Jefferson is a 60 y.o. male who presents to [facility] today for well visit.  Patient presents to clinic today for wellness visit. He's been seen multiple times now for the last year for management of his memory loss confusion and chronic neck and back pain. Please see prior notes for further details. Overall he is doing reasonably well. He attends pain management starting last week for his chronic neck and back pain. He is also attending counseling/talk therapy for management of suffering due to chronic pain. He notes he is due for colonoscopy as well as upper endoscopy for screening for colon cancer and for management of pair's esophagus. As previously was managed with gastroenterology in [place]. Additionally he notes he is due for PSA testing for which she would like to continue screening for prostate cancer. Additionally he notes he is due for shingles vaccination.  PHQ2 is negative   Past Medical History  Diagnosis Date  . S/P CABG x 3 07/09/2014  . Hypothyroidism 07/09/2014  . Gout 07/09/2014  . CAD (coronary artery disease)   . Hyperlipidemia   . GERD (gastroesophageal reflux disease)   . Depression   . Anxiety   . Fibromyalgia   . Cervical strain 11/21/2014  . Cervicogenic headache 11/21/2014  . Memory difficulty 02/17/2015   Past Surgical History  Procedure Laterality Date  . Cervical fusion    . Knee surgery    . Back surgery    . Coronary artery bypass graft  March 2015    Performed in [place]  . Rotator cuff surgery    . Cataract extraction Bilateral    Social History  Substance Use Topics  . Smoking status: Former Smoker    Quit date: 04/12/1985  . Smokeless tobacco: Never Used  . Alcohol Use: No   family history includes Cancer in his sister; Heart attack in his father; Stroke in his mother; Thyroid disease in his brother, mother, sister, and  sister.  ROS as above Medications: Current Outpatient Prescriptions  Medication Sig Dispense Refill  . allopurinol (ZYLOPRIM) 300 MG tablet Take 1 tablet by mouth  daily 90 tablet 1  . aspirin 81 MG tablet Take 1 tablet (81 mg total) by mouth daily. Due for PCP follow up soon. 90 tablet 3  . atorvastatin (LIPITOR) 80 MG tablet Take 1 tablet (80 mg total) by mouth daily. NEED FOLLOW UP APPOINTMENT FOR MORE REFILLS 30 tablet 0  . clopidogrel (PLAVIX) 75 MG tablet Take 1 tablet by mouth  daily 90 tablet 1  . DULoxetine (CYMBALTA) 60 MG capsule Take 1 capsule by mouth  daily 90 capsule 1  . gabapentin (NEURONTIN) 300 MG capsule Take 2 capsules by mouth 3  times daily 540 capsule 1  . HYDROcodone-acetaminophen (NORCO/VICODIN) 5-325 MG tablet Take 1 tablet by mouth every 6 (six) hours as needed. 30 tablet 0  . levothyroxine (SYNTHROID, LEVOTHROID) 75 MCG tablet Take 1 tablet by mouth  daily before breakfast 90 tablet 1  . lidocaine (LIDODERM) 5 % Place 1 patch onto the skin daily. Remove & Discard patch within 12 hours or as directed by MD    . pantoprazole (PROTONIX) 40 MG tablet Take 1 tablet (40 mg total) by mouth daily. 30 tablet 3  . topiramate (TOPAMAX) 50 MG tablet      No current facility-administered medications for this visit.   No Known Allergies   Exam:  BP 114/77 mmHg  Pulse 66  Wt  195 lb (88.451 kg) Gen: Well NAD HEENT: EOMI,  MMM Lungs: Normal work of breathing. CTABL Heart: RRR no MRG Abd: NABS, Soft. Nondistended, Nontender Exts: Brisk capillary refill, warm and well perfused.   No results found for this or any previous visit (from the past 24 hour(s)). No results found.   60 year old male here for wellness visit  Doing reasonably well but his life is certainly drastically affected by the degree of his chronic pain. This seems to be pretty well managed at the time being. Plan to obtain basic fasting labs to check on cholesterol metabolic panel vitamin D uric acid as  well as prostate cancer screening.  Will refer to GI for upper endoscopy to manage varus esophagus, and colon cancer screening. We'll give shingles vaccine prior to discharge. Patient is up-to-date with tetanus vaccine. Additionally we'll check hepatitis C per CDC guidelines. Follow-up in 6 months or so. Return sooner if needed.

## 2015-08-26 NOTE — Patient Instructions (Signed)
Thank you for coming in today. Get labs today.  You should hear from [facility] about the upper scope and colonoscopy soon.  If you do not hear from them let me know.  Return in 6 months or sooner if needed.

## 2015-08-27 LAB — VITAMIN D 25 HYDROXY (VIT D DEFICIENCY, FRACTURES): Vit D, 25-Hydroxy: 22 ng/mL — ABNORMAL LOW (ref 30–100)

## 2015-08-27 LAB — HEPATITIS C ANTIBODY: HCV Ab: NEGATIVE

## 2015-08-27 LAB — PSA: PSA: 0.3 ng/mL (ref <=4.00)

## 2015-08-27 LAB — TSH: TSH: 1.88 m[IU]/L (ref 0.40–4.50)

## 2015-08-27 NOTE — Progress Notes (Signed)
Quick Note:  1) Vit D is a bit low. Take 5000 units daily.  2) Pre-diabetes is present. Reduce carbohydrates a bit.  3) Other labs look ok ______

## 2015-10-03 ENCOUNTER — Other Ambulatory Visit: Payer: Self-pay | Admitting: Family Medicine

## 2015-10-04 ENCOUNTER — Other Ambulatory Visit: Payer: Self-pay | Admitting: Family Medicine

## 2015-10-09 ENCOUNTER — Encounter: Payer: Self-pay | Admitting: *Deleted

## 2015-10-21 NOTE — Progress Notes (Signed)
HPI: FU coronary artery disease and preoperative evaluation. Patient is status post coronary artery bypass graft in 2015 in [place] (LIMA to LAD, RIMA to OM and SVG to RPL). Since last seen, the patient denies any dyspnea on exertion, orthopnea, PND, pedal edema, palpitations, syncope or chest pain.   Current Outpatient Prescriptions  Medication Sig Dispense Refill  . allopurinol (ZYLOPRIM) 300 MG tablet Take 1 tablet by mouth  daily 90 tablet 1  . aspirin 81 MG tablet Take 1 tablet (81 mg total) by mouth daily. Due for PCP follow up soon. 90 tablet 3  . atorvastatin (LIPITOR) 80 MG tablet Take 1 tablet (80 mg total) by mouth daily. NEED FOLLOW UP APPOINTMENT FOR MORE REFILLS 30 tablet 0  . clopidogrel (PLAVIX) 75 MG tablet Take 1 tablet by mouth  daily 90 tablet 1  . DULoxetine (CYMBALTA) 60 MG capsule Take 1 capsule by mouth  daily 90 capsule 1  . gabapentin (NEURONTIN) 300 MG capsule Take 2 capsules by mouth 3  times daily 540 capsule 1  . HYDROcodone-acetaminophen (NORCO/VICODIN) 5-325 MG tablet Take 1 tablet by mouth every 6 (six) hours as needed. 30 tablet 0  . levothyroxine (SYNTHROID, LEVOTHROID) 75 MCG tablet Take 1 tablet by mouth  daily before breakfast 90 tablet 1  . lidocaine (LIDODERM) 5 % Place 1 patch onto the skin daily. Remove & Discard patch within 12 hours or as directed by MD    . pantoprazole (PROTONIX) 40 MG tablet Take 1 tablet by mouth  daily 90 tablet 0  . topiramate (TOPAMAX) 50 MG tablet      No current facility-administered medications for this visit.     Past Medical History  Diagnosis Date  . S/P CABG x 3 07/09/2014  . Hypothyroidism 07/09/2014  . Gout 07/09/2014  . CAD (coronary artery disease)   . Hyperlipidemia   . GERD (gastroesophageal reflux disease)   . Depression   . Anxiety   . Fibromyalgia   . Cervical strain 11/21/2014  . Cervicogenic headache 11/21/2014  . Memory difficulty 02/17/2015    Past Surgical History  Procedure  Laterality Date  . Cervical fusion    . Knee surgery    . Back surgery    . Coronary artery bypass graft  March 2015    Performed in [place]  . Rotator cuff repair    . Cataract extraction Bilateral     Social History   Social History  . Marital Status: Married    Spouse Name: N/A  . Number of Children: 4  . Years of Education: Master's   Occupational History  . [employer]     Sales   Social History Main Topics  . Smoking status: Former Smoker    Quit date: 04/12/1985  . Smokeless tobacco: Never Used  . Alcohol Use: No  . Drug Use: No  . Sexual Activity:    Partners: Female   Other Topics Concern  . Not on file   Social History Narrative   Patient drinks 1 cup of caffeine daily.   Patient is right handed.    Family History  Problem Relation Age of Onset  . Heart attack Father   . Stroke Mother   . Thyroid disease Mother   . Breast cancer Sister   . Thyroid disease Brother   . Thyroid disease Sister   . Thyroid disease Sister     ROS: Chronic neck pain but no fevers or chills, productive cough, hemoptysis, dysphasia,  odynophagia, melena, hematochezia, dysuria, hematuria, rash, seizure activity, orthopnea, PND, pedal edema, claudication. Remaining systems are negative.  Physical Exam: Well-developed well-nourished in no acute distress.  Skin is warm and dry.  HEENT is normal.  Neck is supple.  Chest is clear to auscultation with normal expansion.  Cardiovascular exam is regular rate and rhythm.  Abdominal exam nontender or distended. No masses palpated. Extremities show no edema. neuro grossly intact  ECG-Sinus rhythm at a rate75. Nonspecific ST changes.   Assessment and plan  1 coronary artery disease status post coronary artery bypass and graft. Continue aspirin and statin. Discontinue plavix. Patient is able to walk 3 miles 4 times weekly with no chest pain.  2 hyperlipidemia-continue statin.  3 preoperative evaluation-I have been asked  whether patient may proceed with colonoscopy, EGD and epidural injection. His Plavix has been discontinued. He may proceed without further evaluation.  Kirk Ruths, MD

## 2015-10-23 ENCOUNTER — Ambulatory Visit (INDEPENDENT_AMBULATORY_CARE_PROVIDER_SITE_OTHER): Payer: 59 | Admitting: Cardiology

## 2015-10-23 ENCOUNTER — Encounter: Payer: Self-pay | Admitting: Cardiology

## 2015-10-23 ENCOUNTER — Telehealth: Payer: Self-pay | Admitting: *Deleted

## 2015-10-23 VITALS — BP 116/74 | HR 75 | Ht 70.0 in | Wt 197.0 lb

## 2015-10-23 DIAGNOSIS — Z01810 Encounter for preprocedural cardiovascular examination: Secondary | ICD-10-CM

## 2015-10-23 DIAGNOSIS — E785 Hyperlipidemia, unspecified: Secondary | ICD-10-CM | POA: Diagnosis not present

## 2015-10-23 DIAGNOSIS — I2510 Atherosclerotic heart disease of native coronary artery without angina pectoris: Secondary | ICD-10-CM

## 2015-10-23 NOTE — Patient Instructions (Signed)
Medication Instructions:   STOP PLAVIX  Follow-Up:  Your physician wants you to follow-up in: ONE YEAR WITH DR CRENSHAW You will receive a reminder letter in the mail two months in advance. If you don't receive a letter, please call our office to schedule the follow-up appointment.   If you need a refill on your cardiac medications before your next appointment, please call your pharmacy.    

## 2015-10-23 NOTE — Telephone Encounter (Signed)
Office note with clearance faxed to the numbers provided.

## 2015-11-11 ENCOUNTER — Encounter: Payer: Self-pay | Admitting: Family Medicine

## 2015-11-17 ENCOUNTER — Other Ambulatory Visit: Payer: Self-pay | Admitting: Family Medicine
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
## 2015-12-08 ENCOUNTER — Ambulatory Visit (INDEPENDENT_AMBULATORY_CARE_PROVIDER_SITE_OTHER): Payer: 59 | Admitting: Adult Health

## 2015-12-08 ENCOUNTER — Encounter: Payer: Self-pay | Admitting: Adult Health

## 2015-12-08 VITALS — BP 121/77 | HR 63 | Ht 70.0 in | Wt 193.6 lb

## 2015-12-08 DIAGNOSIS — R51 Headache: Secondary | ICD-10-CM

## 2015-12-08 DIAGNOSIS — M542 Cervicalgia: Secondary | ICD-10-CM | POA: Diagnosis not present

## 2015-12-08 DIAGNOSIS — G4486 Cervicogenic headache: Secondary | ICD-10-CM

## 2015-12-08 NOTE — Progress Notes (Signed)
I have read the note, and I agree with the clinical assessment and plan.  Meyli Boice KEITH   
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
## 2015-12-08 NOTE — Patient Instructions (Signed)
Continue To follow with pain specialist Please let us know if memory does improve with better control of pain If your symptoms worsen or you develop new symptoms please let us know.

## 2015-12-18 ENCOUNTER — Telehealth: Payer: Self-pay

## 2015-12-18 NOTE — Telephone Encounter (Signed)
Please make an appointment

## 2015-12-19 ENCOUNTER — Encounter: Payer: Self-pay | Admitting: Physician Assistant

## 2015-12-19 ENCOUNTER — Ambulatory Visit (INDEPENDENT_AMBULATORY_CARE_PROVIDER_SITE_OTHER): Payer: 59 | Admitting: Physician Assistant

## 2015-12-19 VITALS — BP 119/62 | HR 61 | Temp 97.5°F | Ht 70.0 in | Wt 196.0 lb

## 2015-12-19 DIAGNOSIS — J069 Acute upper respiratory infection, unspecified: Secondary | ICD-10-CM

## 2015-12-19 MED ORDER — AMOXICILLIN-POT CLAVULANATE 875-125 MG PO TABS: 1.0000 | ORAL_TABLET | Freq: Two times a day (BID) | ORAL | 0 refills | Status: DC

## 2015-12-19 NOTE — Patient Instructions (Signed)
Upper Respiratory Infection, Adult Most upper respiratory infections (URIs) are a viral infection of the air passages leading to the lungs. A URI affects the nose, throat, and upper air passages. The most common type of URI is nasopharyngitis and is typically referred to as "the common cold." URIs run their course and usually go away on their own. Most of the time, a URI does not require medical attention, but sometimes a bacterial infection in the upper airways can follow a viral infection. This is called a secondary infection. Sinus and middle ear infections are common types of secondary upper respiratory infections. Bacterial pneumonia can also complicate a URI. A URI can worsen asthma and chronic obstructive pulmonary disease (COPD). Sometimes, these complications can require emergency medical care and may be life threatening.  CAUSES Almost all URIs are caused by viruses. A virus is a type of germ and can spread from one person to another.  RISKS FACTORS You may be at risk for a URI if:   You smoke.   You have chronic heart or lung disease.  You have a weakened defense (immune) system.   You are very young or very old.   You have nasal allergies or asthma.  You work in crowded or poorly ventilated areas.  You work in health care facilities or schools. SIGNS AND SYMPTOMS  Symptoms typically develop 2-3 days after you come in contact with a cold virus. Most viral URIs last 7-10 days. However, viral URIs from the influenza virus (flu virus) can last 14-18 days and are typically more severe. Symptoms may include:   Runny or stuffy (congested) nose.   Sneezing.   Cough.   Sore throat.   Headache.   Fatigue.   Fever.   Loss of appetite.   Pain in your forehead, behind your eyes, and over your cheekbones (sinus pain).  Muscle aches.  DIAGNOSIS  Your health care provider may diagnose a URI by:  Physical exam.  Tests to check that your symptoms are not due to  another condition such as:  Strep throat.  Sinusitis.  Pneumonia.  Asthma. TREATMENT  A URI goes away on its own with time. It cannot be cured with medicines, but medicines may be prescribed or recommended to relieve symptoms. Medicines may help:  Reduce your fever.  Reduce your cough.  Relieve nasal congestion. HOME CARE INSTRUCTIONS   Take medicines only as directed by your health care provider.   Gargle warm saltwater or take cough drops to comfort your throat as directed by your health care provider.  Use a warm mist humidifier or inhale steam from a shower to increase air moisture. This may make it easier to breathe.  Drink enough fluid to keep your urine clear or pale yellow.   Eat soups and other clear broths and maintain good nutrition.   Rest as needed.   Return to work when your temperature has returned to normal or as your health care provider advises. You may need to stay home longer to avoid infecting others. You can also use a face mask and careful hand washing to prevent spread of the virus.  Increase the usage of your inhaler if you have asthma.   Do not use any tobacco products, including cigarettes, chewing tobacco, or electronic cigarettes. If you need help quitting, ask your health care provider. PREVENTION  The best way to protect yourself from getting a cold is to practice good hygiene.   Avoid oral or hand contact with people with cold   symptoms.   Wash your hands often if contact occurs.  There is no clear evidence that vitamin C, vitamin E, echinacea, or exercise reduces the chance of developing a cold. However, it is always recommended to get plenty of rest, exercise, and practice good nutrition.  SEEK MEDICAL CARE IF:   You are getting worse rather than better.   Your symptoms are not controlled by medicine.   You have chills.  You have worsening shortness of breath.  You have brown or red mucus.  You have yellow or brown nasal  discharge.  You have pain in your face, especially when you bend forward.  You have a fever.  You have swollen neck glands.  You have pain while swallowing.  You have white areas in the back of your throat. SEEK IMMEDIATE MEDICAL CARE IF:   You have severe or persistent:  Headache.  Ear pain.  Sinus pain.  Chest pain.  You have chronic lung disease and any of the following:  Wheezing.  Prolonged cough.  Coughing up blood.  A change in your usual mucus.  You have a stiff neck.  You have changes in your:  Vision.  Hearing.  Thinking.  Mood. MAKE SURE YOU:   Understand these instructions.  Will watch your condition.  Will get help right away if you are not doing well or get worse.   This information is not intended to replace advice given to you by your health care provider. Make sure you discuss any questions you have with your health care provider.   Document Released: 09/22/2000 Document Revised: 08/13/2014 Document Reviewed: 07/04/2013 Elsevier Interactive Patient Education 2016 Elsevier Inc.  

## 2015-12-19 NOTE — Progress Notes (Signed)
   Subjective:    Patient ID: John Jefferson, male    DOB: 08-23-1955, 60 y.o.   MRN: [id number]  HPI Patient is a 60 year old male who presents to the clinic with sore throat, sinus pressure, cough, rhinorrhea and ear pain for the last 7 days. He has been taking Alka-Seltzer cold and sinus. He has had no relief. He feels like his symptoms could be moving into his chest. He has a history of bronchitis. He denies any fever, chills, nausea, myalgias.   Review of Systems See HPI.     Objective:   Physical Exam  Constitutional: He is oriented to person, place, and time. He appears well-developed and well-nourished.  HENT:  Head: Normocephalic and atraumatic.  Right Ear: External ear normal.  Left Ear: External ear normal.  Nose: Nose normal.  Mouth/Throat: Oropharynx is clear and moist. No oropharyngeal exudate.  Bilateral nasal turbinates red and swollen with rhinorrhea.  Tenderness over bilaterally maxillary sinuses.  TM's erythematous, good light reflex.  No tenderness to palpation.   Eyes: Conjunctivae are normal. Right eye exhibits no discharge. Left eye exhibits no discharge.  Neck: Normal range of motion. Neck supple. No thyromegaly present.  Bilateral adenopathy without tenderness.   Cardiovascular: Normal rate, regular rhythm and normal heart sounds.   Pulmonary/Chest: Effort normal and breath sounds normal. He has no wheezes.  Lymphadenopathy:    He has cervical adenopathy.  Neurological: He is alert and oriented to person, place, and time.  Skin: Skin is dry.  Psychiatric: He has a normal mood and affect. His behavior is normal.          Assessment & Plan:  Acute upper respiratory infection- symptoms have been present for the last7 days. I do think this is moving into more of a bacterial cause. Handout given for symptomatic care. Augmentin for 10 days was sent to the pharmacy. Follow-up with PCP as needed. Discussed symptomatic care for cough with honey and/or Delsym.

## 2015-12-19 NOTE — Telephone Encounter (Signed)
Pt.notified

## 2015-12-22 ENCOUNTER — Other Ambulatory Visit: Payer: Self-pay | Admitting: Family Medicine

## 2015-12-23 ENCOUNTER — Other Ambulatory Visit: Payer: Self-pay | Admitting: Family Medicine

## 2016-01-06 ENCOUNTER — Other Ambulatory Visit: Payer: Self-pay | Admitting: Family Medicine

## 2016-01-20 ENCOUNTER — Ambulatory Visit (INDEPENDENT_AMBULATORY_CARE_PROVIDER_SITE_OTHER): Payer: 59 | Admitting: Family Medicine

## 2016-01-20 ENCOUNTER — Encounter: Payer: Self-pay | Admitting: Family Medicine

## 2016-01-20 VITALS — BP 139/86 | HR 84 | Wt 215.0 lb

## 2016-01-20 DIAGNOSIS — R509 Fever, unspecified: Secondary | ICD-10-CM | POA: Diagnosis not present

## 2016-01-20 DIAGNOSIS — R6889 Other general symptoms and signs: Secondary | ICD-10-CM

## 2016-01-20 DIAGNOSIS — D72829 Elevated white blood cell count, unspecified: Secondary | ICD-10-CM

## 2016-01-20 DIAGNOSIS — M791 Myalgia, unspecified site: Secondary | ICD-10-CM

## 2016-01-20 LAB — POCT INFLUENZA A/B
Influenza A, POC: NEGATIVE
Influenza B, POC: NEGATIVE

## 2016-01-20 MED ORDER — DOXYCYCLINE HYCLATE 100 MG PO TABS: 100.0000 mg | ORAL_TABLET | Freq: Two times a day (BID) | ORAL | 0 refills | Status: DC

## 2016-01-20 NOTE — Progress Notes (Signed)
John Jefferson is a 60 y.o. male who presents to [facility] today for body aches fevers chills and diarrhea. Symptoms present for the last 3 days. Patient is use Tylenol as that have helped. Temperatures have been as high as 103F. Additionally he notes a small bug bite on his right calf. He's not sure if it was a tick bite or not. He feels well otherwise.   Past Medical History:  Diagnosis Date  . Anxiety   . CAD (coronary artery disease)   . Cervical strain 11/21/2014  . Cervicogenic headache 11/21/2014  . Depression   . Fibromyalgia   . GERD (gastroesophageal reflux disease)   . Gout 07/09/2014  . Hyperlipidemia   . Hypothyroidism 07/09/2014  . Memory difficulty 02/17/2015  . S/P CABG x 3 07/09/2014   Past Surgical History:  Procedure Laterality Date  . BACK SURGERY    . CATARACT EXTRACTION Bilateral   . CERVICAL FUSION    . CORONARY ARTERY BYPASS GRAFT  March 2015   Performed in [place]  . KNEE SURGERY    . ROTATOR CUFF REPAIR     Social History  Substance Use Topics  . Smoking status: Former Smoker    Quit date: 04/12/1985  . Smokeless tobacco: Never Used  . Alcohol use No   family history includes Breast cancer in his sister; Heart attack in his father; Stroke in his mother; Thyroid disease in his brother, mother, sister, and sister.  ROS as above:  Medications: Current Outpatient Prescriptions  Medication Sig Dispense Refill  . allopurinol (ZYLOPRIM) 300 MG tablet Take 1 tablet by mouth  daily 90 tablet 1  . amoxicillin-clavulanate (AUGMENTIN) 875-125 MG tablet Take 1 tablet by mouth 2 (two) times daily. For 10 days. 20 tablet 0  . aspirin 81 MG tablet Take 1 tablet (81 mg total) by mouth daily. Due for PCP follow up soon. 90 tablet 3  . atorvastatin (LIPITOR) 80 MG tablet Take 1 tablet by mouth  daily 60 tablet 0  . DULoxetine (CYMBALTA) 60 MG  capsule Take 1 capsule by mouth  daily 90 capsule 1  . gabapentin (NEURONTIN) 300 MG capsule Take 2 capsules by mouth 3  times daily 540 capsule 1  . HYDROcodone-acetaminophen (NORCO/VICODIN) 5-325 MG tablet Take 1 tablet by mouth every 6 (six) hours as needed. 30 tablet 0  . levothyroxine (SYNTHROID, LEVOTHROID) 75 MCG tablet Take 1 tablet by mouth  daily before breakfast 90 tablet 1  . lidocaine (LIDODERM) 5 % Place 1 patch onto the skin daily. Remove & Discard patch within 12 hours or as directed by MD    . pantoprazole (PROTONIX) 40 MG tablet Take 1 tablet by mouth  daily 90 tablet 0  . topiramate (TOPAMAX) 50 MG tablet     . doxycycline (VIBRA-TABS) 100 MG tablet Take 1 tablet (100 mg total) by mouth 2 (two) times daily. 14 tablet 0   No current facility-administered medications for this visit.    No Known Allergies   Exam:  BP 139/86   Pulse 84   Wt 215 lb (97.5 kg)   BMI 30.85 kg/m  Gen: Well NAD Nontoxic appearing HEENT: EOMI,  MMM posterior pharynx with cobblestoning. Normal tympanic membranes bilaterally. No significant cervical lymphadenopathy is present. Lungs: Normal work of breathing. CTABL Heart: RRR no MRG Abd: NABS, Soft. Nondistended, Nontender Exts: Brisk capillary refill, warm and well perfused.  Skin right leg small erythematous area stranded  by bruise right calf.   Results for orders placed or performed in visit on 01/20/16 (from the past 24 hour(s))  POCT Influenza A/B     Status: None   Collection Time: 01/20/16  5:05 PM  Result Value Ref Range   Influenza A, POC Negative Negative   Influenza B, POC Negative Negative   No results found.    Assessment and Plan: 60 y.o. male with fevers and body aches or flulike symptom. Point-of-care flu test is negative. Doubtful for tickborne illness but it's reasonable to go ahead and treat empirically with doxycycline. Obtain labs listed below and recheck in the near future if worsening.   Orders Placed This  Encounter  Procedures  . CBC with Differential/Platelet  . COMPLETE METABOLIC PANEL WITH GFR  . CK  . POCT Influenza A/B    Discussed warning signs or symptoms. Please see discharge instructions. Patient expresses understanding.

## 2016-01-20 NOTE — Patient Instructions (Signed)
Thank you for coming in today. I'm not sure exactly what is causing the fever and body aches. This is probably a virus that is not FLU.   I am treating for the possibility of a tickborne illness however with the antibiotic doxycycline. Additionally I will check labs to make sure nothing else is going on. Continue over-the-counter medications and return in a week or sooner if not better.  Muscle Pain, Adult Muscle pain (myalgia) may be caused by many things, including:  Overuse or muscle strain, especially if you are not in shape. This is the most common cause of muscle pain.  Injury.  Bruises.  Viruses, such as the flu.  Infectious diseases.  Fibromyalgia, which is a chronic condition that causes muscle tenderness, fatigue, and headache.  Autoimmune diseases, including lupus.  Certain drugs, including ACE inhibitors and statins. Muscle pain may be mild or severe. In most cases, the pain lasts only a short time and goes away without treatment. To diagnose the cause of your muscle pain, your health care provider will take your medical history. This means he or she will ask you when your muscle pain began and what has been happening. If you have not had muscle pain for very long, your health care provider may want to wait before doing much testing. If your muscle pain has lasted a long time, your health care provider may want to run tests right away. If your health care provider thinks your muscle pain may be caused by illness, you may need to have additional tests to rule out certain conditions.  Treatment for muscle pain depends on the cause. Home care is often enough to relieve muscle pain. Your health care provider may also prescribe anti-inflammatory medicine. HOME CARE INSTRUCTIONS Watch your condition for any changes. The following actions may help to lessen any discomfort you are feeling:  Only take over-the-counter or prescription medicines as directed by your health care  provider.  Apply ice to the sore muscle:  Put ice in a plastic bag.  Place a towel between your skin and the bag.  Leave the ice on for 15-20 minutes, 3-4 times a day.  You may alternate applying hot and cold packs to the muscle as directed by your health care provider.  If overuse is causing your muscle pain, slow down your activities until the pain goes away.  Remember that it is normal to feel some muscle pain after starting a workout program. Muscles that have not been used often will be sore at first.  Do regular, gentle exercises if you are not usually active.  Warm up before exercising to lower your risk of muscle pain.  Do not continue working out if the pain is very bad. Bad pain could mean you have injured a muscle. SEEK MEDICAL CARE IF:  Your muscle pain gets worse, and medicines do not help.  You have muscle pain that lasts longer than 3 days.  You have a rash or fever along with muscle pain.  You have muscle pain after a tick bite.  You have muscle pain while working out, even though you are in good physical condition.  You have redness, soreness, or swelling along with muscle pain.  You have muscle pain after starting a new medicine or changing the dose of a medicine. SEEK IMMEDIATE MEDICAL CARE IF:  You have trouble breathing.  You have trouble swallowing.  You have muscle pain along with a stiff neck, fever, and vomiting.  You have severe muscle  weakness or cannot move part of your body. MAKE SURE YOU:   Understand these instructions.  Will watch your condition.  Will get help right away if you are not doing well or get worse.   This information is not intended to replace advice given to you by your health care provider. Make sure you discuss any questions you have with your health care provider.   Document Released: 02/18/2006 Document Revised: 04/19/2014 Document Reviewed: 01/23/2013 Elsevier Interactive Patient Education [employer].

## 2016-01-21 ENCOUNTER — Ambulatory Visit (INDEPENDENT_AMBULATORY_CARE_PROVIDER_SITE_OTHER): Payer: 59

## 2016-01-21 DIAGNOSIS — R05 Cough: Secondary | ICD-10-CM

## 2016-01-21 DIAGNOSIS — M791 Myalgia: Secondary | ICD-10-CM

## 2016-01-21 DIAGNOSIS — D72829 Elevated white blood cell count, unspecified: Secondary | ICD-10-CM

## 2016-01-21 DIAGNOSIS — R6889 Other general symptoms and signs: Secondary | ICD-10-CM

## 2016-01-21 DIAGNOSIS — R509 Fever, unspecified: Secondary | ICD-10-CM

## 2016-01-21 LAB — CBC WITH DIFFERENTIAL/PLATELET
BASOS ABS: 0 {cells}/uL (ref 0–200)
Basophils Relative: 0 %
EOS ABS: 354 {cells}/uL (ref 15–500)
Eosinophils Relative: 2 %
HCT: 43.7 % (ref 38.5–50.0)
HEMOGLOBIN: 14.5 g/dL (ref 13.2–17.1)
LYMPHS ABS: 1770 {cells}/uL (ref 850–3900)
Lymphocytes Relative: 10 %
MCH: 30.2 pg (ref 27.0–33.0)
MCHC: 33.2 g/dL (ref 32.0–36.0)
MCV: 91 fL (ref 80.0–100.0)
MONOS PCT: 12 %
MPV: 10.2 fL (ref 7.5–12.5)
Monocytes Absolute: 2124 cells/uL — ABNORMAL HIGH (ref 200–950)
Neutro Abs: 13452 cells/uL — ABNORMAL HIGH (ref 1500–7800)
Neutrophils Relative %: 76 %
PLATELETS: 207 10*3/uL (ref 140–400)
RBC: 4.8 MIL/uL (ref 4.20–5.80)
RDW: 14.7 % (ref 11.0–15.0)
WBC: 17.7 10*3/uL — ABNORMAL HIGH (ref 3.8–10.8)

## 2016-01-21 LAB — COMPLETE METABOLIC PANEL WITH GFR
ALBUMIN: 4.2 g/dL (ref 3.6–5.1)
ALK PHOS: 83 U/L (ref 40–115)
ALT: 18 U/L (ref 9–46)
AST: 23 U/L (ref 10–35)
BILIRUBIN TOTAL: 1.3 mg/dL — AB (ref 0.2–1.2)
BUN: 12 mg/dL (ref 7–25)
CALCIUM: 9.4 mg/dL (ref 8.6–10.3)
CO2: 21 mmol/L (ref 20–31)
Chloride: 106 mmol/L (ref 98–110)
Creat: 1 mg/dL (ref 0.70–1.25)
GFR, EST NON AFRICAN AMERICAN: 80 mL/min (ref >=60)
Glucose, Bld: 133 mg/dL — ABNORMAL HIGH (ref 65–99)
Potassium: 4 mmol/L (ref 3.5–5.3)
Sodium: 140 mmol/L (ref 135–146)
TOTAL PROTEIN: 6.7 g/dL (ref 6.1–8.1)

## 2016-01-21 LAB — CK: Total CK: 78 U/L (ref 7–232)

## 2016-01-21 NOTE — Addendum Note (Signed)
Addended by: Gregor Hams on: 01/21/2016 07:18 AM   Modules accepted: Orders

## 2016-02-06 ENCOUNTER — Telehealth: Payer: Self-pay

## 2016-02-06 NOTE — Telephone Encounter (Signed)
We received paperwork to be completed for this pt. Please call pt and have them schedule an appointment to complete paperwork per Dr. Georgina Snell.

## 2016-02-19 ENCOUNTER — Encounter: Payer: Self-pay | Admitting: Family Medicine

## 2016-02-19 DIAGNOSIS — H3320 Serous retinal detachment, unspecified eye: Secondary | ICD-10-CM | POA: Insufficient documentation

## 2016-02-25 ENCOUNTER — Encounter: Payer: Self-pay | Admitting: Family Medicine

## 2016-02-26 ENCOUNTER — Ambulatory Visit: Payer: 59 | Admitting: Family Medicine

## 2016-02-26 ENCOUNTER — Telehealth: Payer: Self-pay | Admitting: Family Medicine

## 2016-02-26 NOTE — Telephone Encounter (Signed)
I have disability paperwork due for John Jefferson. He needs to schedule an appointment for me to fill this out correctly.

## 2016-02-26 NOTE — Telephone Encounter (Signed)
Recommendations left on vm 

## 2016-03-01 ENCOUNTER — Ambulatory Visit (INDEPENDENT_AMBULATORY_CARE_PROVIDER_SITE_OTHER): Payer: 59 | Admitting: Family Medicine

## 2016-03-01 ENCOUNTER — Encounter: Payer: Self-pay | Admitting: Family Medicine

## 2016-03-01 VITALS — BP 134/80 | HR 66 | Wt 193.0 lb

## 2016-03-01 DIAGNOSIS — M5030 Other cervical disc degeneration, unspecified cervical region: Secondary | ICD-10-CM | POA: Diagnosis not present

## 2016-03-01 NOTE — Patient Instructions (Signed)
Thank you for coming in today. Return in about 6 months for a well visit.  Return sooner if needed.   I recommend getting the flu and TDAP vaccines in the near future.  Your pharmacy can do them or you can get them here with a nurse visit.  If you get it at a pharmacy please let me know.

## 2016-03-01 NOTE — Progress Notes (Signed)
John Jefferson is a 60 y.o. male who presents to [facility] today for follow-up neck pain disability.  Patient is to be seen several times for neck pain and headache and disability. He is unable to work and has not worked now for many months. He is unable to lift push or pull with any significant strength or duration. He's had extensive workup so far with the status quo as his stable outcome.   Past Medical History:  Diagnosis Date  . Anxiety   . CAD (coronary artery disease)   . Cervical strain 11/21/2014  . Cervicogenic headache 11/21/2014  . Depression   . Fibromyalgia   . GERD (gastroesophageal reflux disease)   . Gout 07/09/2014  . Hyperlipidemia   . Hypothyroidism 07/09/2014  . Memory difficulty 02/17/2015  . S/P CABG x 3 07/09/2014   Past Surgical History:  Procedure Laterality Date  . BACK SURGERY    . CATARACT EXTRACTION Bilateral   . CERVICAL FUSION    . CORONARY ARTERY BYPASS GRAFT  March 2015   Performed in [place]  . KNEE SURGERY    . ROTATOR CUFF REPAIR     Social History  Substance Use Topics  . Smoking status: Former Smoker    Quit date: 04/12/1985  . Smokeless tobacco: Never Used  . Alcohol use No   family history includes Breast cancer in his sister; Heart attack in his father; Stroke in his mother; Thyroid disease in his brother, mother, sister, and sister.  ROS as above:  Medications: Current Outpatient Prescriptions  Medication Sig Dispense Refill  . allopurinol (ZYLOPRIM) 300 MG tablet Take 1 tablet by mouth  daily 90 tablet 1  . aspirin 81 MG tablet Take 1 tablet (81 mg total) by mouth daily. Due for PCP follow up soon. 90 tablet 3  . atorvastatin (LIPITOR) 80 MG tablet Take 1 tablet by mouth  daily 60 tablet 0  . DULoxetine (CYMBALTA) 60 MG capsule Take 1 capsule by mouth  daily 90 capsule 1  . gabapentin (NEURONTIN) 300 MG  capsule Take 2 capsules by mouth 3  times daily 540 capsule 1  . HYDROcodone-acetaminophen (NORCO/VICODIN) 5-325 MG tablet Take 1 tablet by mouth every 6 (six) hours as needed. 30 tablet 0  . levothyroxine (SYNTHROID, LEVOTHROID) 75 MCG tablet Take 1 tablet by mouth  daily before breakfast 90 tablet 1  . lidocaine (LIDODERM) 5 % Place 1 patch onto the skin daily. Remove & Discard patch within 12 hours or as directed by MD    . pantoprazole (PROTONIX) 40 MG tablet Take 1 tablet by mouth  daily 90 tablet 0  . topiramate (TOPAMAX) 50 MG tablet      No current facility-administered medications for this visit.    No Known Allergies  Health Maintenance Health Maintenance  Topic Date Due  . INFLUENZA VACCINE  12/11/2016 (Originally 11/11/2015)  . TETANUS/TDAP  12/11/2016 (Originally [birth year]/1972)  . COLONOSCOPY  10/26/2020  . ZOSTAVAX  Completed  . Hepatitis C Screening  Completed  . HIV Screening  Completed     Exam:  BP 134/80   Pulse 66   Wt 193 lb (87.5 kg)   BMI 27.69 kg/m  Gen: Well NAD HEENT: EOMI,  MMM Lungs: Normal work of breathing. CTABL Heart: RRR no MRG Abd: NABS, Soft. Nondistended, Nontender Exts: Brisk capillary refill, warm and well perfused.  C-spine: Nontender significantly limited neck motion due to pain. Neuropsych: Alert and  oriented patient has normal speech and thought process.  No results found for this or any previous visit (from the past 72 hour(s)). No results found.    Assessment and Plan: 60 y.o. male with neck pain and disability. Unable to work. Disability forms filled out. Recheck in 6 months for a wellness visit.   No orders of the defined types were placed in this encounter.   Discussed warning signs or symptoms. Please see discharge instructions. Patient expresses understanding.

## 2016-03-12 ENCOUNTER — Other Ambulatory Visit: Payer: Self-pay | Admitting: Family Medicine

## 2016-03-17 ENCOUNTER — Other Ambulatory Visit: Payer: Self-pay | Admitting: Family Medicine

## 2016-03-17 ENCOUNTER — Ambulatory Visit (INDEPENDENT_AMBULATORY_CARE_PROVIDER_SITE_OTHER): Payer: 59 | Admitting: Family Medicine

## 2016-03-17 VITALS — BP 132/85 | HR 72 | Ht 70.0 in | Wt 186.0 lb

## 2016-03-17 DIAGNOSIS — Z23 Encounter for immunization: Secondary | ICD-10-CM | POA: Diagnosis not present

## 2016-03-17 NOTE — Progress Notes (Signed)
Patient was in the office for Tdap and influenza vaccine. Both immunizations were given and documented. Patient did not have any complaints. Rhonda Cunningham,CMA

## 2016-03-19 ENCOUNTER — Other Ambulatory Visit: Payer: Self-pay | Admitting: Family Medicine

## 2016-03-19 ENCOUNTER — Telehealth: Payer: Self-pay | Admitting: *Deleted

## 2016-03-19 MED ORDER — LIDOCAINE 5 % EX PTCH: 1.0000 | MEDICATED_PATCH | CUTANEOUS | 12 refills | Status: DC

## 2016-03-19 MED ORDER — ASPIRIN 81 MG PO TABS: 81.0000 mg | ORAL_TABLET | Freq: Every day | ORAL | 0 refills | Status: DC

## 2016-03-19 NOTE — Telephone Encounter (Signed)
Medication refilled

## 2016-03-19 NOTE — Telephone Encounter (Signed)
Received refill request for lidoderm patch. This is a historical med and has to be initially prescribed by a provider.

## 2016-03-24 ENCOUNTER — Telehealth: Payer: Self-pay | Admitting: *Deleted

## 2016-03-24 NOTE — Telephone Encounter (Signed)
I need more information in order to complete PA on this med.John Jefferson prescribed by another provider What does he used the patch for? What other meds has he used for the condition? Were they ineffective or caused side effects? Message left on vm

## 2016-03-25 ENCOUNTER — Telehealth: Payer: Self-pay | Admitting: *Deleted

## 2016-03-25 NOTE — Telephone Encounter (Signed)
PA form faxed for lidoderm patch

## 2016-04-02 NOTE — Telephone Encounter (Signed)
Received fax for more information I filled out form and faxed it back - CF

## 2016-04-06 NOTE — Telephone Encounter (Signed)
Received fax from [facility] and they have denied coverage on Lidocaine patches due to patient has to have a diagnoses of Post-herpetic neuralgia or Neuropathic pain. - CF  PA- [id number]

## 2016-04-09 ENCOUNTER — Emergency Department (INDEPENDENT_AMBULATORY_CARE_PROVIDER_SITE_OTHER)
Admission: EM | Admit: 2016-04-09 | Discharge: 2016-04-09 | Disposition: A | Discharge status: Home / Self Care | Payer: 59 | Source: Home / Self Care | Attending: Family Medicine | Admitting: Family Medicine

## 2016-04-09 ENCOUNTER — Encounter: Payer: Self-pay | Admitting: Emergency Medicine

## 2016-04-09 DIAGNOSIS — J019 Acute sinusitis, unspecified: Secondary | ICD-10-CM | POA: Diagnosis not present

## 2016-04-09 MED ORDER — BENZONATATE 100 MG PO CAPS: 100.0000 mg | ORAL_CAPSULE | Freq: Three times a day (TID) | ORAL | 0 refills | Status: DC

## 2016-04-09 MED ORDER — DOXYCYCLINE HYCLATE 100 MG PO CAPS: 100.0000 mg | ORAL_CAPSULE | Freq: Two times a day (BID) | ORAL | 0 refills | Status: DC

## 2016-04-09 NOTE — ED Provider Notes (Signed)
CSN: [id number]     Arrival date & time 04/09/16  1227 History   First MD Initiated Contact with Patient 04/09/16 1311     Chief Complaint  Patient presents with  . Facial Pain   (Consider location/radiation/quality/duration/timing/severity/associated sxs/prior Treatment) HPI  Oaklen Gallihugh is a 60 y.o. male presenting to UC with c/o 1 week of worsening sinus congestion with facial pain and pressure as well as thick yellow mucous.  Hx of sinus infections in the past. He has tried OTC cough/cold medications and tylenol with mild relief.  Denies fever, chills, n/v/d.    Past Medical History:  Diagnosis Date  . Anxiety   . CAD (coronary artery disease)   . Cervical strain 11/21/2014  . Cervicogenic headache 11/21/2014  . Depression   . Fibromyalgia   . GERD (gastroesophageal reflux disease)   . Gout 07/09/2014  . Hyperlipidemia   . Hypothyroidism 07/09/2014  . Memory difficulty 02/17/2015  . S/P CABG x 3 07/09/2014   Past Surgical History:  Procedure Laterality Date  . BACK SURGERY    . CATARACT EXTRACTION Bilateral   . CERVICAL FUSION    . CORONARY ARTERY BYPASS GRAFT  March 2015   Performed in [place]  . KNEE SURGERY    . ROTATOR CUFF REPAIR     Family History  Problem Relation Age of Onset  . Heart attack Father   . Stroke Mother   . Thyroid disease Mother   . Breast cancer Sister   . Thyroid disease Brother   . Thyroid disease Sister   . Thyroid disease Sister    Social History  Substance Use Topics  . Smoking status: Former Smoker    Quit date: 04/12/1985  . Smokeless tobacco: Never Used  . Alcohol use No    Review of Systems  Constitutional: Negative for chills and fever.  HENT: Positive for ear pain ( bilateral pressure), postnasal drip, rhinorrhea, sinus pain, sinus pressure and sore throat. Negative for congestion, trouble swallowing and voice change.   Respiratory: Positive for cough. Negative for shortness of breath.   Cardiovascular: Negative for chest  pain and palpitations.  Gastrointestinal: Negative for abdominal pain, diarrhea, nausea and vomiting.  Musculoskeletal: Negative for arthralgias, back pain and myalgias.  Skin: Negative for rash.  Neurological: Positive for headaches. Negative for dizziness and light-headedness.    Allergies  Patient has no known allergies.  Home Medications   Prior to Admission medications   Medication Sig Start Date End Date Taking? Authorizing Provider  allopurinol (ZYLOPRIM) 300 MG tablet TAKE 1 TABLET BY MOUTH  DAILY 03/17/16   Gregor Hams, MD  aspirin 81 MG tablet Take 1 tablet (81 mg total) by mouth daily. 03/19/16   Gregor Hams, MD  atorvastatin (LIPITOR) 80 MG tablet Take 1 tablet by mouth  daily 12/23/15   Gregor Hams, MD  benzonatate (TESSALON) 100 MG capsule Take 1-2 capsules (100-200 mg total) by mouth every 8 (eight) hours. 04/09/16   Noland Fordyce, PA-C  clopidogrel (PLAVIX) 75 MG tablet TAKE 1 TABLET BY MOUTH  DAILY 03/17/16   Gregor Hams, MD  doxycycline (VIBRAMYCIN) 100 MG capsule Take 1 capsule (100 mg total) by mouth 2 (two) times daily. One po bid x 7 days 04/09/16   Noland Fordyce, PA-C  DULoxetine (CYMBALTA) 60 MG capsule TAKE 1 CAPSULE BY MOUTH  DAILY 03/23/16   Gregor Hams, MD  gabapentin (NEURONTIN) 300 MG capsule Take 2 capsules by mouth 3  times daily 07/28/15  Gregor Hams, MD  HYDROcodone-acetaminophen (NORCO/VICODIN) 5-325 MG tablet Take 1 tablet by mouth every 6 (six) hours as needed. 07/29/15   Gregor Hams, MD  levothyroxine (SYNTHROID, LEVOTHROID) 75 MCG tablet TAKE 1 TABLET BY MOUTH  DAILY BEFORE BREAKFAST 03/17/16   Gregor Hams, MD  lidocaine (LIDODERM) 5 % Place 1 patch onto the skin daily. Remove & Discard patch within 12 hours or as directed by MD 03/19/16   Gregor Hams, MD  pantoprazole (PROTONIX) 40 MG tablet TAKE 1 TABLET BY MOUTH  DAILY 03/16/16   Gregor Hams, MD  topiramate (TOPAMAX) 50 MG tablet  08/07/15   Historical Provider, MD   Meds Ordered and Administered  this Visit  Medications - No data to display  BP 109/73 (BP Location: Right Arm)   Pulse 92   Temp 98.1 F (36.7 C) (Oral)   Wt 191 lb (86.6 kg)   SpO2 96%   BMI 27.41 kg/m  No data found.   Physical Exam  Constitutional: He appears well-developed and well-nourished. No distress.  HENT:  Head: Normocephalic and atraumatic.  Right Ear: Tympanic membrane normal.  Left Ear: Tympanic membrane normal.  Nose: Mucosal edema present. Right sinus exhibits maxillary sinus tenderness and frontal sinus tenderness. Left sinus exhibits maxillary sinus tenderness and frontal sinus tenderness.  Mouth/Throat: Uvula is midline, oropharynx is clear and moist and mucous membranes are normal.  Eyes: Conjunctivae are normal. No scleral icterus.  Neck: Normal range of motion. Neck supple.  Cardiovascular: Normal rate, regular rhythm and normal heart sounds.   Pulmonary/Chest: Effort normal and breath sounds normal. No respiratory distress. He has no wheezes. He has no rales.  Musculoskeletal: Normal range of motion.  Neurological: He is alert.  Skin: Skin is warm and dry. He is not diaphoretic.  Nursing note and vitals reviewed.   Urgent Care Course   Clinical Course     Procedures (including critical care time)  Labs Review Labs Reviewed - No data to display  Imaging Review No results found.   MDM   1. Acute rhinosinusitis    Pt c/o worsening sinus pain and pressure with congestion. Sinus tenderness noted on exam.  Will cover for bacterial cause.   Rx: Doxycycline (has had in the past and does well, prefers over trying Augmentin) and tessalon  Encouraged f/u with PCP in 1 week if not improving.    Noland Fordyce, PA-C 04/09/16 1435

## 2016-04-09 NOTE — ED Triage Notes (Signed)
Pt c/o yellow mucous when coughing and facial pain and pressure for the last week , denies fever.

## 2016-06-29 ENCOUNTER — Other Ambulatory Visit: Payer: Self-pay | Admitting: Family Medicine

## 2016-07-14 ENCOUNTER — Telehealth: Payer: Self-pay | Admitting: Family Medicine

## 2016-07-14 NOTE — Telephone Encounter (Signed)
Disability form filled out

## 2016-07-20 ENCOUNTER — Encounter: Payer: Self-pay | Admitting: Family Medicine

## 2016-07-20 ENCOUNTER — Ambulatory Visit (INDEPENDENT_AMBULATORY_CARE_PROVIDER_SITE_OTHER): Payer: 59 | Admitting: Family Medicine

## 2016-07-20 VITALS — BP 110/73 | HR 79

## 2016-07-20 DIAGNOSIS — M545 Low back pain: Secondary | ICD-10-CM

## 2016-07-20 DIAGNOSIS — G8929 Other chronic pain: Secondary | ICD-10-CM | POA: Diagnosis not present

## 2016-07-20 DIAGNOSIS — M542 Cervicalgia: Secondary | ICD-10-CM

## 2016-07-20 DIAGNOSIS — M5459 Other low back pain: Secondary | ICD-10-CM

## 2016-07-20 DIAGNOSIS — M1A9XX0 Chronic gout, unspecified, without tophus (tophi): Secondary | ICD-10-CM

## 2016-07-20 NOTE — Progress Notes (Signed)
John Jefferson is a 61 y.o. male who presents to [facility]: [place] today for follow-up neck pain and disability.  Patient has been seen several times for chronic neck pain resulting in mental fogginess and decreased cognition. He had an extensive workup that included neurocognitive testing. Fundamentally he has severe chronic neck pain due to severe degenerative disc disease. He is attempting to resume work but has been unable to previously. He's been not working now for several months and feels pretty well. He is doing minimal physical therapy type exercises at the gym daily. He does not lift anything more than about 10 or 15 pounds. He notes that he cannot tolerate heavy lifting heavy overhead lifting prolonged standing or sitting. With his current level of activity he has chronic daily pain but overall feels pretty well. When he tries to do more activities his pain worsens and is unable to work comfortably. He does not do significant heavy-duty activities around the home or volunteering.   Past Medical History:  Diagnosis Date  . Anxiety   . CAD (coronary artery disease)   . Cervical strain 11/21/2014  . Cervicogenic headache 11/21/2014  . Depression   . Fibromyalgia   . GERD (gastroesophageal reflux disease)   . Gout 07/09/2014  . Hyperlipidemia   . Hypothyroidism 07/09/2014  . Memory difficulty 02/17/2015  . S/P CABG x 3 07/09/2014   Past Surgical History:  Procedure Laterality Date  . BACK SURGERY    . CATARACT EXTRACTION Bilateral   . CERVICAL FUSION    . CORONARY ARTERY BYPASS GRAFT  March 2015   Performed in [place]  . KNEE SURGERY    . ROTATOR CUFF REPAIR     Social History  Substance Use Topics  . Smoking status: Former Smoker    Quit date: 04/12/1985  . Smokeless tobacco: Never Used  . Alcohol use No   family history includes Breast cancer in his sister;  Heart attack in his father; Stroke in his mother; Thyroid disease in his brother, mother, sister, and sister.  ROS as above:  Medications: Current Outpatient Prescriptions  Medication Sig Dispense Refill  . allopurinol (ZYLOPRIM) 300 MG tablet TAKE 1 TABLET BY MOUTH  DAILY 90 tablet 3  . ASPIR-LOW 81 MG EC tablet TAKE 1 TABLET BY MOUTH  DAILY 120 tablet 3  . aspirin 81 MG tablet Take 1 tablet (81 mg total) by mouth daily. 90 tablet 0  . atorvastatin (LIPITOR) 80 MG tablet TAKE 1 TABLET BY MOUTH  DAILY 60 tablet 0  . benzonatate (TESSALON) 100 MG capsule Take 1-2 capsules (100-200 mg total) by mouth every 8 (eight) hours. 21 capsule 0  . clopidogrel (PLAVIX) 75 MG tablet TAKE 1 TABLET BY MOUTH  DAILY 90 tablet 3  . doxycycline (VIBRAMYCIN) 100 MG capsule Take 1 capsule (100 mg total) by mouth 2 (two) times daily. One po bid x 7 days 14 capsule 0  . DULoxetine (CYMBALTA) 60 MG capsule TAKE 1 CAPSULE BY MOUTH  DAILY 90 capsule 2  . gabapentin (NEURONTIN) 300 MG capsule TAKE 2 CAPSULES BY MOUTH 3  TIMES DAILY 540 capsule 3  . HYDROcodone-acetaminophen (NORCO/VICODIN) 5-325 MG tablet Take 1 tablet by mouth every 6 (six) hours as needed. 30 tablet 0  . levothyroxine (SYNTHROID, LEVOTHROID) 75 MCG tablet TAKE 1 TABLET BY MOUTH  DAILY BEFORE BREAKFAST 90 tablet 3  . lidocaine (LIDODERM) 5 % Place 1 patch onto the skin daily. Remove &  Discard patch within 12 hours or as directed by MD 30 patch 12  . pantoprazole (PROTONIX) 40 MG tablet TAKE 1 TABLET BY MOUTH  DAILY 90 tablet 3  . topiramate (TOPAMAX) 50 MG tablet      No current facility-administered medications for this visit.    No Known Allergies  Health Maintenance Health Maintenance  Topic Date Due  . INFLUENZA VACCINE  11/10/2016  . COLONOSCOPY  10/26/2020  . TETANUS/TDAP  03/17/2026  . Hepatitis C Screening  Completed  . HIV Screening  Completed     Exam:  BP 110/73   Pulse 79  Gen: Well NAD HEENT: EOMI,  MMM Lungs: Normal  work of breathing. CTABL Heart: RRR no MRG Abd: NABS, Soft. Nondistended, Nontender Exts: Brisk capillary refill, warm and well perfused.  C-spine: Nontender to midline significantly decreased motion. Upper extremity strength is intact however patient does experience pain with arm and shoulder abduction bilaterally. Lumbar spine nontender to midline decreased motion. Lower shin strength is intact. Normal gait.  EXAM: MRI CERVICAL SPINE WITHOUT CONTRAST  TECHNIQUE: Multiplanar, multisequence MR imaging of the cervical spine was performed. No intravenous contrast was administered.  COMPARISON:  CT cervical spine earlier this same day.  FINDINGS: As seen on the comparison study, the patient is status post C4-7 fusion. The levels are solidly fused. Vertebral body height and alignment are maintained. There is some artifact from postoperative change. Bone marrow signal is otherwise unremarkable. The craniocervical junction is normal and cervical cord signal is normal. There is no evidence of ligamentous injury. Imaged paraspinous structures demonstrate degenerative change about the sternoclavicular joints, worse on the right.  C2-3: No disc bulge or protrusion. The central canal and foramina are open. The facet joints are ankylosed, more confluent on the left.  C3-4: Minimal disc bulge without central canal or foraminal stenosis. There is some facet degenerative change, worse on the right.  C4-5: Status post discectomy and fusion. The central canal and foramina are widely patent.  C5-6: Status post discectomy and fusion. The central canal and foramina are widely patent.  C6-7: Status post discectomy and fusion the central canal is widely patent. Uncovertebral disease causes mild bilateral foraminal narrowing.  C7-T1: Minimal disc bulge without central canal or foraminal stenosis.  IMPRESSION: No acute abnormality. No finding to explain the patient's left  upper extremity symptoms.  Status post C4-7 fusion. The central canal is widely patent at all levels. Mild bilateral foraminal narrowing at C6-7 is identified.   Electronically Signed   By: Inge Rise M.D.   On: 10/08/2014 15:03   Assessment and Plan: 61 y.o. male with  Neck and back pain due to chronic degenerative disc disease resulting in disability. Disability form filled out. Continue current level of activity. Avoid heavy duty work or lifting.  Return in the near future for a wellness exam. Obtain basic fasting labs in the interim.    Discussed warning signs or symptoms. Please see discharge instructions. Patient expresses understanding.

## 2016-07-20 NOTE — Patient Instructions (Signed)
Thank you for coming in today. Get labs in late May or early June.  Return for a well exam in late May or early June.   Return sooner if needed.

## 2016-07-30 ENCOUNTER — Other Ambulatory Visit: Payer: Self-pay | Admitting: Family Medicine

## 2016-08-24 LAB — PSA: PSA: 0.4 ng/mL (ref <=4.0)

## 2016-08-24 LAB — CBC
HCT: 46.1 % (ref 38.5–50.0)
HEMOGLOBIN: 15.1 g/dL (ref 13.2–17.1)
MCH: 29.9 pg (ref 27.0–33.0)
MCHC: 32.8 g/dL (ref 32.0–36.0)
MCV: 91.3 fL (ref 80.0–100.0)
MPV: 10.2 fL (ref 7.5–12.5)
Platelets: 196 10*3/uL (ref 140–400)
RBC: 5.05 MIL/uL (ref 4.20–5.80)
RDW: 14.6 % (ref 11.0–15.0)
WBC: 7 10*3/uL (ref 3.8–10.8)

## 2016-08-25 LAB — COMPLETE METABOLIC PANEL WITH GFR
ALBUMIN: 4.5 g/dL (ref 3.6–5.1)
ALK PHOS: 70 U/L (ref 40–115)
ALT: 20 U/L (ref 9–46)
AST: 25 U/L (ref 10–35)
BILIRUBIN TOTAL: 0.5 mg/dL (ref 0.2–1.2)
BUN: 15 mg/dL (ref 7–25)
CALCIUM: 9.7 mg/dL (ref 8.6–10.3)
CO2: 21 mmol/L (ref 20–31)
CREATININE: 1.11 mg/dL (ref 0.70–1.25)
Chloride: 107 mmol/L (ref 98–110)
GFR, Est African American: 82 mL/min (ref >=60)
GFR, Est Non African American: 71 mL/min (ref >=60)
GLUCOSE: 94 mg/dL (ref 65–99)
Potassium: 4.6 mmol/L (ref 3.5–5.3)
SODIUM: 143 mmol/L (ref 135–146)
TOTAL PROTEIN: 6.8 g/dL (ref 6.1–8.1)

## 2016-08-25 LAB — LIPID PANEL W/REFLEX DIRECT LDL
CHOLESTEROL: 125 mg/dL (ref <200)
HDL: 42 mg/dL (ref >40)
LDL-CHOLESTEROL: 63 mg/dL
NON-HDL CHOLESTEROL (CALC): 83 mg/dL (ref <130)
Total CHOL/HDL Ratio: 3 Ratio (ref <5.0)
Triglycerides: 112 mg/dL (ref <150)

## 2016-08-25 LAB — VITAMIN D 25 HYDROXY (VIT D DEFICIENCY, FRACTURES): Vit D, 25-Hydroxy: 50 ng/mL (ref 30–100)

## 2016-08-25 LAB — URIC ACID: Uric Acid, Serum: 5.1 mg/dL (ref 4.0–8.0)

## 2016-08-25 LAB — TSH: TSH: 1.08 m[IU]/L (ref 0.40–4.50)

## 2016-08-30 ENCOUNTER — Encounter: Payer: 59 | Admitting: Family Medicine

## 2016-08-31 ENCOUNTER — Ambulatory Visit (INDEPENDENT_AMBULATORY_CARE_PROVIDER_SITE_OTHER): Payer: 59 | Admitting: Family Medicine

## 2016-08-31 ENCOUNTER — Encounter: Payer: Self-pay | Admitting: Family Medicine

## 2016-08-31 VITALS — BP 124/66 | HR 74 | Wt 194.0 lb

## 2016-08-31 DIAGNOSIS — Z0000 Encounter for general adult medical examination without abnormal findings: Secondary | ICD-10-CM

## 2016-08-31 MED ORDER — ATORVASTATIN CALCIUM 80 MG PO TABS: 80.0000 mg | ORAL_TABLET | Freq: Every day | ORAL | 3 refills | Status: DC

## 2016-08-31 NOTE — Progress Notes (Signed)
John Jefferson is a 61 y.o. male who presents to [facility]: [place] today for well adult visit. Patient is doing well. He is no longer working as he is unable to due to his chronic neck pain and headaches. He does however exercise daily. He exercises about 45 minutes of cardio 4-5 days a week and 45 minutes of mild strength training 4-5 days a week. He is not able to lift heavy weights but the limited exercises that he is doing has reduced his neck pain. He does not think that he can work with his current pain but overall is doing quite well. He notes his mood has improved as has his general health. He continues to express chronic daily pain but overall is improved compared to where he was this time last year. Medications listed below and feels well. He does try to a lower carbohydrate diet.   Past Medical History:  Diagnosis Date  . Anxiety   . CAD (coronary artery disease)   . Cervical strain 11/21/2014  . Cervicogenic headache 11/21/2014  . Depression   . Fibromyalgia   . GERD (gastroesophageal reflux disease)   . Gout 07/09/2014  . Hyperlipidemia   . Hypothyroidism 07/09/2014  . Memory difficulty 02/17/2015  . S/P CABG x 3 07/09/2014   Past Surgical History:  Procedure Laterality Date  . BACK SURGERY    . CATARACT EXTRACTION Bilateral   . CERVICAL FUSION    . CORONARY ARTERY BYPASS GRAFT  March 2015   Performed in [place]  . KNEE SURGERY    . ROTATOR CUFF REPAIR     Social History  Substance Use Topics  . Smoking status: Former Smoker    Quit date: 04/12/1985  . Smokeless tobacco: Never Used  . Alcohol use No   family history includes Breast cancer in his sister; Heart attack in his father; Stroke in his mother; Thyroid disease in his brother, mother, sister, and sister.  ROS as above:  Medications: Current Outpatient Prescriptions  Medication Sig Dispense Refill   . allopurinol (ZYLOPRIM) 300 MG tablet TAKE 1 TABLET BY MOUTH  DAILY 90 tablet 3  . aspirin 81 MG tablet Take 1 tablet (81 mg total) by mouth daily. 90 tablet 0  . atorvastatin (LIPITOR) 80 MG tablet Take 1 tablet (80 mg total) by mouth daily. DUE FOR LAB WORK 30 tablet 0  . clopidogrel (PLAVIX) 75 MG tablet TAKE 1 TABLET BY MOUTH  DAILY 90 tablet 3  . DULoxetine (CYMBALTA) 60 MG capsule TAKE 1 CAPSULE BY MOUTH  DAILY 90 capsule 2  . gabapentin (NEURONTIN) 300 MG capsule TAKE 2 CAPSULES BY MOUTH 3  TIMES DAILY 540 capsule 3  . HYDROcodone-acetaminophen (NORCO/VICODIN) 5-325 MG tablet Take 1 tablet by mouth every 6 (six) hours as needed. 30 tablet 0  . levothyroxine (SYNTHROID, LEVOTHROID) 75 MCG tablet TAKE 1 TABLET BY MOUTH  DAILY BEFORE BREAKFAST 90 tablet 3  . lidocaine (LIDODERM) 5 % Place 1 patch onto the skin daily. Remove & Discard patch within 12 hours or as directed by MD 30 patch 12  . pantoprazole (PROTONIX) 40 MG tablet TAKE 1 TABLET BY MOUTH  DAILY 90 tablet 3  . topiramate (TOPAMAX) 50 MG tablet      No current facility-administered medications for this visit.    No Known Allergies  Health Maintenance Health Maintenance  Topic Date Due  . INFLUENZA VACCINE  11/10/2016  . COLONOSCOPY  10/26/2020  .  TETANUS/TDAP  03/17/2026  . Hepatitis C Screening  Completed  . HIV Screening  Completed     Exam:  BP 124/66   Pulse 74   Wt 194 lb (88 kg)   BMI 27.84 kg/m   Wt Readings from Last 10 Encounters:  08/31/16 194 lb (88 kg)  04/09/16 191 lb (86.6 kg)  03/17/16 186 lb (84.4 kg)  03/01/16 193 lb (87.5 kg)  01/20/16 215 lb (97.5 kg)  12/19/15 196 lb (88.9 kg)  12/08/15 193 lb 9.6 oz (87.8 kg)  10/23/15 197 lb (89.4 kg)  08/26/15 195 lb (88.5 kg)  06/26/15 205 lb (93 kg)    Gen: Well NAD HEENT: EOMI,  MMM Lungs: Normal work of breathing. CTABL Heart: RRR no MRG Abd: NABS, Soft. Nondistended, Nontender Exts: Brisk capillary refill, warm and well perfused.    Depression screen [facility] 2/9 08/31/2016 08/26/2015  Decreased Interest 0 0  Down, Depressed, Hopeless 0 0  PHQ - 2 Score 0 0         Chemistry      Component Value Date/Time   NA 143 08/24/2016 0946   K 4.6 08/24/2016 0946   CL 107 08/24/2016 0946   CO2 21 08/24/2016 0946   BUN 15 08/24/2016 0946   CREATININE 1.11 08/24/2016 0946      Component Value Date/Time   CALCIUM 9.7 08/24/2016 0946   ALKPHOS 70 08/24/2016 0946   AST 25 08/24/2016 0946   ALT 20 08/24/2016 0946   BILITOT 0.5 08/24/2016 0946     Lab Results  Component Value Date   CHOL 125 08/24/2016   HDL 42 08/24/2016   LDLCALC 53 08/26/2015   TRIG 112 08/24/2016   CHOLHDL 3.0 08/24/2016   Lab Results  Component Value Date   TSH 1.08 08/24/2016   Lab Results  Component Value Date   WBC 7.0 08/24/2016   HGB 15.1 08/24/2016   HCT 46.1 08/24/2016   MCV 91.3 08/24/2016   PLT 196 08/24/2016      Assessment and Plan: 61 y.o. male with Well adult. Doing well continue current regimen. Recheck annually or sooner if needed.   No orders of the defined types were placed in this encounter.  No orders of the defined types were placed in this encounter.    Discussed warning signs or symptoms. Please see discharge instructions. Patient expresses understanding.

## 2016-08-31 NOTE — Patient Instructions (Signed)
Thank you for coming in today. We will keep up with your labs.  Pay attention to urination.  If you drink less fluid do you urinate less?  Labs look great.  Continue to exercise.  Keep the carbs down.   You are due for a flu vaccine this fall.

## 2016-09-27 ENCOUNTER — Telehealth: Payer: Self-pay | Admitting: Family Medicine

## 2016-09-27 NOTE — Telephone Encounter (Signed)
Received phone call from Dr. Prince Solian from [employer] disability. They are handling a claim on the Pt. He would like to speak with PCP regarding restrictions, etc for Pt.  Dr. Doristine Section cell: [phone].

## 2016-09-28 NOTE — Telephone Encounter (Signed)
I spoke with Dr Oletha Blend

## 2016-12-11 ENCOUNTER — Other Ambulatory Visit: Payer: Self-pay | Admitting: Family Medicine

## 2016-12-17 ENCOUNTER — Other Ambulatory Visit: Payer: Self-pay | Admitting: Family Medicine

## 2017-04-19 ENCOUNTER — Telehealth: Payer: Self-pay | Admitting: Family Medicine

## 2017-04-19 NOTE — Telephone Encounter (Signed)
Patient called advised he has [employer] and was told he needs to have a welcome to medicare visit. Should he wait for the part-time employee who will be handling these wellness checks or should I schedule this wellness check with you since you are pcp? Please route to a nurse to call pt back just in case I am not in the office. Pt has a new mail [employer] and no longer will use [facility]. Thanks

## 2017-04-20 NOTE — Telephone Encounter (Signed)
He should see me

## 2017-04-21 ENCOUNTER — Other Ambulatory Visit: Payer: Self-pay

## 2017-04-21 MED ORDER — DULOXETINE HCL 60 MG PO CPEP: 60.0000 mg | ORAL_CAPSULE | Freq: Every day | ORAL | 1 refills | Status: DC

## 2017-04-21 MED ORDER — ATORVASTATIN CALCIUM 80 MG PO TABS: 80.0000 mg | ORAL_TABLET | Freq: Every day | ORAL | 1 refills | Status: DC

## 2017-04-21 MED ORDER — PANTOPRAZOLE SODIUM 40 MG PO TBEC: 40.0000 mg | DELAYED_RELEASE_TABLET | Freq: Every day | ORAL | 1 refills | Status: DC

## 2017-04-21 MED ORDER — LEVOTHYROXINE SODIUM 75 MCG PO TABS: ORAL_TABLET | ORAL | 1 refills | Status: DC

## 2017-04-21 MED ORDER — ALLOPURINOL 300 MG PO TABS: 300.0000 mg | ORAL_TABLET | Freq: Every day | ORAL | 1 refills | Status: DC

## 2017-04-21 NOTE — Telephone Encounter (Signed)
Rx's sent for 6 month supply. Pt due for follow up appt in May 2019. Left VM for Pt with update.

## 2017-04-21 NOTE — Addendum Note (Signed)
Addended by: Huel Cote on: 04/21/2017 09:36 AM   Modules accepted: Orders

## 2017-04-21 NOTE — Telephone Encounter (Signed)
PT needs all mail order scripts [place] [address] Fax: [phone] [phone]  He needs a 90 day supply sent to them for the following medications: Levothyroxine Tab .075mg  Pantoprazole Sodium Delayed Release  40mg  Atorvastatin Tab 80mg  Allopurinol Tab 300mg  Duloxetine Cap 60mg   Please call pt to notify him this has been taken care of.

## 2017-04-21 NOTE — Telephone Encounter (Signed)
Patient has been informed. Rhonda Cunningham,CMA  

## 2017-04-25 DIAGNOSIS — M791 Myalgia, unspecified site: Secondary | ICD-10-CM | POA: Diagnosis not present

## 2017-04-25 DIAGNOSIS — G894 Chronic pain syndrome: Secondary | ICD-10-CM | POA: Diagnosis not present

## 2017-04-25 DIAGNOSIS — M50123 Cervical disc disorder at C6-C7 level with radiculopathy: Secondary | ICD-10-CM | POA: Diagnosis not present

## 2017-04-25 DIAGNOSIS — M47892 Other spondylosis, cervical region: Secondary | ICD-10-CM | POA: Diagnosis not present

## 2017-04-29 ENCOUNTER — Telehealth: Payer: Self-pay

## 2017-04-29 NOTE — Telephone Encounter (Signed)
Patient called stated that his Duloxetine 60 mg is a tier 2 with his Medicare plan, He is requesting a new Rx for generic Celexa (a tier1) 90 day supply sent to his mail order. Please advise. Rhonda Cunningham,CMA

## 2017-04-29 NOTE — Telephone Encounter (Signed)
Are we switching away from Duloxetine to Citalopram (celexa)? Or are we adding it to Duloxetine?  Ellard Artis

## 2017-04-29 NOTE — Telephone Encounter (Signed)
Switch it to Celexa. Rhonda Cunningham,CMA

## 2017-05-02 NOTE — Telephone Encounter (Signed)
Gave patient advise as noted below. Patient stated that he will just stick with the Duloxetine for now. Shanielle Correll,CMA

## 2017-05-02 NOTE — Telephone Encounter (Signed)
Celexa is not likely to work nearly as well for pain as Duloxetine will. The both will work for depression about as well. I do not recommend switching without an office visit to discuss this in person.  Please have John Jefferson schedule an appointment with me or Dr Darene Lamer

## 2017-05-10 ENCOUNTER — Other Ambulatory Visit: Payer: Self-pay

## 2017-05-10 MED ORDER — GABAPENTIN 300 MG PO CAPS: ORAL_CAPSULE | ORAL | 3 refills | Status: DC

## 2017-05-30 DIAGNOSIS — M7062 Trochanteric bursitis, left hip: Secondary | ICD-10-CM | POA: Diagnosis not present

## 2017-05-30 DIAGNOSIS — M542 Cervicalgia: Secondary | ICD-10-CM | POA: Diagnosis not present

## 2017-05-30 DIAGNOSIS — M791 Myalgia, unspecified site: Secondary | ICD-10-CM | POA: Diagnosis not present

## 2017-06-27 DIAGNOSIS — M542 Cervicalgia: Secondary | ICD-10-CM | POA: Diagnosis not present

## 2017-06-27 DIAGNOSIS — G894 Chronic pain syndrome: Secondary | ICD-10-CM | POA: Diagnosis not present

## 2017-06-27 DIAGNOSIS — M5033 Other cervical disc degeneration, cervicothoracic region: Secondary | ICD-10-CM | POA: Diagnosis not present

## 2017-07-26 DIAGNOSIS — M7061 Trochanteric bursitis, right hip: Secondary | ICD-10-CM | POA: Diagnosis not present

## 2017-07-26 DIAGNOSIS — M545 Low back pain: Secondary | ICD-10-CM | POA: Diagnosis not present

## 2017-07-26 DIAGNOSIS — M542 Cervicalgia: Secondary | ICD-10-CM | POA: Diagnosis not present

## 2017-07-26 DIAGNOSIS — G894 Chronic pain syndrome: Secondary | ICD-10-CM | POA: Diagnosis not present

## 2017-08-30 DIAGNOSIS — G894 Chronic pain syndrome: Secondary | ICD-10-CM | POA: Diagnosis not present

## 2017-08-30 DIAGNOSIS — M7061 Trochanteric bursitis, right hip: Secondary | ICD-10-CM | POA: Diagnosis not present

## 2017-08-30 DIAGNOSIS — M7062 Trochanteric bursitis, left hip: Secondary | ICD-10-CM | POA: Diagnosis not present

## 2017-08-30 DIAGNOSIS — M542 Cervicalgia: Secondary | ICD-10-CM | POA: Diagnosis not present

## 2017-08-30 DIAGNOSIS — M4722 Other spondylosis with radiculopathy, cervical region: Secondary | ICD-10-CM | POA: Diagnosis not present

## 2017-09-12 ENCOUNTER — Telehealth: Payer: Self-pay

## 2017-09-12 NOTE — Telephone Encounter (Signed)
Patient has an appointment scheduled for this week and he would like an order to get his labs done ahead of time. Please advise as patient would like to get those done this morning. Rhonda Cunningham,CMA

## 2017-09-13 ENCOUNTER — Ambulatory Visit (INDEPENDENT_AMBULATORY_CARE_PROVIDER_SITE_OTHER): Payer: PPO | Admitting: Family Medicine

## 2017-09-13 ENCOUNTER — Encounter: Payer: Self-pay | Admitting: Family Medicine

## 2017-09-13 VITALS — BP 114/71 | HR 70 | Ht 70.0 in | Wt 197.0 lb

## 2017-09-13 DIAGNOSIS — K2270 Barrett's esophagus without dysplasia: Secondary | ICD-10-CM | POA: Diagnosis not present

## 2017-09-13 DIAGNOSIS — M1A9XX0 Chronic gout, unspecified, without tophus (tophi): Secondary | ICD-10-CM | POA: Diagnosis not present

## 2017-09-13 DIAGNOSIS — I25810 Atherosclerosis of coronary artery bypass graft(s) without angina pectoris: Secondary | ICD-10-CM | POA: Diagnosis not present

## 2017-09-13 DIAGNOSIS — M542 Cervicalgia: Secondary | ICD-10-CM | POA: Diagnosis not present

## 2017-09-13 DIAGNOSIS — Z0000 Encounter for general adult medical examination without abnormal findings: Secondary | ICD-10-CM

## 2017-09-13 DIAGNOSIS — E039 Hypothyroidism, unspecified: Secondary | ICD-10-CM

## 2017-09-13 DIAGNOSIS — Z125 Encounter for screening for malignant neoplasm of prostate: Secondary | ICD-10-CM | POA: Diagnosis not present

## 2017-09-13 DIAGNOSIS — K2100 Gastro-esophageal reflux disease with esophagitis, without bleeding: Secondary | ICD-10-CM

## 2017-09-13 DIAGNOSIS — G8929 Other chronic pain: Secondary | ICD-10-CM

## 2017-09-13 DIAGNOSIS — E782 Mixed hyperlipidemia: Secondary | ICD-10-CM

## 2017-09-13 DIAGNOSIS — H524 Presbyopia: Secondary | ICD-10-CM | POA: Diagnosis not present

## 2017-09-13 NOTE — Telephone Encounter (Signed)
Appt today too late

## 2017-09-13 NOTE — Patient Instructions (Signed)
Thank you for coming in today. Continue current treatment.  Apply hydrocortisone to the rash.  Recheck with me yearly or sooner if needed.  Get labs today.  Make sure pain management is sending office notes to me.

## 2017-09-13 NOTE — Progress Notes (Signed)
HPI: John Jefferson is a 62 y.o. male  who presents to [facility] today, 09/13/17,  for Medicare Annual Wellness Exam  Patient presents for annual physical/Medicare wellness exam.    John Jefferson has long standing neck pain and chronic headaches that are being managed by a pain management specialist. The pain is stable and not worsening. He is still able to exercise regularly. He does cardio regularly as well as strength training.   Rash: John Jefferson noted a small rash on his left inner groin that has been itchy. He denies pain. He denies bug bites. He has not put anything on it.    Past medical, surgical, social and family history reviewed:  Patient Active Problem List   Diagnosis Date Noted  . Chronic neck pain 07/20/2016  . Retinal detachment 02/19/2016  . Barrett's esophagus 08/26/2015  . Memory difficulty 02/17/2015  . Lumbar facet joint pain 07/25/2014  . CAD (coronary artery disease) 07/25/2014  . Vitamin D deficiency 07/25/2014  . Degenerative disc disease, cervical 07/25/2014  . S/P CABG x 3 07/09/2014  . Hypothyroidism 07/09/2014  . Hyperlipidemia 07/09/2014  . GERD (gastroesophageal reflux disease) 07/09/2014  . Gout 07/09/2014    Past Surgical History:  Procedure Laterality Date  . BACK SURGERY    . CATARACT EXTRACTION Bilateral   . CERVICAL FUSION    . CORONARY ARTERY BYPASS GRAFT  March 2015   Performed in [place]  . KNEE SURGERY    . ROTATOR CUFF REPAIR      Social History   Socioeconomic History  . Marital status: Married    Spouse name: Not on file  . Number of children: 4  . Years of education: Master's  . Highest education level: Not on file  Occupational History  . 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 Financial resource strain: Not on file  . Food insecurity:    Worry: Not on file    Inability: Not on file  . Transportation needs:    Medical: Not on file    Non-medical: Not on  file  Tobacco Use  . Smoking status: Former Smoker    Last attempt to quit: 04/12/1985    Years since quitting: 32.4  . Smokeless tobacco: Never Used  Substance and Sexual Activity  . Alcohol use: No    Alcohol/week: 0.0 oz  . Drug use: No  . Sexual activity: Yes    Partners: Female  Lifestyle  . Physical activity:    Days per week: Not on file    Minutes per session: Not on file  . Stress: Not on file  Relationships  . Social connections:    Talks on phone: Not on file    Gets together: Not on file    Attends religious service: Not on file    Active member of club or organization: Not on file    Attends meetings of clubs or organizations: Not on file    Relationship status: Not on file  . Intimate partner violence:    Fear of current or ex partner: Not on file    Emotionally abused: Not on file    Physically abused: Not on file    Forced sexual activity: Not on file  Other Topics Concern  . Not on file  Social History Narrative   Patient drinks 1 cup of caffeine daily.   Patient is right handed.    Family History  Problem Relation Age of Onset  .  Heart attack Father   . Stroke Mother   . Thyroid disease Mother   . Breast cancer Sister   . Thyroid disease Brother   . Thyroid disease Sister   . Thyroid disease Sister      Current medication list and allergy/intolerance information reviewed:    Outpatient Encounter Medications as of 09/13/2017  Medication Sig Note  . allopurinol (ZYLOPRIM) 300 MG tablet Take 1 tablet (300 mg total) by mouth daily.   Marland Kitchen aspirin 81 MG tablet Take 1 tablet (81 mg total) by mouth daily.   Marland Kitchen atorvastatin (LIPITOR) 80 MG tablet Take 1 tablet (80 mg total) by mouth daily.   . clopidogrel (PLAVIX) 75 MG tablet TAKE 1 TABLET BY MOUTH  DAILY   . DULoxetine (CYMBALTA) 60 MG capsule Take 1 capsule (60 mg total) by mouth daily.   . fentaNYL (DURAGESIC - DOSED MCG/HR) 25 MCG/HR patch UNW AND APP 1 PA TO SKIN Q 72 H. REMOVE OLD PATCHES B ADDING  NEW PA   . HYDROcodone-acetaminophen (NORCO/VICODIN) 5-325 MG tablet Take 1 tablet by mouth every 6 (six) hours as needed.   Marland Kitchen levothyroxine (SYNTHROID, LEVOTHROID) 75 MCG tablet TAKE 1 TABLET BY MOUTH  DAILY BEFORE BREAKFAST   . pantoprazole (PROTONIX) 40 MG tablet Take 1 tablet (40 mg total) by mouth daily.   Marland Kitchen topiramate (TOPAMAX) 50 MG tablet  08/26/2015: Received from: External Pharmacy  . [DISCONTINUED] gabapentin (NEURONTIN) 300 MG capsule TAKE 2 CAPSULES BY MOUTH 3  TIMES DAILY   . [DISCONTINUED] lidocaine (LIDODERM) 5 % Place 1 patch onto the skin daily. Remove & Discard patch within 12 hours or as directed by MD   . gabapentin (NEURONTIN) 300 MG capsule Take 600 mg by mouth 3 (three) times daily.    No facility-administered encounter medications on file as of 09/13/2017.     No Known Allergies     Review of Systems: No headache, visual changes, nausea, vomiting, diarrhea, constipation, dizziness, abdominal pain, skin rash, fevers, chills, night sweats, weight loss, swollen lymph nodes, body aches, joint swelling, muscle aches, chest pain, shortness of breath, mood changes, visual or auditory hallucinations.     Medicare Wellness Questionnaire  Are there smokers in your home (other than you)? no  Depression screen [facility] 2/9 08/31/2016 08/26/2015  Decreased Interest 0 0  Down, Depressed, Hopeless 0 0  PHQ - 2 Score 0 0        Activities of Daily Living In your present state of health, do you have any difficulty performing the following activities?:  Driving? no Managing money?  no Feeding yourself? no Getting from bed to chair? no Climbing a flight of stairs? Yes Preparing food and eating?: no Bathing or showering? no Getting dressed: no Getting to the toilet? no Using the toilet: no Moving around from place to place: no In the past year have you fallen or had a near fall?: no  Hearing Difficulties:  Do you often ask people to speak up or repeat themselves? no Do  you experience ringing or noises in your ears? no  Do you have difficulty understanding soft or whispered voices? no  Memory Difficulties:  Do you feel that you have a problem with memory? yes  Do you often misplace items? yes  Do you feel safe at home?  yes  Sexual Health:   Are you sexually active?  Yes  Do you have more than one partner?  No   Risk Factors  Current exercise habits: exercises 4-5 times  a week with cardio and mild strength training.   Dietary issues discussed:Yes  Cardiac risk factors: known cardiac disease   Exam:  BP 114/71   Pulse 70   Ht 5\' 10"  (1.778 m)   Wt 197 lb (89.4 kg)   BMI 28.27 kg/m  Vision by Snellen chart: right eye:see nurse notes, left eye:see nurse notes  Constitutional: VS see above. General Appearance: alert, well-developed, well-nourished, NAD  Ears, Nose, Mouth, Throat: MMM  Neck: No masses, trachea midline.   Respiratory: Normal respiratory effort. no wheeze, no rhonchi, no rales  Cardiovascular:No lower extremity edema.   Musculoskeletal: Gait normal. No clubbing/cyanosis of digits.   Neurological: Normal balance/coordination. No tremor. Recalls 3 objects and able to read face of watch with correct time.   Skin: warm, dry, intact. Small 1cm erythematous macule on his right inner thigh.  Vitiligo present upper extremities  Psychiatric: Normal judgment/insight. Normal mood and affect. Oriented x3. Passed minicog     ASSESSMENT/PLAN:   Mr. Star[facility] is a 62 y.o. Male with a history of chronic neck pain, CAD, GERD, and gout who is here today for a medicare wellness visit. He seems to be relatively healthy with no new major problems. He is exercising and taking care of himself appropriately.   Rash: The rash is one lesion and is not concerning at this time. Patient has not tried any topical medications at this time. I believe over the counter hydrocortisone cream will treat this well. If it is not working, he can call the office  and we will prescribe a stronger steroid cream.   Neck Pain: Patient's neck pain is stable and unchanged since the last visit we had. He is currently being managed by pain management. If the pain worsens or he develops new neurological symptoms, he should contact the office.    Encounter for Medicare annual wellness exam  Coronary artery disease involving coronary bypass graft of native heart without angina pectoris - Plan: CBC, COMPLETE METABOLIC PANEL WITH GFR, Lipid Panel w/reflex Direct LDL, PSA, TSH, Uric acid  Gastroesophageal reflux disease with esophagitis - Plan: CBC, COMPLETE METABOLIC PANEL WITH GFR, Lipid Panel w/reflex Direct LDL, PSA, TSH, Uric acid  Barrett's esophagus without dysplasia - Plan: CBC, COMPLETE METABOLIC PANEL WITH GFR, Lipid Panel w/reflex Direct LDL, PSA, TSH, Uric acid  Hypothyroidism, unspecified type - Plan: CBC, COMPLETE METABOLIC PANEL WITH GFR, Lipid Panel w/reflex Direct LDL, PSA, TSH, Uric acid  Mixed hyperlipidemia - Plan: CBC, COMPLETE METABOLIC PANEL WITH GFR, Lipid Panel w/reflex Direct LDL, PSA, TSH, Uric acid  Chronic gout without tophus, unspecified cause, unspecified site - Plan: CBC, COMPLETE METABOLIC PANEL WITH GFR, Lipid Panel w/reflex Direct LDL, PSA, TSH, Uric acid  Chronic neck pain - Plan: CBC, COMPLETE METABOLIC PANEL WITH GFR, Lipid Panel w/reflex Direct LDL, PSA, TSH, Uric acid  Screening for prostate cancer - Plan: CBC, COMPLETE METABOLIC PANEL WITH GFR, Lipid Panel w/reflex Direct LDL, PSA, TSH, Uric acid  Health Maintenance Health Maintenance  Topic Date Due  . INFLUENZA VACCINE  11/10/2017  . COLONOSCOPY  10/26/2020  . TETANUS/TDAP  03/17/2026  . Hepatitis C Screening  Completed  . HIV Screening  Completed    Immunization History  Administered Date(s) Administered  . Influenza,inj,Quad PF,6+ Mos 12/30/2014, 03/17/2016  . Tdap 03/17/2016  . Zoster 08/26/2015     During the course of the visit the patient was  educated and counseled about appropriate screening and preventive services as noted above.   Patient Instructions (the  written plan) was given to the patient.  Medicare Attestation I have personally reviewed: The patient's medical and social history Their use of alcohol, tobacco or illicit drugs Their current medications and supplements The patient's functional ability including ADLs,fall risks, home safety risks, cognitive, and hearing and visual impairment Diet and physical activities Evidence for depression or mood disorders  The patient's weight, height, BMI, and visual acuity have been recorded in the chart.  I have made referrals, counseling, and provided education to the patient based on review of the above and I have provided the patient with a written personalized care plan for preventive services.

## 2017-09-14 LAB — URIC ACID: Uric Acid, Serum: 4.6 mg/dL (ref 4.0–8.0)

## 2017-09-14 LAB — COMPLETE METABOLIC PANEL WITH GFR
AG Ratio: 2.2 (calc) (ref 1.0–2.5)
ALT: 24 U/L (ref 9–46)
AST: 22 U/L (ref 10–35)
Albumin: 4.6 g/dL (ref 3.6–5.1)
Alkaline phosphatase (APISO): 64 U/L (ref 40–115)
BUN: 15 mg/dL (ref 7–25)
CALCIUM: 9.7 mg/dL (ref 8.6–10.3)
CO2: 27 mmol/L (ref 20–32)
CREATININE: 1.17 mg/dL (ref 0.70–1.25)
Chloride: 108 mmol/L (ref 98–110)
GFR, Est African American: 76 mL/min/{1.73_m2} (ref >=60)
GFR, Est Non African American: 66 mL/min/{1.73_m2} (ref >=60)
Globulin: 2.1 g/dL (calc) (ref 1.9–3.7)
Glucose, Bld: 96 mg/dL (ref 65–99)
Potassium: 4.2 mmol/L (ref 3.5–5.3)
Sodium: 144 mmol/L (ref 135–146)
Total Bilirubin: 0.6 mg/dL (ref 0.2–1.2)
Total Protein: 6.7 g/dL (ref 6.1–8.1)

## 2017-09-14 LAB — CBC
HEMATOCRIT: 43.4 % (ref 38.5–50.0)
Hemoglobin: 14.4 g/dL (ref 13.2–17.1)
MCH: 29.9 pg (ref 27.0–33.0)
MCHC: 33.2 g/dL (ref 32.0–36.0)
MCV: 90.2 fL (ref 80.0–100.0)
MPV: 10 fL (ref 7.5–12.5)
PLATELETS: 209 10*3/uL (ref 140–400)
RBC: 4.81 10*6/uL (ref 4.20–5.80)
RDW: 13.2 % (ref 11.0–15.0)
WBC: 7.8 10*3/uL (ref 3.8–10.8)

## 2017-09-14 LAB — LIPID PANEL W/REFLEX DIRECT LDL
CHOL/HDL RATIO: 2.8 (calc) (ref <5.0)
Cholesterol: 123 mg/dL (ref <200)
HDL: 44 mg/dL (ref >40)
LDL Cholesterol (Calc): 57 mg/dL (calc)
Non-HDL Cholesterol (Calc): 79 mg/dL (calc) (ref <130)
TRIGLYCERIDES: 135 mg/dL (ref <150)

## 2017-09-14 LAB — TSH: TSH: 1.39 m[IU]/L (ref 0.40–4.50)

## 2017-09-14 LAB — PSA: PSA: 0.2 ng/mL (ref <=4.0)

## 2017-09-28 DIAGNOSIS — M4722 Other spondylosis with radiculopathy, cervical region: Secondary | ICD-10-CM | POA: Diagnosis not present

## 2017-09-28 DIAGNOSIS — G894 Chronic pain syndrome: Secondary | ICD-10-CM | POA: Diagnosis not present

## 2017-09-28 DIAGNOSIS — M7062 Trochanteric bursitis, left hip: Secondary | ICD-10-CM | POA: Diagnosis not present

## 2017-09-28 DIAGNOSIS — M545 Low back pain: Secondary | ICD-10-CM | POA: Diagnosis not present

## 2017-09-28 DIAGNOSIS — Z79891 Long term (current) use of opiate analgesic: Secondary | ICD-10-CM | POA: Diagnosis not present

## 2017-09-28 DIAGNOSIS — Z79899 Other long term (current) drug therapy: Secondary | ICD-10-CM | POA: Diagnosis not present

## 2017-09-28 DIAGNOSIS — M4326 Fusion of spine, lumbar region: Secondary | ICD-10-CM | POA: Diagnosis not present

## 2017-10-26 DIAGNOSIS — G894 Chronic pain syndrome: Secondary | ICD-10-CM | POA: Diagnosis not present

## 2017-10-26 DIAGNOSIS — M542 Cervicalgia: Secondary | ICD-10-CM | POA: Diagnosis not present

## 2017-10-26 DIAGNOSIS — M4722 Other spondylosis with radiculopathy, cervical region: Secondary | ICD-10-CM | POA: Diagnosis not present

## 2017-11-23 DIAGNOSIS — G894 Chronic pain syndrome: Secondary | ICD-10-CM | POA: Diagnosis not present

## 2017-11-23 DIAGNOSIS — M545 Low back pain: Secondary | ICD-10-CM | POA: Diagnosis not present

## 2017-11-23 DIAGNOSIS — M542 Cervicalgia: Secondary | ICD-10-CM | POA: Diagnosis not present

## 2017-12-21 DIAGNOSIS — G894 Chronic pain syndrome: Secondary | ICD-10-CM | POA: Diagnosis not present

## 2017-12-21 DIAGNOSIS — M5416 Radiculopathy, lumbar region: Secondary | ICD-10-CM | POA: Diagnosis not present

## 2017-12-21 DIAGNOSIS — M4722 Other spondylosis with radiculopathy, cervical region: Secondary | ICD-10-CM | POA: Diagnosis not present

## 2017-12-21 DIAGNOSIS — M545 Low back pain: Secondary | ICD-10-CM | POA: Diagnosis not present

## 2018-01-16 DIAGNOSIS — M7918 Myalgia, other site: Secondary | ICD-10-CM | POA: Diagnosis not present

## 2018-01-16 DIAGNOSIS — G894 Chronic pain syndrome: Secondary | ICD-10-CM | POA: Diagnosis not present

## 2018-01-16 DIAGNOSIS — I10 Essential (primary) hypertension: Secondary | ICD-10-CM | POA: Diagnosis not present

## 2018-01-16 DIAGNOSIS — Z6828 Body mass index (BMI) 28.0-28.9, adult: Secondary | ICD-10-CM | POA: Diagnosis not present

## 2018-01-16 DIAGNOSIS — M47816 Spondylosis without myelopathy or radiculopathy, lumbar region: Secondary | ICD-10-CM | POA: Diagnosis not present

## 2018-01-25 DIAGNOSIS — M545 Low back pain: Secondary | ICD-10-CM | POA: Diagnosis not present

## 2018-01-25 DIAGNOSIS — G894 Chronic pain syndrome: Secondary | ICD-10-CM | POA: Diagnosis not present

## 2018-01-25 DIAGNOSIS — M542 Cervicalgia: Secondary | ICD-10-CM | POA: Diagnosis not present

## 2018-01-30 DIAGNOSIS — M47816 Spondylosis without myelopathy or radiculopathy, lumbar region: Secondary | ICD-10-CM | POA: Diagnosis not present

## 2018-02-09 ENCOUNTER — Other Ambulatory Visit: Payer: Self-pay | Admitting: Family Medicine

## 2018-02-20 ENCOUNTER — Encounter: Payer: Self-pay | Admitting: Family Medicine

## 2018-02-23 DIAGNOSIS — M47016 Anterior spinal artery compression syndromes, lumbar region: Secondary | ICD-10-CM | POA: Diagnosis not present

## 2018-02-23 DIAGNOSIS — M4856XA Collapsed vertebra, not elsewhere classified, lumbar region, initial encounter for fracture: Secondary | ICD-10-CM | POA: Diagnosis not present

## 2018-02-23 DIAGNOSIS — M47896 Other spondylosis, lumbar region: Secondary | ICD-10-CM | POA: Diagnosis not present

## 2018-02-23 DIAGNOSIS — M48061 Spinal stenosis, lumbar region without neurogenic claudication: Secondary | ICD-10-CM | POA: Diagnosis not present

## 2018-02-23 DIAGNOSIS — M5126 Other intervertebral disc displacement, lumbar region: Secondary | ICD-10-CM | POA: Diagnosis not present

## 2018-02-28 DIAGNOSIS — M545 Low back pain: Secondary | ICD-10-CM | POA: Diagnosis not present

## 2018-02-28 DIAGNOSIS — M4326 Fusion of spine, lumbar region: Secondary | ICD-10-CM | POA: Diagnosis not present

## 2018-02-28 DIAGNOSIS — G894 Chronic pain syndrome: Secondary | ICD-10-CM | POA: Diagnosis not present

## 2018-02-28 DIAGNOSIS — M7062 Trochanteric bursitis, left hip: Secondary | ICD-10-CM | POA: Diagnosis not present

## 2018-03-13 ENCOUNTER — Telehealth: Payer: Self-pay | Admitting: Family Medicine

## 2018-03-13 NOTE — Telephone Encounter (Signed)
Spoke with Pt, he is scheduled to get flu shot this week.

## 2018-03-13 NOTE — Telephone Encounter (Signed)
Our records indicate that you are due for an influenza vaccine. Because of your age group you are at higher risk for dying or being hospitalized from influenza. We can reduce this risk by giving a flu vaccine.   If you have already had a flu vaccine please let me know.   If you have not had a flu vaccine please call [phone] and schedule a nurse appointment. You do not need to see me to get a flu vaccine.   If you have any questions or concerns, please don't hesitate to call.

## 2018-03-15 ENCOUNTER — Ambulatory Visit (INDEPENDENT_AMBULATORY_CARE_PROVIDER_SITE_OTHER): Payer: PPO | Admitting: Family Medicine

## 2018-03-15 DIAGNOSIS — Z23 Encounter for immunization: Secondary | ICD-10-CM | POA: Diagnosis not present

## 2018-03-28 DIAGNOSIS — M4326 Fusion of spine, lumbar region: Secondary | ICD-10-CM | POA: Diagnosis not present

## 2018-03-28 DIAGNOSIS — Z6828 Body mass index (BMI) 28.0-28.9, adult: Secondary | ICD-10-CM | POA: Diagnosis not present

## 2018-03-28 DIAGNOSIS — M545 Low back pain: Secondary | ICD-10-CM | POA: Diagnosis not present

## 2018-03-28 DIAGNOSIS — G894 Chronic pain syndrome: Secondary | ICD-10-CM | POA: Diagnosis not present

## 2018-03-31 ENCOUNTER — Telehealth: Payer: Self-pay | Admitting: Family Medicine

## 2018-03-31 NOTE — Telephone Encounter (Signed)
I spoke with the reviewer regarding John Jefferson disability application. He will be returned to work.  I disagree. Note will be sent to me for review.

## 2018-03-31 NOTE — Telephone Encounter (Signed)
Dr.Weisberg was calling in requesting to speak to Dr.Corey in regards to this patient. No further information provided.

## 2018-04-10 DIAGNOSIS — M5136 Other intervertebral disc degeneration, lumbar region: Secondary | ICD-10-CM | POA: Diagnosis not present

## 2018-04-20 ENCOUNTER — Telehealth: Payer: Self-pay | Admitting: Family Medicine

## 2018-04-20 ENCOUNTER — Other Ambulatory Visit: Payer: Self-pay | Admitting: Family Medicine

## 2018-04-20 MED ORDER — LEVOTHYROXINE SODIUM 75 MCG PO TABS: 75.0000 ug | ORAL_TABLET | Freq: Every day | ORAL | 1 refills | Status: DC

## 2018-04-20 NOTE — Telephone Encounter (Signed)
Received fax from pharmacy stating that they need to switch manufacturers of the levothyroxine.  Additionally he needs a refill.  Refill sent and letter will be faxed back today.  Patient due for TSH recheck in June 2020

## 2018-04-24 ENCOUNTER — Other Ambulatory Visit: Payer: Self-pay | Admitting: Family Medicine

## 2018-05-10 DIAGNOSIS — M542 Cervicalgia: Secondary | ICD-10-CM | POA: Diagnosis not present

## 2018-05-10 DIAGNOSIS — M4326 Fusion of spine, lumbar region: Secondary | ICD-10-CM | POA: Diagnosis not present

## 2018-05-10 DIAGNOSIS — Z79899 Other long term (current) drug therapy: Secondary | ICD-10-CM | POA: Diagnosis not present

## 2018-05-10 DIAGNOSIS — G894 Chronic pain syndrome: Secondary | ICD-10-CM | POA: Diagnosis not present

## 2018-05-10 DIAGNOSIS — M545 Low back pain: Secondary | ICD-10-CM | POA: Diagnosis not present

## 2018-05-10 DIAGNOSIS — M5136 Other intervertebral disc degeneration, lumbar region: Secondary | ICD-10-CM | POA: Diagnosis not present

## 2018-05-10 DIAGNOSIS — Z79891 Long term (current) use of opiate analgesic: Secondary | ICD-10-CM | POA: Diagnosis not present

## 2018-06-07 DIAGNOSIS — M5136 Other intervertebral disc degeneration, lumbar region: Secondary | ICD-10-CM | POA: Diagnosis not present

## 2018-06-07 DIAGNOSIS — M542 Cervicalgia: Secondary | ICD-10-CM | POA: Diagnosis not present

## 2018-06-07 DIAGNOSIS — G894 Chronic pain syndrome: Secondary | ICD-10-CM | POA: Diagnosis not present

## 2018-06-30 ENCOUNTER — Other Ambulatory Visit: Payer: Self-pay | Admitting: Family Medicine

## 2018-07-05 DIAGNOSIS — M542 Cervicalgia: Secondary | ICD-10-CM | POA: Diagnosis not present

## 2018-07-05 DIAGNOSIS — M545 Low back pain: Secondary | ICD-10-CM | POA: Diagnosis not present

## 2018-07-05 DIAGNOSIS — M5136 Other intervertebral disc degeneration, lumbar region: Secondary | ICD-10-CM | POA: Diagnosis not present

## 2018-07-05 DIAGNOSIS — G894 Chronic pain syndrome: Secondary | ICD-10-CM | POA: Diagnosis not present

## 2018-08-08 DIAGNOSIS — G894 Chronic pain syndrome: Secondary | ICD-10-CM | POA: Diagnosis not present

## 2018-08-08 DIAGNOSIS — M542 Cervicalgia: Secondary | ICD-10-CM | POA: Diagnosis not present

## 2018-08-08 DIAGNOSIS — M5136 Other intervertebral disc degeneration, lumbar region: Secondary | ICD-10-CM | POA: Diagnosis not present

## 2018-08-15 DIAGNOSIS — M5136 Other intervertebral disc degeneration, lumbar region: Secondary | ICD-10-CM | POA: Diagnosis not present

## 2018-09-06 DIAGNOSIS — M5136 Other intervertebral disc degeneration, lumbar region: Secondary | ICD-10-CM | POA: Diagnosis not present

## 2018-09-06 DIAGNOSIS — M4326 Fusion of spine, lumbar region: Secondary | ICD-10-CM | POA: Diagnosis not present

## 2018-09-06 DIAGNOSIS — G894 Chronic pain syndrome: Secondary | ICD-10-CM | POA: Diagnosis not present

## 2018-10-04 DIAGNOSIS — M542 Cervicalgia: Secondary | ICD-10-CM | POA: Diagnosis not present

## 2018-10-04 DIAGNOSIS — M5136 Other intervertebral disc degeneration, lumbar region: Secondary | ICD-10-CM | POA: Diagnosis not present

## 2018-10-04 DIAGNOSIS — G894 Chronic pain syndrome: Secondary | ICD-10-CM | POA: Diagnosis not present

## 2018-10-09 ENCOUNTER — Ambulatory Visit: Payer: PPO

## 2018-10-09 NOTE — Progress Notes (Deleted)
Subjective:   John Jefferson. is a 63 y.o. male who presents for Medicare Annual/Subsequent preventive examination.  Review of Systems:  No ROS.  Medicare Wellness Virtual Visit.  Visual/audio telehealth visit, UTA vital signs.   See social history for additional risk factors.       Sleep patterns:  Home Safety/Smoke Alarms: Feels safe in home. Smoke alarms in place.  Living environment; Seat Belt Safety/Bike Helmet: Wears seat belt.  Male:   CCS-   UTD  PSA-   UTD Lab Results  Component Value Date   PSA 0.2 09/13/2017   PSA 0.4 08/24/2016   PSA 0.30 08/26/2015        Objective:    Vitals: There were no vitals taken for this visit.  There is no height or weight on file to calculate BMI.  Advanced Directives 12/02/2014 11/21/2014 10/08/2014 07/09/2014  Does Patient Have a Medical Advance Directive? Yes Yes No Yes  Type of Paramedic of [place];Living will [place];Living will - [place];Living will;Out of facility DNR (pink MOST or yellow form)  Does patient want to make changes to medical advance directive? - - - No - Patient declined  Copy of [place] in Chart? No - copy requested - - -  Would patient like information on creating a medical advance directive? - - No - patient declined information -    Tobacco Social History   Tobacco Use  Smoking Status Former Smoker  . Quit date: 04/12/1985  . Years since quitting: 33.5  Smokeless Tobacco Never Used     Counseling given: Not Answered   Clinical Intake:                       Past Medical History:  Diagnosis Date  . Anxiety   . CAD (coronary artery disease)   . Cervical strain 11/21/2014  . Cervicogenic headache 11/21/2014  . Depression   . Fibromyalgia   . GERD (gastroesophageal reflux disease)   . Gout 07/09/2014  . Hyperlipidemia   . Hypothyroidism 07/09/2014  . Memory difficulty 02/17/2015  . S/P CABG x  3 07/09/2014   Past Surgical History:  Procedure Laterality Date  . BACK SURGERY    . CATARACT EXTRACTION Bilateral   . CERVICAL FUSION    . CORONARY ARTERY BYPASS GRAFT  March 2015   Performed in [place]  . KNEE SURGERY    . ROTATOR CUFF REPAIR     Family History  Problem Relation Age of Onset  . Heart attack Father   . Stroke Mother   . Thyroid disease Mother   . Breast cancer Sister   . Thyroid disease Brother   . Thyroid disease Sister   . Thyroid disease Sister    Social History   Socioeconomic History  . Marital status: Married    Spouse name: Not on file  . Number of children: 4  . Years of education: Master's  . Highest education level: Not on file  Occupational History  . 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 Financial resource strain: Not on file  . Food insecurity    Worry: Not on file    Inability: Not on file  . Transportation needs    Medical: Not on file    Non-medical: Not on file  Tobacco Use  . Smoking status: Former Smoker    Quit date: 04/12/1985  Years since quitting: 33.5  . Smokeless tobacco: Never Used  Substance and Sexual Activity  . Alcohol use: No    Alcohol/week: 0.0 standard drinks  . Drug use: No  . Sexual activity: Yes    Partners: Female  Lifestyle  . Physical activity    Days per week: Not on file    Minutes per session: Not on file  . Stress: Not on file  Relationships  . Social Herbalist on phone: Not on file    Gets together: Not on file    Attends religious service: Not on file    Active member of club or organization: Not on file    Attends meetings of clubs or organizations: Not on file    Relationship status: Not on file  Other Topics Concern  . Not on file  Social History Narrative   Patient drinks 1 cup of caffeine daily.   Patient is right handed.    Outpatient Encounter Medications as of 10/09/2018  Medication Sig  . allopurinol (ZYLOPRIM) 300 MG tablet Take 1 tablet  by mouth daily  . aspirin 81 MG tablet Take 1 tablet (81 mg total) by mouth daily.  Marland Kitchen atorvastatin (LIPITOR) 80 MG tablet Take 1 tablet by mouth daily  . clopidogrel (PLAVIX) 75 MG tablet TAKE 1 TABLET BY MOUTH  DAILY  . DULoxetine (CYMBALTA) 60 MG capsule Take 1 capsule by mouth daily  . fentaNYL (DURAGESIC - DOSED MCG/HR) 25 MCG/HR patch UNW AND APP 1 PA TO SKIN Q 72 H. REMOVE OLD PATCHES B ADDING NEW PA  . gabapentin (NEURONTIN) 300 MG capsule Take 2 capsules by mouth 3 times a day  . HYDROcodone-acetaminophen (NORCO/VICODIN) 5-325 MG tablet Take 1 tablet by mouth every 6 (six) hours as needed.  Marland Kitchen levothyroxine (SYNTHROID, LEVOTHROID) 75 MCG tablet Take 1 tablet by mouth daily before breakfast  . pantoprazole (PROTONIX) 40 MG tablet Take 1 tablet by mouth daily  . topiramate (TOPAMAX) 50 MG tablet    No facility-administered encounter medications on file as of 10/09/2018.     Activities of Daily Living No flowsheet data found.  Patient Care Team: Gregor Hams, MD as PCP - General (Family Medicine) Jola Baptist, Scott as Referring Physician (Chiropractic Medicine)   Assessment:   This is a routine wellness examination for Zavian.Physical assessment deferred to PCP.   Exercise Activities and Dietary recommendations   Diet  Breakfast: Lunch:  Dinner:       Goals   None     Fall Risk Fall Risk  08/31/2016 08/26/2015  Falls in the past year? No No   Is the patient's home free of loose throw rugs in walkways, pet beds, electrical cords, etc?   {Blank single:19197::"yes","no"}      Grab bars in the bathroom? {Blank single:19197::"yes","no"}      Handrails on the stairs?   {Blank single:19197::"yes","no"}      Adequate lighting?   {Blank single:19197::"yes","no"}  Depression Screen PHQ 2/9 Scores 08/31/2016 08/26/2015  PHQ - 2 Score 0 0    Cognitive Function MMSE - Mini Mental State Exam 06/09/2015 02/17/2015 11/21/2014  Orientation to time 4 5 5   Orientation to Place 5 5 5    Registration 3 3 3   Attention/ Calculation 5 5 5   Recall 3 2 3   Language- name 2 objects 2 2 2   Language- repeat 1 1 1   Language- follow 3 step command 3 3 3   Language- read & follow direction 1 1 1  Write a sentence 1 1 1   Copy design 1 1 1   Total score 29 29 30         Immunization History  Administered Date(s) Administered  . Influenza,inj,Quad PF,6+ Mos 12/30/2014, 03/17/2016, 03/15/2018  . Tdap 03/17/2016  . Zoster 08/26/2015    Screening Tests Health Maintenance  Topic Date Due  . INFLUENZA VACCINE  11/11/2018  . COLONOSCOPY  10/26/2020  . TETANUS/TDAP  03/17/2026  . Hepatitis C Screening  Completed  . HIV Screening  Completed        Plan:   ***  I have personally reviewed and noted the following in the patient's chart:   . Medical and social history . Use of alcohol, tobacco or illicit drugs  . Current medications and supplements . Functional ability and status . Nutritional status . Physical activity . Advanced directives . List of other physicians . Hospitalizations, surgeries, and ER visits in previous 12 months . Vitals . Screenings to include cognitive, depression, and falls . Referrals and appointments  In addition, I have reviewed and discussed with patient certain preventive protocols, quality metrics, and best practice recommendations. A written personalized care plan for preventive services as well as general preventive health recommendations were provided to patient.     Joanne Chars, LPN  [id number]

## 2018-10-10 DIAGNOSIS — M5136 Other intervertebral disc degeneration, lumbar region: Secondary | ICD-10-CM | POA: Diagnosis not present

## 2018-10-24 ENCOUNTER — Telehealth: Payer: Self-pay | Admitting: Cardiology

## 2018-10-24 NOTE — Telephone Encounter (Signed)
Lm about recall

## 2018-11-01 DIAGNOSIS — M542 Cervicalgia: Secondary | ICD-10-CM | POA: Diagnosis not present

## 2018-11-01 DIAGNOSIS — M5136 Other intervertebral disc degeneration, lumbar region: Secondary | ICD-10-CM | POA: Diagnosis not present

## 2018-11-01 DIAGNOSIS — G894 Chronic pain syndrome: Secondary | ICD-10-CM | POA: Diagnosis not present

## 2018-11-01 NOTE — Progress Notes (Signed)
Subjective:   John Jefferson. is a 63 y.o. male who presents for Medicare Annual/Subsequent preventive examination.  Review of Systems:  No ROS.  Medicare Wellness Virtual Visit.  Visual/audio telehealth visit, UTA vital signs.   See social history for additional risk factors.    Cardiac Risk Factors include: advanced age (>59men, >65 women);sedentary lifestyle  Sleep patterns: Getting 10 hours of sleep a night. Wakes up 3-4 times a night to void. Wakes up and feels sluggish. Home Safety/Smoke Alarms: Feels safe in home. Smoke alarms in place.  Living environment; Lives with wife in a 1 story home. No steps in the home. SHower is a walk in shower and no grab bars in place but does have a bench in place. Seat Belt Safety/Bike Helmet: Wears seat belt.    Male:   CCS- UTD    PSA-  UTD Lab Results  Component Value Date   PSA 0.2 09/13/2017   PSA 0.4 08/24/2016   PSA 0.30 08/26/2015        Objective:    Vitals: There were no vitals taken for this visit.  There is no height or weight on file to calculate BMI.  Advanced Directives 11/06/2018 12/02/2014 11/21/2014 10/08/2014 07/09/2014  Does Patient Have a Medical Advance Directive? Yes Yes Yes No Yes  Type of Paramedic of [place];Living will [place];Living will [place];Living will - [place];Living will;Out of facility DNR (pink MOST or yellow form)  Does patient want to make changes to medical advance directive? No - Patient declined - - - No - Patient declined  Copy of [place] in Chart? No - copy requested No - copy requested - - -  Would patient like information on creating a medical advance directive? - - - No - patient declined information -    Tobacco Social History   Tobacco Use  Smoking Status Former Smoker  . Quit date: 04/12/1985  . Years since quitting: 33.5  Smokeless Tobacco Never Used     Counseling  given: Not Answered   Clinical Intake:  Pre-visit preparation completed: Yes  Pain : 0-10 Pain Score: 5  Pain Type: Chronic pain Pain Location: Back Pain Orientation: Lower Pain Descriptors / Indicators: Aching, Constant, Throbbing Pain Onset: More than a month ago Pain Frequency: Constant Pain Relieving Factors: Meds help Effect of Pain on Daily Activities: effects daily activities  Pain Relieving Factors: Meds help  Nutritional Risks: None Diabetes: No  How often do you need to have someone help you when you read instructions, pamphlets, or other written materials from your doctor or pharmacy?: 1 - Never What is the last grade level you completed in school?: 18  Interpreter Needed?: No  Information entered by :: Orlie Dakin, LPN  Past Medical History:  Diagnosis Date  . Anxiety   . CAD (coronary artery disease)   . Cervical strain 11/21/2014  . Cervicogenic headache 11/21/2014  . Depression   . Fibromyalgia   . GERD (gastroesophageal reflux disease)   . Gout 07/09/2014  . Hyperlipidemia   . Hypothyroidism 07/09/2014  . Memory difficulty 02/17/2015  . S/P CABG x 3 07/09/2014   Past Surgical History:  Procedure Laterality Date  . BACK SURGERY    . CATARACT EXTRACTION Bilateral   . CERVICAL FUSION    . CORONARY ARTERY BYPASS GRAFT  March 2015   Performed in [place]  . KNEE SURGERY    . ROTATOR CUFF  REPAIR     Family History  Problem Relation Age of Onset  . Heart attack Father   . Stroke Mother   . Thyroid disease Mother   . Breast cancer Sister   . Thyroid disease Brother   . Thyroid disease Sister   . Thyroid disease Sister    Social History   Socioeconomic History  . Marital status: Married    Spouse name: Anderson Malta  . Number of children: 4  . Years of education: Master's  . Highest education level: Master's degree (e.g., MA, MS, MEng, MEd, MSW, MBA)  Occupational History  . 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 Financial  resource strain: Not hard at all  . Food insecurity    Worry: Never true    Inability: Never true  . Transportation needs    Medical: No    Non-medical: No  Tobacco Use  . Smoking status: Former Smoker    Quit date: 04/12/1985    Years since quitting: 33.5  . Smokeless tobacco: Never Used  Substance and Sexual Activity  . Alcohol use: No    Alcohol/week: 0.0 standard drinks  . Drug use: No  . Sexual activity: Not Currently    Partners: Female  Lifestyle  . Physical activity    Days per week: 2 days    Minutes per session: 40 min  . Stress: Not at all  Relationships  . Social Herbalist on phone: Once a week    Gets together: Twice a week    Attends religious service: More than 4 times per year    Active member of club or organization: No    Attends meetings of clubs or organizations: Never    Relationship status: Married  Other Topics Concern  . Not on file  Social History Narrative   Patient drinks 1 cup of caffeine daily.   Patient is right handed.    Outpatient Encounter Medications as of 11/06/2018  Medication Sig  . allopurinol (ZYLOPRIM) 300 MG tablet Take 1 tablet by mouth daily  . aspirin 81 MG tablet Take 1 tablet (81 mg total) by mouth daily.  Marland Kitchen atorvastatin (LIPITOR) 80 MG tablet Take 1 tablet by mouth daily  . clopidogrel (PLAVIX) 75 MG tablet TAKE 1 TABLET BY MOUTH  DAILY  . DULoxetine (CYMBALTA) 60 MG capsule Take 1 capsule by mouth daily  . fentaNYL (DURAGESIC - DOSED MCG/HR) 25 MCG/HR patch UNW AND APP 1 PA TO SKIN Q 72 H. REMOVE OLD PATCHES B ADDING NEW PA  . gabapentin (NEURONTIN) 300 MG capsule Take 2 capsules by mouth 3 times a day  . HYDROcodone-acetaminophen (NORCO/VICODIN) 5-325 MG tablet Take 1 tablet by mouth every 6 (six) hours as needed.  Marland Kitchen levothyroxine (SYNTHROID, LEVOTHROID) 75 MCG tablet Take 1 tablet by mouth daily before breakfast  . pantoprazole (PROTONIX) 40 MG tablet Take 1 tablet by mouth daily  . topiramate (TOPAMAX) 50 MG  tablet    No facility-administered encounter medications on file as of 11/06/2018.     Activities of Daily Living In your present state of health, do you have any difficulty performing the following activities: 11/06/2018  Hearing? Y  Comment has tinnitus so has some hearing difficulty  Vision? N  Difficulty concentrating or making decisions? Y  Walking or climbing stairs? Y  Comment with back and neck pain  Dressing or bathing? N  Doing errands, shopping? N  Preparing Food and eating ? N  Using the Toilet? N  In the past six months, have you accidently leaked urine? N  Do you have problems with loss of bowel control? N  Managing your Medications? N  Managing your Finances? N  Housekeeping or managing your Housekeeping? N  Some recent data might be hidden    Patient Care Team: Gregor Hams, MD as PCP - General (Family Medicine) Jola Baptist, [place] as Referring Physician (Chiropractic Medicine)   Assessment:   This is a routine wellness examination for Jaymir.Physical assessment deferred to PCP.   Exercise Activities and Dietary recommendations Current Exercise Habits: Home exercise routine, Type of exercise: treadmill, Time (Minutes): 60, Frequency (Times/Week): 3, Weekly Exercise (Minutes/Week): 180, Intensity: Mild Diet Eats a healthy diet at home. Breakfast: drinks a plant based organic shake Lunch: salads and [place] Dinner:  Meat and vegetables     Goals    . Patient Stated     Patient stated wants this COVID gone and be able to get out more and get back into the gym       Fall Risk Fall Risk  11/06/2018 08/31/2016 08/26/2015  Falls in the past year? 0 No No  Injury with Fall? 0 - -  Risk for fall due to : Impaired balance/gait;Impaired mobility - -  Follow up Falls prevention discussed - -   Is the patient's home free of loose throw rugs in walkways, pet beds, electrical cords, etc?   yes      Grab bars in the bathroom? no      Handrails on the stairs?   no       Adequate lighting?   yes  Depression Screen PHQ 2/9 Scores 11/06/2018 08/31/2016 08/26/2015  PHQ - 2 Score 1 0 0    Cognitive Function MMSE - Mini Mental State Exam 06/09/2015 02/17/2015 11/21/2014  Orientation to time 4 5 5   Orientation to Place 5 5 5   Registration 3 3 3   Attention/ Calculation 5 5 5   Recall 3 2 3   Language- name 2 objects 2 2 2   Language- repeat 1 1 1   Language- follow 3 step command 3 3 3   Language- read & follow direction 1 1 1   Write a sentence 1 1 1   Copy design 1 1 1   Total score 29 29 30      6CIT Screen 11/06/2018  What Year? 0 points  What month? 0 points  What time? 0 points  Count back from 20 0 points  Months in reverse 2 points  Repeat phrase 0 points  Total Score 2    Immunization History  Administered Date(s) Administered  . Influenza,inj,Quad PF,6+ Mos 12/30/2014, 03/17/2016, 03/15/2018  . Tdap 03/17/2016  . Zoster 08/26/2015    Screening Tests Health Maintenance  Topic Date Due  . INFLUENZA VACCINE  11/11/2018  . COLONOSCOPY  10/26/2020  . TETANUS/TDAP  03/17/2026  . Hepatitis C Screening  Completed  . HIV Screening  Completed        Plan:    Please schedule your next medicare wellness visit with me in 1 yr.  Mr. Hack , Thank you for taking time to come for your Medicare Wellness Visit. I appreciate your ongoing commitment to your health goals. Please review the following plan we discussed and let me know if I can assist you in the future.  Continue doing brain stimulating activities (puzzles, reading, adult coloring books, staying active) to keep memory sharp.   These are the goals we discussed: Goals    .  Patient Stated     Patient stated wants this COVID gone and be able to get out more and get back into the gym       This is a list of the screening recommended for you and due dates:  Health Maintenance  Topic Date Due  . Flu Shot  11/11/2018  . Colon Cancer Screening  10/26/2020  . Tetanus Vaccine  03/17/2026  .   Hepatitis C: One time screening is recommended by Center for Disease Control  (CDC) for  adults born from [birth year] through 1967.   Completed  . HIV Screening  Completed      I have personally reviewed and noted the following in the patient's chart:   . Medical and social history . Use of alcohol, tobacco or illicit drugs  . Current medications and supplements . Functional ability and status . Nutritional status . Physical activity . Advanced directives . List of other physicians . Hospitalizations, surgeries, and ER visits in previous 12 months . Vitals . Screenings to include cognitive, depression, and falls . Referrals and appointments  In addition, I have reviewed and discussed with patient certain preventive protocols, quality metrics, and best practice recommendations. A written personalized care plan for preventive services as well as general preventive health recommendations were provided to patient.     Joanne Chars, LPN  [id number]

## 2018-11-06 ENCOUNTER — Ambulatory Visit (INDEPENDENT_AMBULATORY_CARE_PROVIDER_SITE_OTHER): Payer: PPO | Admitting: *Deleted

## 2018-11-06 VITALS — Ht 70.0 in | Wt 200.0 lb

## 2018-11-06 DIAGNOSIS — Z0000 Encounter for general adult medical examination without abnormal findings: Secondary | ICD-10-CM

## 2018-11-06 NOTE — Patient Instructions (Signed)
Please schedule your next medicare wellness visit with me in 1 yr.  John Jefferson , Thank you for taking time to come for your Medicare Wellness Visit. I appreciate your ongoing commitment to your health goals. Please review the following plan we discussed and let me know if I can assist you in the future.  Continue doing brain stimulating activities (puzzles, reading, adult coloring books, staying active) to keep memory sharp.   These are the goals we discussed: Goals    . Patient Stated     Patient stated wants this COVID gone and be able to get out more and get back into the gym

## 2018-12-06 ENCOUNTER — Other Ambulatory Visit: Payer: Self-pay | Admitting: Physician Assistant

## 2018-12-06 ENCOUNTER — Other Ambulatory Visit: Payer: Self-pay | Admitting: Family Medicine

## 2018-12-06 DIAGNOSIS — G894 Chronic pain syndrome: Secondary | ICD-10-CM | POA: Diagnosis not present

## 2018-12-06 DIAGNOSIS — M5136 Other intervertebral disc degeneration, lumbar region: Secondary | ICD-10-CM | POA: Diagnosis not present

## 2018-12-06 DIAGNOSIS — M542 Cervicalgia: Secondary | ICD-10-CM | POA: Diagnosis not present

## 2018-12-12 ENCOUNTER — Telehealth: Payer: Self-pay | Admitting: Family Medicine

## 2018-12-12 MED ORDER — DULOXETINE HCL 60 MG PO CPEP: 60.0000 mg | ORAL_CAPSULE | Freq: Every day | ORAL | 0 refills | Status: DC

## 2018-12-12 MED ORDER — DULOXETINE HCL 60 MG PO CPEP: 60.0000 mg | ORAL_CAPSULE | Freq: Every day | ORAL | 3 refills | Status: DC

## 2018-12-12 NOTE — Telephone Encounter (Signed)
Pt advised.

## 2018-12-12 NOTE — Telephone Encounter (Signed)
Pt called and requested his Cymbalta to be sent to [facility].Marland Kitchen

## 2018-12-12 NOTE — Telephone Encounter (Signed)
Cymbalta refilled.

## 2019-01-03 DIAGNOSIS — Z79899 Other long term (current) drug therapy: Secondary | ICD-10-CM | POA: Diagnosis not present

## 2019-01-03 DIAGNOSIS — Z79891 Long term (current) use of opiate analgesic: Secondary | ICD-10-CM | POA: Diagnosis not present

## 2019-01-03 DIAGNOSIS — G894 Chronic pain syndrome: Secondary | ICD-10-CM | POA: Diagnosis not present

## 2019-01-03 DIAGNOSIS — M542 Cervicalgia: Secondary | ICD-10-CM | POA: Diagnosis not present

## 2019-01-03 DIAGNOSIS — M5136 Other intervertebral disc degeneration, lumbar region: Secondary | ICD-10-CM | POA: Diagnosis not present

## 2019-01-30 DIAGNOSIS — G894 Chronic pain syndrome: Secondary | ICD-10-CM | POA: Diagnosis not present

## 2019-01-30 DIAGNOSIS — M5136 Other intervertebral disc degeneration, lumbar region: Secondary | ICD-10-CM | POA: Diagnosis not present

## 2019-01-30 DIAGNOSIS — M542 Cervicalgia: Secondary | ICD-10-CM | POA: Diagnosis not present

## 2019-02-27 DIAGNOSIS — M542 Cervicalgia: Secondary | ICD-10-CM | POA: Diagnosis not present

## 2019-02-27 DIAGNOSIS — M7062 Trochanteric bursitis, left hip: Secondary | ICD-10-CM | POA: Diagnosis not present

## 2019-02-27 DIAGNOSIS — G894 Chronic pain syndrome: Secondary | ICD-10-CM | POA: Diagnosis not present

## 2019-02-27 DIAGNOSIS — M5136 Other intervertebral disc degeneration, lumbar region: Secondary | ICD-10-CM | POA: Diagnosis not present

## 2019-03-12 NOTE — Progress Notes (Deleted)
Vela Prose, MD Reason for referral-coronary artery disease  HPI: 63 year old male for evaluation of coronary artery disease at request of Lynne Leader, MD.  Patient seen previously but not since July 2017.  He is status post coronary artery bypass graft in 2015 in [place].  At that time he had a LIMA to the LAD, RIMA to the obtuse marginal and saphenous vein graft to the posterior lateral.  Current Outpatient Medications  Medication Sig Dispense Refill  . allopurinol (ZYLOPRIM) 300 MG tablet Take 1 tablet by mouth daily 90 tablet 0  . aspirin 81 MG tablet Take 1 tablet (81 mg total) by mouth daily. 90 tablet 0  . atorvastatin (LIPITOR) 80 MG tablet Take 1 tablet by mouth daily 90 tablet 0  . clopidogrel (PLAVIX) 75 MG tablet TAKE 1 TABLET BY MOUTH  DAILY 90 tablet 3  . DULoxetine (CYMBALTA) 60 MG capsule Take 1 capsule (60 mg total) by mouth daily. 90 capsule 3  . fentaNYL (DURAGESIC - DOSED MCG/HR) 25 MCG/HR patch UNW AND APP 1 PA TO SKIN Q 72 H. REMOVE OLD PATCHES B ADDING NEW PA  0  . gabapentin (NEURONTIN) 300 MG capsule Take 2 capsules by mouth 3 times a day 540 capsule 0  . HYDROcodone-acetaminophen (NORCO/VICODIN) 5-325 MG tablet Take 1 tablet by mouth every 6 (six) hours as needed. 30 tablet 0  . levothyroxine (SYNTHROID) 75 MCG tablet Take 1 tablet by mouth daily before breakfast 90 tablet 0  . pantoprazole (PROTONIX) 40 MG tablet Take 1 tablet by mouth daily 90 tablet 0  . topiramate (TOPAMAX) 50 MG tablet      No current facility-administered medications for this visit.     No Known Allergies  Past Medical History:  Diagnosis Date  . Anxiety   . CAD (coronary artery disease)   . Cervical strain 11/21/2014  . Cervicogenic headache 11/21/2014  . Depression   . Fibromyalgia   . GERD (gastroesophageal reflux disease)   . Gout 07/09/2014  . Hyperlipidemia   . Hypothyroidism 07/09/2014  . Memory difficulty 02/17/2015  . S/P CABG x 3 07/09/2014    Past Surgical  History:  Procedure Laterality Date  . BACK SURGERY    . CATARACT EXTRACTION Bilateral   . CERVICAL FUSION    . CORONARY ARTERY BYPASS GRAFT  March 2015   Performed in [place]  . KNEE SURGERY    . ROTATOR CUFF REPAIR      Social History   Socioeconomic History  . Marital status: Married    Spouse name: Anderson Malta  . Number of children: 4  . Years of education: Master's  . Highest education level: Master's degree (e.g., MA, MS, MEng, MEd, MSW, MBA)  Occupational History  . 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 Financial resource strain: Not hard at all  . Food insecurity    Worry: Never true    Inability: Never true  . Transportation needs    Medical: No    Non-medical: No  Tobacco Use  . Smoking status: Former Smoker    Quit date: 04/12/1985    Years since quitting: 33.9  . Smokeless tobacco: Never Used  Substance and Sexual Activity  . Alcohol use: No    Alcohol/week: 0.0 standard drinks  . Drug use: No  . Sexual activity: Not Currently    Partners: Female  Lifestyle  . Physical activity    Days per week: 2 days    Minutes per  session: 40 min  . Stress: Not at all  Relationships  . Social Herbalist on phone: Once a week    Gets together: Twice a week    Attends religious service: More than 4 times per year    Active member of club or organization: No    Attends meetings of clubs or organizations: Never    Relationship status: Married  . Intimate partner violence    Fear of current or ex partner: No    Emotionally abused: No    Physically abused: No    Forced sexual activity: No  Other Topics Concern  . Not on file  Social History Narrative   Patient drinks 1 cup of caffeine daily.   Patient is right handed.    Family History  Problem Relation Age of Onset  . Heart attack Father   . Stroke Mother   . Thyroid disease Mother   . Breast cancer Sister   . Thyroid disease Brother   . Thyroid disease Sister   . Thyroid  disease Sister     ROS: no fevers or chills, productive cough, hemoptysis, dysphasia, odynophagia, melena, hematochezia, dysuria, hematuria, rash, seizure activity, orthopnea, PND, pedal edema, claudication. Remaining systems are negative.  Physical Exam:   There were no vitals taken for this visit.  General:  Well developed/well nourished in NAD Skin warm/dry Patient not depressed No peripheral clubbing Back-normal HEENT-normal/normal eyelids Neck supple/normal carotid upstroke bilaterally; no bruits; no JVD; no thyromegaly chest - CTA/ normal expansion CV - RRR/normal S1 and S2; no murmurs, rubs or gallops;  PMI nondisplaced Abdomen -NT/ND, no HSM, no mass, + bowel sounds, no bruit 2+ femoral pulses, no bruits Ext-no edema, chords, 2+ DP Neuro-grossly nonfocal  ECG - personally reviewed  A/P  1 coronary artery disease status post coronary artery bypass and graft-patient denies chest pain.  Plan to continue medical therapy with aspirin and statin.  2 hyperlipidemia-continue statin.  Check lipids and liver.  Kirk Ruths, MD

## 2019-03-14 ENCOUNTER — Telehealth: Payer: Self-pay | Admitting: Physician Assistant

## 2019-03-14 NOTE — Telephone Encounter (Signed)
Patient called and is having some headaches and dizziness on an off. He also reported a fever on and off. He has been taking Ibuprofen to help keep the fever down and it helps some. He was advised of symptoms that get worsening he should go the ER or UC to get evaluated. He did not have any questions. He is going to get covid testing as well at [facility] or [facility].

## 2019-03-15 DIAGNOSIS — Z03818 Encounter for observation for suspected exposure to other biological agents ruled out: Secondary | ICD-10-CM | POA: Diagnosis not present

## 2019-03-16 ENCOUNTER — Ambulatory Visit (INDEPENDENT_AMBULATORY_CARE_PROVIDER_SITE_OTHER): Payer: PPO | Admitting: Physician Assistant

## 2019-03-16 VITALS — Temp 100.3°F | Ht 70.0 in | Wt 200.0 lb

## 2019-03-16 DIAGNOSIS — R519 Headache, unspecified: Secondary | ICD-10-CM | POA: Diagnosis not present

## 2019-03-16 DIAGNOSIS — R42 Dizziness and giddiness: Secondary | ICD-10-CM

## 2019-03-16 DIAGNOSIS — R110 Nausea: Secondary | ICD-10-CM | POA: Diagnosis not present

## 2019-03-16 MED ORDER — PROMETHAZINE HCL 25 MG PO TABS: 25.0000 mg | ORAL_TABLET | Freq: Four times a day (QID) | ORAL | 0 refills | Status: DC | PRN

## 2019-03-16 MED ORDER — AMOXICILLIN-POT CLAVULANATE 875-125 MG PO TABS: 1.0000 | ORAL_TABLET | Freq: Two times a day (BID) | ORAL | 0 refills | Status: DC

## 2019-03-16 MED ORDER — METHYLPREDNISOLONE 4 MG PO TBPK: ORAL_TABLET | ORAL | 0 refills | Status: DC

## 2019-03-16 NOTE — Progress Notes (Signed)
Patient ID: John Jefferson., male   DOB: [birth year], 63 y.o.   MRN: [id number] .Marland KitchenVirtual Visit via Video Note  I connected with John Jefferson. on 03/16/19 at  2:40 PM EST by a video enabled telemedicine application and verified that I am speaking with the correct person using two identifiers.  Location: Patient: home Provider: clinic   I discussed the limitations of evaluation and management by telemedicine and the availability of in person appointments. The patient expressed understanding and agreed to proceed.  History of Present Illness: Pt is a 63 yo male with CAD, hypothyroidism, chronic pain who calls into the clinic with 1.5 weeks of headaches and dizziness. He has also reported fever intermittently as well. No SOB or cough. No GI symptoms. Dizziness is worse with movement. Headaches hurt at top of head and at times he feels like he cannot even move. No vomiting. He is nauseated at times. He has had some light and sound sensitivity. Not tried any OTC medications.no med changes.  No vision changes, speech slurred, numbness or tingling.   Pt denies any sick contacts or covid exposure.   .. Active Ambulatory Problems    Diagnosis Date Noted  . S/P CABG x 3 07/09/2014  . Hypothyroidism 07/09/2014  . Hyperlipidemia 07/09/2014  . GERD (gastroesophageal reflux disease) 07/09/2014  . Gout 07/09/2014  . Lumbar facet joint pain 07/25/2014  . CAD (coronary artery disease) 07/25/2014  . Vitamin D deficiency 07/25/2014  . Degenerative disc disease, cervical 07/25/2014  . Memory difficulty 02/17/2015  . Barrett's esophagus 08/26/2015  . Retinal detachment 02/19/2016  . Chronic neck pain 07/20/2016   Resolved Ambulatory Problems    Diagnosis Date Noted  . Post concussion syndrome 11/07/2014  . Cervical strain 11/21/2014  . Cervicogenic headache 11/21/2014   Past Medical History:  Diagnosis Date  . Anxiety   . Depression   . Fibromyalgia    Reviewed med, allergy, problem  list.     Observations No acute distress. Appears fatigued. No cough, SOB, wheezing.   .. Today's Vitals   03/16/19 0918  Temp: 100.3 F (37.9 C)  TempSrc: Oral  Weight: 200 lb (90.7 kg)  Height: 5\' 10"  (1.778 m)   Body mass index is 28.7 kg/m.      Assessment and Plan: Marland KitchenMarland KitchenSanchez was seen today for headache, fever and dizziness.  Diagnoses and all orders for this visit:  Acute intractable headache, unspecified headache type -     amoxicillin-clavulanate (AUGMENTIN) 875-125 MG tablet; Take 1 tablet by mouth 2 (two) times daily. -     methylPREDNISolone (MEDROL DOSEPAK) 4 MG TBPK tablet; Take as directed by package insert.  Dizziness -     amoxicillin-clavulanate (AUGMENTIN) 875-125 MG tablet; Take 1 tablet by mouth 2 (two) times daily. -     methylPREDNISolone (MEDROL DOSEPAK) 4 MG TBPK tablet; Take as directed by package insert.  Nausea -     promethazine (PHENERGAN) 25 MG tablet; Take 1 tablet (25 mg total) by mouth every 6 (six) hours as needed for nausea or vomiting.   No hx of migraines. No red flag neuro symptoms. Need covid testing. Treated for atypical sinusitis with possiblity of causing a status migraine with augmentin and medrol dose pack. If worsening will get imaging. Phenergan as needed. Discussed epley manuevers. Self isolate until covid testing returns.   Follow Up Instructions:    I discussed the assessment and treatment plan with the patient. The patient was provided an opportunity to ask questions  and all were answered. The patient agreed with the plan and demonstrated an understanding of the instructions.   The patient was advised to call back or seek an in-person evaluation if the symptoms worsen or if the condition fails to improve as anticipated.    Iran Planas, PA-C

## 2019-03-16 NOTE — Progress Notes (Deleted)
Started a week and a half ago: Headaches - painful "to the point I can't even move" at top of head Nausea/ no vomiting/ sensitivity to light and sound Dizziness - every day, has been all the time for last 1.5 week, movement makes worse Fever - comes and goes  Has not taking any OTC medication for treatment No recent medication changes

## 2019-03-19 ENCOUNTER — Ambulatory Visit: Payer: PPO | Admitting: Cardiology

## 2019-03-19 ENCOUNTER — Encounter: Payer: Self-pay | Admitting: Physician Assistant

## 2019-04-10 DIAGNOSIS — G894 Chronic pain syndrome: Secondary | ICD-10-CM | POA: Diagnosis not present

## 2019-04-10 DIAGNOSIS — M5136 Other intervertebral disc degeneration, lumbar region: Secondary | ICD-10-CM | POA: Diagnosis not present

## 2019-04-10 DIAGNOSIS — M542 Cervicalgia: Secondary | ICD-10-CM | POA: Diagnosis not present

## 2019-04-16 ENCOUNTER — Telehealth: Payer: Self-pay | Admitting: Family Medicine

## 2019-04-16 MED ORDER — CLOPIDOGREL BISULFATE 75 MG PO TABS: 75.0000 mg | ORAL_TABLET | Freq: Every day | ORAL | 0 refills | Status: DC

## 2019-04-16 MED ORDER — ATORVASTATIN CALCIUM 80 MG PO TABS: 80.0000 mg | ORAL_TABLET | Freq: Every day | ORAL | 0 refills | Status: DC

## 2019-04-16 MED ORDER — PANTOPRAZOLE SODIUM 40 MG PO TBEC: 40.0000 mg | DELAYED_RELEASE_TABLET | Freq: Every day | ORAL | 0 refills | Status: DC

## 2019-04-16 MED ORDER — GABAPENTIN 300 MG PO CAPS: 600.0000 mg | ORAL_CAPSULE | Freq: Three times a day (TID) | ORAL | 0 refills | Status: DC

## 2019-04-16 MED ORDER — LEVOTHYROXINE SODIUM 75 MCG PO TABS: ORAL_TABLET | ORAL | 0 refills | Status: DC

## 2019-04-16 MED ORDER — ALLOPURINOL 300 MG PO TABS: 300.0000 mg | ORAL_TABLET | Freq: Every day | ORAL | 0 refills | Status: DC

## 2019-04-16 NOTE — Telephone Encounter (Signed)
Pt called. He needs a refill on all meds except his Fluoxetine. He has an appointment with Dr. Zigmund Daniel on Feb1.  Thank you.

## 2019-04-16 NOTE — Telephone Encounter (Signed)
30 day supplies of medication sent to [facility].

## 2019-04-16 NOTE — Telephone Encounter (Signed)
Ok for refills to get him to appt with Dr. Zigmund Daniel.

## 2019-04-17 NOTE — Progress Notes (Signed)
John Prose MD Reason for referral-CAD  HPI: 64 year old male for evaluation of coronary artery disease at request of Lynne Leader, MD.  Patient seen previously but not since July 2017. Patient is status post coronary artery bypass graft in 2015 in [place] (LIMA to LAD, RIMA to OM and SVG to RPL).  Patient had Covid in November and has residual fatigue.  Minimal dyspnea on exertion.  No orthopnea, PND, pedal edema, exertional chest pain or syncope.  Current Outpatient Medications  Medication Sig Dispense Refill  . allopurinol (ZYLOPRIM) 300 MG tablet Take 1 tablet (300 mg total) by mouth daily. 30 tablet 0  . aspirin 81 MG tablet Take 1 tablet (81 mg total) by mouth daily. 90 tablet 0  . atorvastatin (LIPITOR) 80 MG tablet Take 1 tablet (80 mg total) by mouth daily. 30 tablet 0  . clopidogrel (PLAVIX) 75 MG tablet Take 1 tablet (75 mg total) by mouth daily. 30 tablet 0  . DULoxetine (CYMBALTA) 60 MG capsule Take 1 capsule (60 mg total) by mouth daily. 90 capsule 3  . fentaNYL (DURAGESIC - DOSED MCG/HR) 25 MCG/HR patch UNW AND APP 1 PA TO SKIN Q 72 H. REMOVE OLD PATCHES B ADDING NEW PA  0  . gabapentin (NEURONTIN) 300 MG capsule Take 2 capsules (600 mg total) by mouth 3 (three) times daily. 180 capsule 0  . HYDROcodone-acetaminophen (NORCO/VICODIN) 5-325 MG tablet Take 1 tablet by mouth every 6 (six) hours as needed. 30 tablet 0  . levothyroxine (SYNTHROID) 75 MCG tablet Take 1 tablet by mouth daily before breakfast 30 tablet 0  . pantoprazole (PROTONIX) 40 MG tablet Take 1 tablet (40 mg total) by mouth daily. 30 tablet 0  . topiramate (TOPAMAX) 50 MG tablet      No current facility-administered medications for this visit.    No Known Allergies   Past Medical History:  Diagnosis Date  . Anxiety   . CAD (coronary artery disease)   . Cervical strain 11/21/2014  . Cervicogenic headache 11/21/2014  . Depression   . Fibromyalgia   . GERD (gastroesophageal reflux disease)   .  Gout 07/09/2014  . Hyperlipidemia   . Hypothyroidism 07/09/2014  . Memory difficulty 02/17/2015  . S/P CABG x 3 07/09/2014    Past Surgical History:  Procedure Laterality Date  . BACK SURGERY    . CATARACT EXTRACTION Bilateral   . CERVICAL FUSION    . CORONARY ARTERY BYPASS GRAFT  March 2015   Performed in [place]  . KNEE SURGERY    . ROTATOR CUFF REPAIR      Social History   Socioeconomic History  . Marital status: Married    Spouse name: Anderson Malta  . Number of children: 4  . Years of education: Master's  . Highest education level: Master's degree (e.g., MA, MS, MEng, MEd, MSW, MBA)  Occupational History  . 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 Smoking status: Former Smoker    Quit date: 04/12/1985    Years since quitting: 34.0  . Smokeless tobacco: Never Used  Substance and Sexual Activity  . Alcohol use: No    Alcohol/week: 0.0 standard drinks  . Drug use: No  . Sexual activity: Not Currently    Partners: Female  Other Topics Concern  . Not on file  Social History Narrative   Patient drinks 1 cup of caffeine daily.   Patient is right handed.   Social Determinants of Radio broadcast assistant  Strain: Low Risk   . Difficulty of Paying Living Expenses: Not hard at all  Food Insecurity: No Food Insecurity  . Worried About Charity fundraiser in the Last Year: Never true  . Ran Out of Food in the Last Year: Never true  Transportation Needs: No Transportation Needs  . Lack of Transportation (Medical): No  . Lack of Transportation (Non-Medical): No  Physical Activity: Insufficiently Active  . Days of Exercise per Week: 2 days  . Minutes of Exercise per Session: 40 min  Stress: No Stress Concern Present  . Feeling of Stress : Not at all  Social Connections: Slightly Isolated  . Frequency of Communication with Friends and Family: Once a week  . Frequency of Social Gatherings with Friends and Family: Twice a week  . Attends Religious Services:  More than 4 times per year  . Active Member of Clubs or Organizations: No  . Attends Archivist Meetings: Never  . Marital Status: Married  Human resources officer Violence: Not At Risk  . Fear of Current or Ex-Partner: No  . Emotionally Abused: No  . Physically Abused: No  . Sexually Abused: No    Family History  Problem Relation Age of Onset  . Heart attack Father   . Stroke Mother   . Thyroid disease Mother   . Breast cancer Sister   . Thyroid disease Brother   . Thyroid disease Sister   . Thyroid disease Sister     ROS: Some residual fatigue from recent Covid infection but no fevers or chills, productive cough, hemoptysis, dysphasia, odynophagia, melena, hematochezia, dysuria, hematuria, rash, seizure activity, orthopnea, PND, pedal edema, claudication. Remaining systems are negative.  Physical Exam:   Blood pressure 118/76, pulse 91, height 5\' 10"  (1.778 m), weight 202 lb 12.8 oz (92 kg), SpO2 94 %.  General:  Well developed/well nourished in NAD Skin warm/dry Patient not depressed No peripheral clubbing Back-normal HEENT-normal/normal eyelids Neck supple/normal carotid upstroke bilaterally; no bruits; no JVD; no thyromegaly chest - CTA/ normal expansion CV - RRR/normal S1 and S2; no murmurs, rubs or gallops;  PMI nondisplaced Abdomen -NT/ND, no HSM, no mass, + bowel sounds, no bruit 2+ femoral pulses, no bruits Ext-no edema, chords, 2+ DP Neuro-grossly nonfocal  ECG -sinus rhythm at a rate of 91, nonspecific ST changes.  Personally reviewed  A/P  1 coronary artery disease status post coronary artery bypass graft-patient denies chest pain.  Continue medical therapy with aspirin and statin.  We will arrange echocardiogram to quantify LV function.  2 hyperlipidemia-continue statin.  3 bruit-schedule abdominal ultrasound to exclude aneurysm.  Kirk Ruths, MD

## 2019-04-23 ENCOUNTER — Ambulatory Visit: Payer: Medicare Other | Admitting: Cardiology

## 2019-04-23 ENCOUNTER — Other Ambulatory Visit: Payer: Self-pay

## 2019-04-23 ENCOUNTER — Encounter: Payer: Self-pay | Admitting: Cardiology

## 2019-04-23 VITALS — BP 118/76 | HR 91 | Ht 70.0 in | Wt 202.8 lb

## 2019-04-23 DIAGNOSIS — I2510 Atherosclerotic heart disease of native coronary artery without angina pectoris: Secondary | ICD-10-CM | POA: Diagnosis not present

## 2019-04-23 DIAGNOSIS — E7800 Pure hypercholesterolemia, unspecified: Secondary | ICD-10-CM

## 2019-04-23 DIAGNOSIS — R0989 Other specified symptoms and signs involving the circulatory and respiratory systems: Secondary | ICD-10-CM | POA: Diagnosis not present

## 2019-04-23 NOTE — Patient Instructions (Signed)
Medication Instructions:  NO CHANGE *If you need a refill on your cardiac medications before your next appointment, please call your pharmacy*  Lab Work: If you have labs (blood work) drawn today and your tests are completely normal, you will receive your results only by: Marland Kitchen MyChart Message (if you have MyChart) OR . A paper copy in the mail If you have any lab test that is abnormal or we need to change your treatment, we will call you to review the results.  Testing/Procedures: Your physician has requested that you have an echocardiogram. Echocardiography is a painless test that uses sound waves to create images of your heart. It provides your doctor with information about the size and shape of your heart and how well your heart's chambers and valves are working. This procedure takes approximately one hour. There are no restrictions for this procedure.[facility]  Your physician has requested that you have an abdominal aorta duplex. During this test, an ultrasound is used to evaluate the aorta. Allow 30 minutes for this exam. Do not eat after midnight the day before and avoid carbonated beverages [facility]    Follow-Up: At [facility], you and your health needs are our priority.  As part of our continuing mission to provide you with exceptional heart care, we have created designated Provider Care Teams.  These Care Teams include your primary Cardiologist (physician) and Advanced Practice Providers (APPs -  Physician Assistants and Nurse Practitioners) who all work together to provide you with the care you need, when you need it.  Your next appointment:   12 month(s)  The format for your next appointment:   Either In Person or Virtual  Provider:   Kirk Ruths, MD [place]

## 2019-04-24 ENCOUNTER — Ambulatory Visit (HOSPITAL_BASED_OUTPATIENT_CLINIC_OR_DEPARTMENT_OTHER)
Admission: RE | Admit: 2019-04-24 | Discharge: 2019-04-24 | Disposition: A | Discharge status: Home / Self Care | Payer: Medicare Other | Source: Ambulatory Visit | Attending: Cardiology | Admitting: Cardiology

## 2019-04-24 DIAGNOSIS — I2510 Atherosclerotic heart disease of native coronary artery without angina pectoris: Secondary | ICD-10-CM | POA: Insufficient documentation

## 2019-04-24 NOTE — Progress Notes (Signed)
  Echocardiogram 2D Echocardiogram has been performed.   Cardell Peach 04/24/2019, 2:51 PM

## 2019-04-26 ENCOUNTER — Ambulatory Visit (HOSPITAL_COMMUNITY)
Admission: RE | Admit: 2019-04-26 | Discharge: 2019-04-26 | Disposition: A | Discharge status: Home / Self Care | Payer: Medicare Other | Source: Ambulatory Visit | Attending: Cardiology | Admitting: Cardiology

## 2019-04-26 ENCOUNTER — Other Ambulatory Visit: Payer: Self-pay

## 2019-04-26 DIAGNOSIS — Z136 Encounter for screening for cardiovascular disorders: Secondary | ICD-10-CM | POA: Diagnosis not present

## 2019-04-26 DIAGNOSIS — E785 Hyperlipidemia, unspecified: Secondary | ICD-10-CM | POA: Diagnosis not present

## 2019-04-26 DIAGNOSIS — I2510 Atherosclerotic heart disease of native coronary artery without angina pectoris: Secondary | ICD-10-CM | POA: Insufficient documentation

## 2019-04-26 DIAGNOSIS — Z87891 Personal history of nicotine dependence: Secondary | ICD-10-CM | POA: Diagnosis not present

## 2019-04-26 DIAGNOSIS — R0989 Other specified symptoms and signs involving the circulatory and respiratory systems: Secondary | ICD-10-CM | POA: Insufficient documentation

## 2019-05-14 ENCOUNTER — Other Ambulatory Visit: Payer: Self-pay

## 2019-05-14 ENCOUNTER — Encounter: Payer: Self-pay | Admitting: Family Medicine

## 2019-05-14 ENCOUNTER — Ambulatory Visit (INDEPENDENT_AMBULATORY_CARE_PROVIDER_SITE_OTHER): Payer: Medicare Other | Admitting: Family Medicine

## 2019-05-14 VITALS — BP 132/87 | HR 70 | Temp 97.8°F | Wt 200.0 lb

## 2019-05-14 DIAGNOSIS — E039 Hypothyroidism, unspecified: Secondary | ICD-10-CM

## 2019-05-14 DIAGNOSIS — R413 Other amnesia: Secondary | ICD-10-CM

## 2019-05-14 DIAGNOSIS — G4486 Cervicogenic headache: Secondary | ICD-10-CM

## 2019-05-14 DIAGNOSIS — E7800 Pure hypercholesterolemia, unspecified: Secondary | ICD-10-CM

## 2019-05-14 DIAGNOSIS — R519 Headache, unspecified: Secondary | ICD-10-CM

## 2019-05-14 DIAGNOSIS — Z125 Encounter for screening for malignant neoplasm of prostate: Secondary | ICD-10-CM

## 2019-05-14 DIAGNOSIS — I2510 Atherosclerotic heart disease of native coronary artery without angina pectoris: Secondary | ICD-10-CM | POA: Diagnosis not present

## 2019-05-14 DIAGNOSIS — M5030 Other cervical disc degeneration, unspecified cervical region: Secondary | ICD-10-CM

## 2019-05-14 DIAGNOSIS — M1A9XX0 Chronic gout, unspecified, without tophus (tophi): Secondary | ICD-10-CM

## 2019-05-14 MED ORDER — CLOPIDOGREL BISULFATE 75 MG PO TABS: 75.0000 mg | ORAL_TABLET | Freq: Every day | ORAL | 2 refills | Status: DC

## 2019-05-14 MED ORDER — DULOXETINE HCL 60 MG PO CPEP: 60.0000 mg | ORAL_CAPSULE | Freq: Every day | ORAL | 3 refills | Status: DC

## 2019-05-14 MED ORDER — ALLOPURINOL 300 MG PO TABS: 300.0000 mg | ORAL_TABLET | Freq: Every day | ORAL | 3 refills | Status: DC

## 2019-05-14 MED ORDER — GABAPENTIN 300 MG PO CAPS: 600.0000 mg | ORAL_CAPSULE | Freq: Three times a day (TID) | ORAL | 3 refills | Status: DC

## 2019-05-14 MED ORDER — ATORVASTATIN CALCIUM 80 MG PO TABS: 80.0000 mg | ORAL_TABLET | Freq: Every day | ORAL | 3 refills | Status: DC

## 2019-05-14 MED ORDER — PANTOPRAZOLE SODIUM 40 MG PO TBEC: 40.0000 mg | DELAYED_RELEASE_TABLET | Freq: Every day | ORAL | 3 refills | Status: DC

## 2019-05-14 MED ORDER — LEVOTHYROXINE SODIUM 75 MCG PO TABS: ORAL_TABLET | ORAL | 3 refills | Status: DC

## 2019-05-14 NOTE — Assessment & Plan Note (Signed)
Denies recent flares, continue allopurinol.

## 2019-05-14 NOTE — Patient Instructions (Signed)
It was great to meet you! Continue current medications and follow up in 6 months.

## 2019-05-14 NOTE — Assessment & Plan Note (Signed)
He is requesting B12 shot today.  Update levels.

## 2019-05-14 NOTE — Assessment & Plan Note (Signed)
Stable, denies anginal symptoms.  Continue dual antiplatelet regimen and continued f/u with cardiology.  Update lipids and CBC today.

## 2019-05-14 NOTE — Assessment & Plan Note (Signed)
Recurrent issue, worse since COVID in December.  Reports pain mgmt recently adjusted topiramate.

## 2019-05-14 NOTE — Assessment & Plan Note (Signed)
Followed by pain management. Currently treated with chronic opioids.

## 2019-05-14 NOTE — Progress Notes (Signed)
John Jefferson. - 64 y.o. male MRN [id number]  Date of birth: [birth year]  Subjective Chief Complaint  Patient presents with  . Medication Management    HPI John Jefferson. is a 64 y.o. male with history of hypothyroidism, HLD, chronic neck pain, CAD s/p CABG, anxiety and gout here today for routine interval follow up visit.   Since last visit he reports dx of COVID-19 in December of last year.  He has had fatigue and increased headache since diagnosis.    -Hypothyroidism:  Reports compliance with levothyroxine.  Takes first thing in the morning.  He has had some increased fatigue but no other symptoms related to hypo/hyper-thyroidism  -Anxiety:  Current management with duloxetine.  He reports he is doing well with this and anxiety is under good control.  No side effects related to cymbalta at this time.   -CAD/HLD:  S/p CABG x4 in 2015.  He continue to follow with cardiology.  He is compliant with atorvastatin and antiplatelet regimen of plavix + 81mg  asa.  He denies anginal symptoms.   -Chronic neck pain:  See pain management and is on chronic opioid therapy for management of this.  He is currently treated with fentanyl patch and norco prn for breakthrough pain.  He denies side effects including increased constipation.  He does report increased headaches.  He is also on topiramate for migraine ppx.   ROS:  A comprehensive ROS was completed and negative except as noted per HPI    No Known Allergies  Past Medical History:  Diagnosis Date  . Anxiety   . CAD (coronary artery disease)   . Cervical strain 11/21/2014  . Cervicogenic headache 11/21/2014  . Depression   . Fibromyalgia   . GERD (gastroesophageal reflux disease)   . Gout 07/09/2014  . Hyperlipidemia   . Hypothyroidism 07/09/2014  . Memory difficulty 02/17/2015  . S/P CABG x 3 07/09/2014    Past Surgical History:  Procedure Laterality Date  . BACK SURGERY    . CATARACT EXTRACTION Bilateral   . CERVICAL FUSION    .  CORONARY ARTERY BYPASS GRAFT  March 2015   Performed in [place]  . KNEE SURGERY    . ROTATOR CUFF REPAIR      Social History   Socioeconomic History  . Marital status: Married    Spouse name: Anderson Malta  . Number of children: 4  . Years of education: Master's  . Highest education level: Master's degree (e.g., MA, MS, MEng, MEd, MSW, MBA)  Occupational History  . 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 Smoking status: Former Smoker    Quit date: 04/12/1985    Years since quitting: 34.1  . Smokeless tobacco: Never Used  Substance and Sexual Activity  . Alcohol use: No    Alcohol/week: 0.0 standard drinks  . Drug use: No  . Sexual activity: Not Currently    Partners: Female  Other Topics Concern  . Not on file  Social History Narrative   Patient drinks 1 cup of caffeine daily.   Patient is right handed.   Social Determinants of Health   Financial Resource Strain: Low Risk   . Difficulty of Paying Living Expenses: Not hard at all  Food Insecurity: No Food Insecurity  . Worried About Charity fundraiser in the Last Year: Never true  . Ran Out of Food in the Last Year: Never true  Transportation Needs: No Transportation Needs  . Lack of  Transportation (Medical): No  . Lack of Transportation (Non-Medical): No  Physical Activity: Insufficiently Active  . Days of Exercise per Week: 2 days  . Minutes of Exercise per Session: 40 min  Stress: No Stress Concern Present  . Feeling of Stress : Not at all  Social Connections: Slightly Isolated  . Frequency of Communication with Friends and Family: Once a week  . Frequency of Social Gatherings with Friends and Family: Twice a week  . Attends Religious Services: More than 4 times per year  . Active Member of Clubs or Organizations: No  . Attends Archivist Meetings: Never  . Marital Status: Married    Family History  Problem Relation Age of Onset  . Heart attack Father   . Stroke Mother   .  Thyroid disease Mother   . Breast cancer Sister   . Thyroid disease Brother   . Thyroid disease Sister   . Thyroid disease Sister     Health Maintenance  Topic Date Due  . PNA vac Low Risk Adult (1 of 2 - PCV13) 12/06/2018  . COLONOSCOPY  10/26/2020  . TETANUS/TDAP  03/17/2026  . INFLUENZA VACCINE  Completed  . Hepatitis C Screening  Completed  . HIV Screening  Completed    ----------------------------------------------------------------------------------------------------------------------------------------------------------------------------------------------------------------- Physical Exam BP 132/87   Pulse 70   Temp 97.8 F (36.6 C) (Oral)   Wt 200 lb (90.7 kg)   BMI 28.70 kg/m   Physical Exam Constitutional:      General: He is not in acute distress. HENT:     Head: Normocephalic and atraumatic.  Eyes:     General: No scleral icterus. Neck:     Thyroid: No thyromegaly.  Cardiovascular:     Rate and Rhythm: Normal rate and regular rhythm.     Heart sounds: Normal heart sounds.  Pulmonary:     Effort: Pulmonary effort is normal.     Breath sounds: Normal breath sounds.  Abdominal:     General: There is no distension.     Tenderness: There is no abdominal tenderness. There is no guarding.  Musculoskeletal:     Cervical back: Normal range of motion.  Lymphadenopathy:     Cervical: No cervical adenopathy.  Skin:    General: Skin is warm and dry.     Findings: No rash.  Neurological:     Mental Status: He is alert and oriented to person, place, and time.     Cranial Nerves: No cranial nerve deficit.     Motor: No abnormal muscle tone.  Psychiatric:        Mood and Affect: Mood normal.        Behavior: Behavior normal.     ------------------------------------------------------------------------------------------------------------------------------------------------------------------------------------------------------------------- Assessment and  Plan  CAD (coronary artery disease) Stable, denies anginal symptoms.  Continue dual antiplatelet regimen and continued f/u with cardiology.  Update lipids and CBC today.   Hypothyroidism Denies symptoms related to thyroid other than fatigue.  Update TSH  Degenerative disc disease, cervical Followed by pain management. Currently treated with chronic opioids.   Memory difficulty He is requesting B12 shot today.  Update levels.   Gout Denies recent flares, continue allopurinol.   Cervicogenic headache Recurrent issue, worse since COVID in December.  Reports pain mgmt recently adjusted topiramate.     This visit occurred during the SARS-CoV-2 public health emergency.  Safety protocols were in place, including screening questions prior to the visit, additional usage of staff PPE, and extensive cleaning of exam room while observing  appropriate contact time as indicated for disinfecting solutions.

## 2019-05-14 NOTE — Assessment & Plan Note (Signed)
Denies symptoms related to thyroid other than fatigue.  Update TSH

## 2019-05-15 ENCOUNTER — Other Ambulatory Visit: Payer: Self-pay

## 2019-05-15 DIAGNOSIS — R413 Other amnesia: Secondary | ICD-10-CM

## 2019-05-15 DIAGNOSIS — E7800 Pure hypercholesterolemia, unspecified: Secondary | ICD-10-CM | POA: Diagnosis not present

## 2019-05-15 DIAGNOSIS — Z125 Encounter for screening for malignant neoplasm of prostate: Secondary | ICD-10-CM

## 2019-05-15 DIAGNOSIS — E039 Hypothyroidism, unspecified: Secondary | ICD-10-CM | POA: Diagnosis not present

## 2019-05-16 DIAGNOSIS — G894 Chronic pain syndrome: Secondary | ICD-10-CM | POA: Diagnosis not present

## 2019-05-16 DIAGNOSIS — M5136 Other intervertebral disc degeneration, lumbar region: Secondary | ICD-10-CM | POA: Diagnosis not present

## 2019-05-16 DIAGNOSIS — M542 Cervicalgia: Secondary | ICD-10-CM | POA: Diagnosis not present

## 2019-05-16 DIAGNOSIS — M7062 Trochanteric bursitis, left hip: Secondary | ICD-10-CM | POA: Diagnosis not present

## 2019-05-16 LAB — COMPLETE METABOLIC PANEL WITH GFR
AG Ratio: 1.8 (calc) (ref 1.0–2.5)
ALT: 23 U/L (ref 9–46)
AST: 26 U/L (ref 10–35)
Albumin: 4.6 g/dL (ref 3.6–5.1)
Alkaline phosphatase (APISO): 81 U/L (ref 35–144)
BUN: 19 mg/dL (ref 7–25)
CO2: 24 mmol/L (ref 20–32)
Calcium: 9.9 mg/dL (ref 8.6–10.3)
Chloride: 110 mmol/L (ref 98–110)
Creat: 0.98 mg/dL (ref 0.70–1.25)
GFR, Est African American: 93 mL/min/{1.73_m2} (ref >=60)
GFR, Est Non African American: 81 mL/min/{1.73_m2} (ref >=60)
Globulin: 2.5 g/dL (calc) (ref 1.9–3.7)
Glucose, Bld: 96 mg/dL (ref 65–99)
Potassium: 4.8 mmol/L (ref 3.5–5.3)
Sodium: 142 mmol/L (ref 135–146)
Total Bilirubin: 0.4 mg/dL (ref 0.2–1.2)
Total Protein: 7.1 g/dL (ref 6.1–8.1)

## 2019-05-16 LAB — CBC
HCT: 42 % (ref 38.5–50.0)
Hemoglobin: 13.9 g/dL (ref 13.2–17.1)
MCH: 29.4 pg (ref 27.0–33.0)
MCHC: 33.1 g/dL (ref 32.0–36.0)
MCV: 89 fL (ref 80.0–100.0)
MPV: 10.8 fL (ref 7.5–12.5)
Platelets: 248 10*3/uL (ref 140–400)
RBC: 4.72 10*6/uL (ref 4.20–5.80)
RDW: 13.8 % (ref 11.0–15.0)
WBC: 8.1 10*3/uL (ref 3.8–10.8)

## 2019-05-16 LAB — LIPID PANEL
Cholesterol: 162 mg/dL (ref <200)
HDL: 46 mg/dL (ref >=40)
LDL Cholesterol (Calc): 89 mg/dL (calc)
Non-HDL Cholesterol (Calc): 116 mg/dL (calc) (ref <130)
Total CHOL/HDL Ratio: 3.5 (calc) (ref <5.0)
Triglycerides: 169 mg/dL — ABNORMAL HIGH (ref <150)

## 2019-05-16 LAB — VITAMIN B12: Vitamin B-12: 712 pg/mL (ref 200–1100)

## 2019-05-16 LAB — TSH: TSH: 2.39 mIU/L (ref 0.40–4.50)

## 2019-05-16 LAB — PSA: PSA: 0.4 ng/mL (ref <=4.0)

## 2019-06-18 DIAGNOSIS — G894 Chronic pain syndrome: Secondary | ICD-10-CM | POA: Diagnosis not present

## 2019-06-18 DIAGNOSIS — M4322 Fusion of spine, cervical region: Secondary | ICD-10-CM | POA: Diagnosis not present

## 2019-06-18 DIAGNOSIS — M542 Cervicalgia: Secondary | ICD-10-CM | POA: Diagnosis not present

## 2019-06-18 DIAGNOSIS — M47892 Other spondylosis, cervical region: Secondary | ICD-10-CM | POA: Diagnosis not present

## 2019-06-18 DIAGNOSIS — M5136 Other intervertebral disc degeneration, lumbar region: Secondary | ICD-10-CM | POA: Diagnosis not present

## 2019-06-26 DIAGNOSIS — M47892 Other spondylosis, cervical region: Secondary | ICD-10-CM | POA: Diagnosis not present

## 2019-06-26 DIAGNOSIS — M47812 Spondylosis without myelopathy or radiculopathy, cervical region: Secondary | ICD-10-CM | POA: Diagnosis not present

## 2019-06-26 DIAGNOSIS — M2578 Osteophyte, vertebrae: Secondary | ICD-10-CM | POA: Diagnosis not present

## 2019-06-26 DIAGNOSIS — M4312 Spondylolisthesis, cervical region: Secondary | ICD-10-CM | POA: Diagnosis not present

## 2019-06-26 DIAGNOSIS — M5033 Other cervical disc degeneration, cervicothoracic region: Secondary | ICD-10-CM | POA: Diagnosis not present

## 2019-07-09 DIAGNOSIS — M47812 Spondylosis without myelopathy or radiculopathy, cervical region: Secondary | ICD-10-CM | POA: Diagnosis not present

## 2019-07-23 DIAGNOSIS — M5136 Other intervertebral disc degeneration, lumbar region: Secondary | ICD-10-CM | POA: Diagnosis not present

## 2019-07-23 DIAGNOSIS — M47812 Spondylosis without myelopathy or radiculopathy, cervical region: Secondary | ICD-10-CM | POA: Diagnosis not present

## 2019-07-23 DIAGNOSIS — M542 Cervicalgia: Secondary | ICD-10-CM | POA: Diagnosis not present

## 2019-07-23 DIAGNOSIS — M4322 Fusion of spine, cervical region: Secondary | ICD-10-CM | POA: Diagnosis not present

## 2019-08-06 ENCOUNTER — Institutional Professional Consult (permissible substitution): Payer: Medicare Other | Admitting: Neurology

## 2019-08-20 DIAGNOSIS — M7551 Bursitis of right shoulder: Secondary | ICD-10-CM | POA: Diagnosis not present

## 2019-08-20 DIAGNOSIS — M4322 Fusion of spine, cervical region: Secondary | ICD-10-CM | POA: Diagnosis not present

## 2019-08-20 DIAGNOSIS — Z79899 Other long term (current) drug therapy: Secondary | ICD-10-CM | POA: Diagnosis not present

## 2019-08-20 DIAGNOSIS — M47812 Spondylosis without myelopathy or radiculopathy, cervical region: Secondary | ICD-10-CM | POA: Diagnosis not present

## 2019-08-20 DIAGNOSIS — G894 Chronic pain syndrome: Secondary | ICD-10-CM | POA: Diagnosis not present

## 2019-08-20 DIAGNOSIS — M542 Cervicalgia: Secondary | ICD-10-CM | POA: Diagnosis not present

## 2019-08-20 DIAGNOSIS — Z79891 Long term (current) use of opiate analgesic: Secondary | ICD-10-CM | POA: Diagnosis not present

## 2019-08-20 DIAGNOSIS — M5136 Other intervertebral disc degeneration, lumbar region: Secondary | ICD-10-CM | POA: Diagnosis not present

## 2019-09-24 DIAGNOSIS — M47812 Spondylosis without myelopathy or radiculopathy, cervical region: Secondary | ICD-10-CM | POA: Diagnosis not present

## 2019-09-24 DIAGNOSIS — M542 Cervicalgia: Secondary | ICD-10-CM | POA: Diagnosis not present

## 2019-09-24 DIAGNOSIS — M4322 Fusion of spine, cervical region: Secondary | ICD-10-CM | POA: Diagnosis not present

## 2019-09-24 DIAGNOSIS — M5136 Other intervertebral disc degeneration, lumbar region: Secondary | ICD-10-CM | POA: Diagnosis not present

## 2019-10-29 DIAGNOSIS — M545 Low back pain: Secondary | ICD-10-CM | POA: Diagnosis not present

## 2019-10-29 DIAGNOSIS — M4322 Fusion of spine, cervical region: Secondary | ICD-10-CM | POA: Diagnosis not present

## 2019-10-29 DIAGNOSIS — M5136 Other intervertebral disc degeneration, lumbar region: Secondary | ICD-10-CM | POA: Diagnosis not present

## 2019-10-29 DIAGNOSIS — M542 Cervicalgia: Secondary | ICD-10-CM | POA: Diagnosis not present

## 2019-10-29 DIAGNOSIS — M47812 Spondylosis without myelopathy or radiculopathy, cervical region: Secondary | ICD-10-CM | POA: Diagnosis not present

## 2019-11-06 DIAGNOSIS — M5136 Other intervertebral disc degeneration, lumbar region: Secondary | ICD-10-CM | POA: Diagnosis not present

## 2019-11-12 ENCOUNTER — Ambulatory Visit (INDEPENDENT_AMBULATORY_CARE_PROVIDER_SITE_OTHER): Payer: Medicare Other | Admitting: Family Medicine

## 2019-11-12 ENCOUNTER — Other Ambulatory Visit: Payer: Self-pay

## 2019-11-12 ENCOUNTER — Encounter: Payer: Self-pay | Admitting: Family Medicine

## 2019-11-12 DIAGNOSIS — E039 Hypothyroidism, unspecified: Secondary | ICD-10-CM

## 2019-11-12 DIAGNOSIS — M542 Cervicalgia: Secondary | ICD-10-CM

## 2019-11-12 DIAGNOSIS — E7800 Pure hypercholesterolemia, unspecified: Secondary | ICD-10-CM | POA: Diagnosis not present

## 2019-11-12 DIAGNOSIS — K5903 Drug induced constipation: Secondary | ICD-10-CM | POA: Diagnosis not present

## 2019-11-12 DIAGNOSIS — I2510 Atherosclerotic heart disease of native coronary artery without angina pectoris: Secondary | ICD-10-CM | POA: Diagnosis not present

## 2019-11-12 DIAGNOSIS — T402X5A Adverse effect of other opioids, initial encounter: Secondary | ICD-10-CM | POA: Insufficient documentation

## 2019-11-12 DIAGNOSIS — G8929 Other chronic pain: Secondary | ICD-10-CM

## 2019-11-12 MED ORDER — POLYETHYLENE GLYCOL 3350 17 GM/SCOOP PO POWD: 17.0000 g | Freq: Two times a day (BID) | ORAL | 1 refills | Status: DC | PRN

## 2019-11-12 NOTE — Progress Notes (Signed)
John Jefferson. - 64 y.o. male MRN [id number]  Date of birth: 1955-10-18  Subjective No chief complaint on file.   HPI John Jefferson. is a 64 y.o. male with history of CAD s/p 3v CABG, chronic neck and back pain, hypothyroidism, and GERD here today for follow up.   -CAD:  Denies anginal symptoms. He reports that he discontinued plavix >1 year ago.  He states he did this under the guidance of his cardiologist because his blood with "too thin".  He is taking asa daily as well as atorvastatin.  He is not currently on a beta blocker.   He denies anginal symptoms, shortness of breath, or edema.    -Hypothyroidism:  He has been on a stable dose of levothyroxine.  Previous TSH normal.  Denies changing symptoms.   -Chronic neck/back pain:  He is followed by pain management and prescribed fentanyl patches with norco for breakthrough pain.  He is also prescribed gabapentin and duloxetine.  Cervicogenic headaches have improved.  He is having some constipation, has not tried anything for this.    ROS:  A comprehensive ROS was completed and negative except as noted per HPI   Past Medical History:  Diagnosis Date  . Anxiety   . CAD (coronary artery disease)   . Cervical strain 11/21/2014  . Cervicogenic headache 11/21/2014  . Depression   . Fibromyalgia   . GERD (gastroesophageal reflux disease)   . Gout 07/09/2014  . Hyperlipidemia   . Hypothyroidism 07/09/2014  . Memory difficulty 02/17/2015  . S/P CABG x 3 07/09/2014    Past Surgical History:  Procedure Laterality Date  . BACK SURGERY    . CATARACT EXTRACTION Bilateral   . CERVICAL FUSION    . CORONARY ARTERY BYPASS GRAFT  March 2015   Performed in [place]  . KNEE SURGERY    . ROTATOR CUFF REPAIR      Social History   Socioeconomic History  . Marital status: Married    Spouse name: Anderson Malta  . Number of children: 4  . Years of education: Master's  . Highest education level: Master's degree (e.g., MA, MS, MEng, MEd, MSW,  MBA)  Occupational History  . 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 Smoking status: Former Smoker    Quit date: 04/12/1985    Years since quitting: 34.6  . Smokeless tobacco: Never Used  Vaping Use  . Vaping Use: Never used  Substance and Sexual Activity  . Alcohol use: No    Alcohol/week: 0.0 standard drinks  . Drug use: No  . Sexual activity: Not Currently    Partners: Female  Other Topics Concern  . Not on file  Social History Narrative   Patient drinks 1 cup of caffeine daily.   Patient is right handed.   Social Determinants of Health   Financial Resource Strain:   . Difficulty of Paying Living Expenses:   Food Insecurity:   . Worried About Charity fundraiser in the Last Year:   . Arboriculturist in the Last Year:   Transportation Needs:   . Film/video editor (Medical):   Marland Kitchen Lack of Transportation (Non-Medical):   Physical Activity:   . Days of Exercise per Week:   . Minutes of Exercise per Session:   Stress:   . Feeling of Stress :   Social Connections:   . Frequency of Communication with Friends and Family:   . Frequency of Social Gatherings  with Friends and Family:   . Attends Religious Services:   . Active Member of Clubs or Organizations:   . Attends Archivist Meetings:   Marland Kitchen Marital Status:     Family History  Problem Relation Age of Onset  . Heart attack Father   . Stroke Mother   . Thyroid disease Mother   . Breast cancer Sister   . Thyroid disease Brother   . Thyroid disease Sister   . Thyroid disease Sister     Health Maintenance  Topic Date Due  . PNA vac Low Risk Adult (1 of 2 - PCV13) Never done  . INFLUENZA VACCINE  11/11/2019  . COLONOSCOPY  10/26/2020  . TETANUS/TDAP  03/17/2026  . COVID-19 Vaccine  Completed  . Hepatitis C Screening  Completed  . HIV Screening  Completed      ----------------------------------------------------------------------------------------------------------------------------------------------------------------------------------------------------------------- Physical Exam BP (!) 142/75 (BP Location: Left Arm, Patient Position: Sitting, Cuff Size: Normal)   Pulse 72   Temp 97.8 F (36.6 C) (Temporal)   Ht 5' 10.08" (1.78 m)   Wt (!) 205 lb 6.4 oz (93.2 kg)   SpO2 97%   BMI 29.41 kg/m   Physical Exam Constitutional:      Appearance: Normal appearance.  HENT:     Head: Normocephalic and atraumatic.  Eyes:     General: No scleral icterus. Cardiovascular:     Rate and Rhythm: Normal rate and regular rhythm.  Pulmonary:     Effort: Pulmonary effort is normal.     Breath sounds: Normal breath sounds.  Musculoskeletal:     Cervical back: Neck supple.  Neurological:     General: No focal deficit present.     Mental Status: He is alert.  Psychiatric:        Mood and Affect: Mood normal.        Behavior: Behavior normal.     ------------------------------------------------------------------------------------------------------------------------------------------------------------------------------------------------------------------- Assessment and Plan  CAD (coronary artery disease) Stable at this time.  He is no longer taking plavix.  He will continue ASA daily.   Hyperlipidemia Lab Results  Component Value Date   LDLCALC 89 05/15/2019  Continue atorvastatin at current dose.   Hypothyroidism Stable TSH and no change in symptoms.  Continue levothyroxine at current dose.  Update TSH at next visit.   Chronic neck pain Managed by pain management.  Stable at this time with chronic opioids, cymbalta and gabapentin.    Therapeutic opioid-induced constipation (OIC) Recommend adding miralax initially.  Can consider prescription medication if not having adequate response to miralax.     No orders of the defined types  were placed in this encounter.   Return in about 6 months (around 05/14/2020) for Hypothyroid/CAD.    This visit occurred during the SARS-CoV-2 public health emergency.  Safety protocols were in place, including screening questions prior to the visit, additional usage of staff PPE, and extensive cleaning of exam room while observing appropriate contact time as indicated for disinfecting solutions.

## 2019-11-12 NOTE — Patient Instructions (Signed)
Continue current medications Try adding miralax 1-2 times each day for constipation.  Follow up with me in 6 months.

## 2019-11-12 NOTE — Assessment & Plan Note (Signed)
Lab Results  Component Value Date   LDLCALC 89 05/15/2019  Continue atorvastatin at current dose.

## 2019-11-12 NOTE — Assessment & Plan Note (Signed)
Managed by pain management.  Stable at this time with chronic opioids, cymbalta and gabapentin.

## 2019-11-12 NOTE — Assessment & Plan Note (Signed)
Stable at this time.  He is no longer taking plavix.  He will continue ASA daily.

## 2019-11-12 NOTE — Assessment & Plan Note (Signed)
Recommend adding miralax initially.  Can consider prescription medication if not having adequate response to miralax.

## 2019-11-12 NOTE — Assessment & Plan Note (Signed)
Stable TSH and no change in symptoms.  Continue levothyroxine at current dose.  Update TSH at next visit.

## 2019-12-03 DIAGNOSIS — M5136 Other intervertebral disc degeneration, lumbar region: Secondary | ICD-10-CM | POA: Diagnosis not present

## 2019-12-03 DIAGNOSIS — G894 Chronic pain syndrome: Secondary | ICD-10-CM | POA: Diagnosis not present

## 2019-12-03 DIAGNOSIS — M4322 Fusion of spine, cervical region: Secondary | ICD-10-CM | POA: Diagnosis not present

## 2019-12-31 DIAGNOSIS — M4322 Fusion of spine, cervical region: Secondary | ICD-10-CM | POA: Diagnosis not present

## 2019-12-31 DIAGNOSIS — M5136 Other intervertebral disc degeneration, lumbar region: Secondary | ICD-10-CM | POA: Diagnosis not present

## 2019-12-31 DIAGNOSIS — G894 Chronic pain syndrome: Secondary | ICD-10-CM | POA: Diagnosis not present

## 2020-02-04 DIAGNOSIS — M5136 Other intervertebral disc degeneration, lumbar region: Secondary | ICD-10-CM | POA: Diagnosis not present

## 2020-02-04 DIAGNOSIS — M4322 Fusion of spine, cervical region: Secondary | ICD-10-CM | POA: Diagnosis not present

## 2020-02-04 DIAGNOSIS — G894 Chronic pain syndrome: Secondary | ICD-10-CM | POA: Diagnosis not present

## 2020-02-11 ENCOUNTER — Telehealth (INDEPENDENT_AMBULATORY_CARE_PROVIDER_SITE_OTHER): Payer: Medicare Other | Admitting: Family Medicine

## 2020-02-11 ENCOUNTER — Telehealth: Payer: Self-pay

## 2020-02-11 ENCOUNTER — Encounter: Payer: Self-pay | Admitting: Family Medicine

## 2020-02-11 DIAGNOSIS — J0100 Acute maxillary sinusitis, unspecified: Secondary | ICD-10-CM | POA: Diagnosis not present

## 2020-02-11 MED ORDER — DOXYCYCLINE HYCLATE 100 MG PO TABS: 100.0000 mg | ORAL_TABLET | Freq: Two times a day (BID) | ORAL | 0 refills | Status: DC

## 2020-02-11 NOTE — Progress Notes (Signed)
Patient states possible sinus infection.

## 2020-02-11 NOTE — Telephone Encounter (Signed)
Pt lvm stating he has a sinus infection and would like medication sent to the pharmacy for him.  Called the patient and scheduled him for a MyChart visit with Dr. Zigmund Daniel.   Medications are not called into the pharmacy without a visit for new symptoms.

## 2020-02-11 NOTE — Progress Notes (Signed)
John Jefferson. - 64 y.o. male MRN [id number]  Date of birth: 12/29/1955   This visit type was conducted due to national recommendations for restrictions regarding the COVID-19 Pandemic (e.g. social distancing).  This format is felt to be most appropriate for this patient at this time.  All issues noted in this document were discussed and addressed.  No physical exam was performed (except for noted visual exam findings with Video Visits).  I discussed the limitations of evaluation and management by telemedicine and the availability of in person appointments. The patient expressed understanding and agreed to proceed.  I connected with@ on 02/11/20 at  2:40 PM EDT by a video enabled telemedicine application and verified that I am speaking with the correct person using two identifiers.  Present at visit: Luetta Nutting, DO John Jefferson.   Patient Location: Home [address] [place] [address]   Provider location:   [facility]  Chief Complaint  Patient presents with  . Sinusitis    HPI  Uzziel Russey. is a 64 y.o. male who presents via audio/video conferencing for a telehealth visit today.  He has complaint of sinus pain and pressure with thick yellow nasal discharge.   He does have mild cough from drainage but denies shortness of breath.  He has not had fever, chills or body aches.  He has not really tried anything for management of symptoms so far.   He does report having recurrent sinus infections a couple times each year.  Doxycycline has worked well for him previously.     ROS:  A comprehensive ROS was completed and negative except as noted per HPI  Past Medical History:  Diagnosis Date  . Anxiety   . CAD (coronary artery disease)   . Cervical strain 11/21/2014  . Cervicogenic headache 11/21/2014  . Depression   . Fibromyalgia   . GERD (gastroesophageal reflux disease)   . Gout 07/09/2014  . Hyperlipidemia   . Hypothyroidism 07/09/2014  . Memory difficulty  02/17/2015  . S/P CABG x 3 07/09/2014    Past Surgical History:  Procedure Laterality Date  . BACK SURGERY    . CATARACT EXTRACTION Bilateral   . CERVICAL FUSION    . CORONARY ARTERY BYPASS GRAFT  March 2015   Performed in [place]  . KNEE SURGERY    . ROTATOR CUFF REPAIR      Family History  Problem Relation Age of Onset  . Heart attack Father   . Stroke Mother   . Thyroid disease Mother   . Breast cancer Sister   . Thyroid disease Brother   . Thyroid disease Sister   . Thyroid disease Sister     Social History   Socioeconomic History  . Marital status: Married    Spouse name: Anderson Malta  . Number of children: 4  . Years of education: Master's  . Highest education level: Master's degree (e.g., MA, MS, MEng, MEd, MSW, MBA)  Occupational History  . 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 Smoking status: Former Smoker    Quit date: 04/12/1985    Years since quitting: 34.8  . Smokeless tobacco: Never Used  Vaping Use  . Vaping Use: Never used  Substance and Sexual Activity  . Alcohol use: No    Alcohol/week: 0.0 standard drinks  . Drug use: No  . Sexual activity: Not Currently    Partners: Female  Other Topics Concern  . Not on file  Social History Narrative   Patient drinks 1 cup of caffeine daily.   Patient is right handed.   Social Determinants of Health   Financial Resource Strain:   . Difficulty of Paying Living Expenses: Not on file  Food Insecurity:   . Worried About Charity fundraiser in the Last Year: Not on file  . Ran Out of Food in the Last Year: Not on file  Transportation Needs:   . Lack of Transportation (Medical): Not on file  . Lack of Transportation (Non-Medical): Not on file  Physical Activity:   . Days of Exercise per Week: Not on file  . Minutes of Exercise per Session: Not on file  Stress:   . Feeling of Stress : Not on file  Social Connections:   . Frequency of Communication with Friends and Family: Not on file   . Frequency of Social Gatherings with Friends and Family: Not on file  . Attends Religious Services: Not on file  . Active Member of Clubs or Organizations: Not on file  . Attends Archivist Meetings: Not on file  . Marital Status: Not on file  Intimate Partner Violence:   . Fear of Current or Ex-Partner: Not on file  . Emotionally Abused: Not on file  . Physically Abused: Not on file  . Sexually Abused: Not on file     Current Outpatient Medications:  .  allopurinol (ZYLOPRIM) 300 MG tablet, Take 1 tablet (300 mg total) by mouth daily., Disp: 90 tablet, Rfl: 3 .  aspirin 81 MG tablet, Take 1 tablet (81 mg total) by mouth daily., Disp: 90 tablet, Rfl: 0 .  atorvastatin (LIPITOR) 80 MG tablet, Take 1 tablet (80 mg total) by mouth daily., Disp: 90 tablet, Rfl: 3 .  clopidogrel (PLAVIX) 75 MG tablet, Take 75 mg by mouth daily., Disp: , Rfl:  .  DULoxetine (CYMBALTA) 60 MG capsule, Take 1 capsule (60 mg total) by mouth daily., Disp: 90 capsule, Rfl: 3 .  fentaNYL (DURAGESIC - DOSED MCG/HR) 25 MCG/HR patch, UNW AND APP 1 PA TO SKIN Q 72 H. REMOVE OLD PATCHES B ADDING NEW PA, Disp: , Rfl: 0 .  gabapentin (NEURONTIN) 300 MG capsule, Take 2 capsules (600 mg total) by mouth 3 (three) times daily., Disp: 180 capsule, Rfl: 3 .  HYDROcodone-acetaminophen (NORCO/VICODIN) 5-325 MG tablet, Take 1 tablet by mouth every 6 (six) hours as needed., Disp: 30 tablet, Rfl: 0 .  levothyroxine (SYNTHROID) 75 MCG tablet, Take 1 tablet by mouth daily before breakfast, Disp: 90 tablet, Rfl: 3 .  pantoprazole (PROTONIX) 40 MG tablet, Take 1 tablet (40 mg total) by mouth daily., Disp: 90 tablet, Rfl: 3 .  polyethylene glycol powder (GLYCOLAX/MIRALAX) 17 GM/SCOOP powder, Take 17 g by mouth 2 (two) times daily as needed., Disp: 3350 g, Rfl: 1 .  topiramate (TOPAMAX) 50 MG tablet, , Disp: , Rfl:  .  doxycycline (VIBRA-TABS) 100 MG tablet, Take 1 tablet (100 mg total) by mouth 2 (two) times daily., Disp: 20  tablet, Rfl: 0  EXAM:  VITALS per patient if applicable: Wt 205 lb (93 kg)   BMI 29.35 kg/m   GENERAL: alert, oriented, appears well and in no acute distress  HEENT: atraumatic, conjunttiva clear, no obvious abnormalities on inspection of external nose and ears  NECK: normal movements of the head and neck  LUNGS: on inspection no signs of respiratory distress, breathing rate appears normal, no obvious gross SOB, gasping or wheezing  CV: no obvious cyanosis  MS: moves all visible extremities without noticeable abnormality  PSYCH/NEURO: pleasant and cooperative, no obvious depression or anxiety, speech and thought processing grossly intact  ASSESSMENT AND PLAN:  Discussed the following assessment and plan:  Acute maxillary sinusitis Recommend supportive care with increased fluids and rest.  Start doxycycline 100mg  bid.  He may use OTC medications as needed for symptomatic control.  Recommend follow up if not improving.       I discussed the assessment and treatment plan with the patient. The patient was provided an opportunity to ask questions and all were answered. The patient agreed with the plan and demonstrated an understanding of the instructions.   The patient was advised to call back or seek an in-person evaluation if the symptoms worsen or if the condition fails to improve as anticipated.    Luetta Nutting, DO

## 2020-02-11 NOTE — Assessment & Plan Note (Addendum)
Recommend supportive care with increased fluids and rest.  Start doxycycline 100mg  bid.  He may use OTC medications as needed for symptomatic control.  Recommend follow up if not improved after prescribed treatment.

## 2020-02-18 ENCOUNTER — Other Ambulatory Visit: Payer: Self-pay

## 2020-02-18 MED ORDER — CLOPIDOGREL BISULFATE 75 MG PO TABS: 75.0000 mg | ORAL_TABLET | Freq: Every day | ORAL | 2 refills | Status: DC

## 2020-02-20 DIAGNOSIS — D485 Neoplasm of uncertain behavior of skin: Secondary | ICD-10-CM | POA: Diagnosis not present

## 2020-02-20 DIAGNOSIS — L570 Actinic keratosis: Secondary | ICD-10-CM | POA: Diagnosis not present

## 2020-03-10 DIAGNOSIS — G894 Chronic pain syndrome: Secondary | ICD-10-CM | POA: Diagnosis not present

## 2020-03-10 DIAGNOSIS — M5136 Other intervertebral disc degeneration, lumbar region: Secondary | ICD-10-CM | POA: Diagnosis not present

## 2020-03-10 DIAGNOSIS — M4322 Fusion of spine, cervical region: Secondary | ICD-10-CM | POA: Diagnosis not present

## 2020-03-12 DIAGNOSIS — L570 Actinic keratosis: Secondary | ICD-10-CM | POA: Diagnosis not present

## 2020-03-12 DIAGNOSIS — L80 Vitiligo: Secondary | ICD-10-CM | POA: Diagnosis not present

## 2020-04-07 DIAGNOSIS — Z79891 Long term (current) use of opiate analgesic: Secondary | ICD-10-CM | POA: Diagnosis not present

## 2020-04-07 DIAGNOSIS — M4322 Fusion of spine, cervical region: Secondary | ICD-10-CM | POA: Diagnosis not present

## 2020-04-07 DIAGNOSIS — M5136 Other intervertebral disc degeneration, lumbar region: Secondary | ICD-10-CM | POA: Diagnosis not present

## 2020-04-07 DIAGNOSIS — Z79899 Other long term (current) drug therapy: Secondary | ICD-10-CM | POA: Diagnosis not present

## 2020-04-07 DIAGNOSIS — G894 Chronic pain syndrome: Secondary | ICD-10-CM | POA: Diagnosis not present

## 2020-04-16 DIAGNOSIS — M5116 Intervertebral disc disorders with radiculopathy, lumbar region: Secondary | ICD-10-CM | POA: Diagnosis not present

## 2020-05-05 ENCOUNTER — Other Ambulatory Visit: Payer: Self-pay

## 2020-05-05 ENCOUNTER — Other Ambulatory Visit: Payer: Self-pay | Admitting: Family Medicine

## 2020-05-05 DIAGNOSIS — E7800 Pure hypercholesterolemia, unspecified: Secondary | ICD-10-CM

## 2020-05-05 MED ORDER — PANTOPRAZOLE SODIUM 40 MG PO TBEC: DELAYED_RELEASE_TABLET | ORAL | 0 refills | Status: DC

## 2020-05-05 MED ORDER — ATORVASTATIN CALCIUM 80 MG PO TABS: ORAL_TABLET | ORAL | 0 refills | Status: DC

## 2020-05-05 MED ORDER — LEVOTHYROXINE SODIUM 75 MCG PO TABS: 75.0000 ug | ORAL_TABLET | Freq: Every day | ORAL | 0 refills | Status: DC

## 2020-05-05 MED ORDER — DULOXETINE HCL 60 MG PO CPEP: ORAL_CAPSULE | ORAL | 0 refills | Status: DC

## 2020-05-14 ENCOUNTER — Other Ambulatory Visit: Payer: Self-pay

## 2020-05-14 ENCOUNTER — Encounter: Payer: Self-pay | Admitting: Family Medicine

## 2020-05-14 ENCOUNTER — Ambulatory Visit (INDEPENDENT_AMBULATORY_CARE_PROVIDER_SITE_OTHER): Payer: Medicare Other | Admitting: Family Medicine

## 2020-05-14 VITALS — BP 113/77 | HR 77 | Temp 97.7°F | Wt 203.0 lb

## 2020-05-14 DIAGNOSIS — E039 Hypothyroidism, unspecified: Secondary | ICD-10-CM

## 2020-05-14 DIAGNOSIS — E790 Hyperuricemia without signs of inflammatory arthritis and tophaceous disease: Secondary | ICD-10-CM

## 2020-05-14 DIAGNOSIS — E7800 Pure hypercholesterolemia, unspecified: Secondary | ICD-10-CM | POA: Diagnosis not present

## 2020-05-14 DIAGNOSIS — T402X5A Adverse effect of other opioids, initial encounter: Secondary | ICD-10-CM

## 2020-05-14 DIAGNOSIS — K5903 Drug induced constipation: Secondary | ICD-10-CM

## 2020-05-14 DIAGNOSIS — M1A9XX0 Chronic gout, unspecified, without tophus (tophi): Secondary | ICD-10-CM | POA: Diagnosis not present

## 2020-05-14 DIAGNOSIS — M5030 Other cervical disc degeneration, unspecified cervical region: Secondary | ICD-10-CM | POA: Diagnosis not present

## 2020-05-14 DIAGNOSIS — I2510 Atherosclerotic heart disease of native coronary artery without angina pectoris: Secondary | ICD-10-CM | POA: Diagnosis not present

## 2020-05-14 MED ORDER — CLOPIDOGREL BISULFATE 75 MG PO TABS: 75.0000 mg | ORAL_TABLET | Freq: Every day | ORAL | 3 refills | Status: DC

## 2020-05-14 MED ORDER — PANTOPRAZOLE SODIUM 40 MG PO TBEC: DELAYED_RELEASE_TABLET | ORAL | 3 refills | Status: DC

## 2020-05-14 MED ORDER — ATORVASTATIN CALCIUM 80 MG PO TABS: ORAL_TABLET | ORAL | 3 refills | Status: DC

## 2020-05-14 MED ORDER — ALLOPURINOL 300 MG PO TABS: 300.0000 mg | ORAL_TABLET | Freq: Every day | ORAL | 3 refills | Status: DC

## 2020-05-14 MED ORDER — GABAPENTIN 300 MG PO CAPS: 600.0000 mg | ORAL_CAPSULE | Freq: Three times a day (TID) | ORAL | 3 refills | Status: DC

## 2020-05-14 MED ORDER — DULOXETINE HCL 60 MG PO CPEP: ORAL_CAPSULE | ORAL | 3 refills | Status: DC

## 2020-05-14 NOTE — Assessment & Plan Note (Addendum)
Denies anginal symptoms.  Continue ASA Continue atorvstatin, update lipid panel

## 2020-05-14 NOTE — Progress Notes (Addendum)
John Jefferson. - 65 y.o. male MRN [id number]  Date of birth: 02-Apr-1956  Subjective Chief Complaint  Patient presents with  . Coronary Artery Disease  . Thyroid Problem    HPI John Jefferson. is a 65 y.o. male here today for follow up visit.  He has a history of GERD CAD s/p CABG x3, hypothyroidism, chronic neck and back pain.  Continues on 81mg  asa due to history of CAD with CABG.  He is doing well with this.  He also remains on atorvastatin for management of lipids.  He denies side effects from medications.  He has not had anginal symptoms  He sees pain management for opioid pain medication.  I have been managing his gabapentin and duloxetine.  Stable at this time.  Denies side effects related to medication.  Topiramate was recently increased by pain clinic due to headaches.  Feels good at current dose of levothyroxine. Taking daily.   ROS:  A comprehensive ROS was completed and negative except as noted per HPI  No Known Allergies  Past Medical History:  Diagnosis Date  . Anxiety   . CAD (coronary artery disease)   . Cervical strain 11/21/2014  . Cervicogenic headache 11/21/2014  . Depression   . Fibromyalgia   . GERD (gastroesophageal reflux disease)   . Gout 07/09/2014  . Hyperlipidemia   . Hypothyroidism 07/09/2014  . Memory difficulty 02/17/2015  . S/P CABG x 3 07/09/2014    Past Surgical History:  Procedure Laterality Date  . BACK SURGERY    . CATARACT EXTRACTION Bilateral   . CERVICAL FUSION    . CORONARY ARTERY BYPASS GRAFT  March 2015   Performed in [place]  . KNEE SURGERY    . ROTATOR CUFF REPAIR      Social History   Socioeconomic History  . Marital status: Married    Spouse name: Anderson Malta  . Number of children: 4  . Years of education: Master's  . Highest education level: Master's degree (e.g., MA, MS, MEng, MEd, MSW, MBA)  Occupational History  . 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 Smoking status: Former Smoker     Quit date: 04/12/1985    Years since quitting: 35.1  . Smokeless tobacco: Never Used  Vaping Use  . Vaping Use: Never used  Substance and Sexual Activity  . Alcohol use: No    Alcohol/week: 0.0 standard drinks  . Drug use: No  . Sexual activity: Not Currently    Partners: Female  Other Topics Concern  . Not on file  Social History Narrative   Patient drinks 1 cup of caffeine daily.   Patient is right handed.   Social Determinants of Health   Financial Resource Strain: Not on file  Food Insecurity: Not on file  Transportation Needs: Not on file  Physical Activity: Not on file  Stress: Not on file  Social Connections: Not on file    Family History  Problem Relation Age of Onset  . Heart attack Father   . Stroke Mother   . Thyroid disease Mother   . Breast cancer Sister   . Thyroid disease Brother   . Thyroid disease Sister   . Thyroid disease Sister     Health Maintenance  Topic Date Due  . PNA vac Low Risk Adult (1 of 2 - PCV13) Never done  . COVID-19 Vaccine (3 - Booster for Moderna series) 12/24/2019  . COLONOSCOPY (Pts 45-63yrs Insurance coverage will need  to be confirmed)  10/26/2020  . TETANUS/TDAP  03/17/2026  . INFLUENZA VACCINE  Completed  . Hepatitis C Screening  Completed     ----------------------------------------------------------------------------------------------------------------------------------------------------------------------------------------------------------------- Physical Exam BP 113/77 (BP Location: Right Arm, Patient Position: Sitting, Cuff Size: Normal)   Pulse 77   Temp 97.7 F (36.5 C) (Oral)   Wt 203 lb (92.1 kg)   SpO2 94%   BMI 29.06 kg/m   Physical Exam Constitutional:      Appearance: Normal appearance.  HENT:     Head: Normocephalic and atraumatic.  Eyes:     General: No scleral icterus. Cardiovascular:     Rate and Rhythm: Normal rate and regular rhythm.  Pulmonary:     Effort: Pulmonary effort is normal.      Breath sounds: Normal breath sounds.  Musculoskeletal:     Cervical back: Neck supple.  Skin:    General: Skin is warm and dry.  Neurological:     General: No focal deficit present.     Mental Status: He is alert.  Psychiatric:        Mood and Affect: Mood normal.        Behavior: Behavior normal.     ------------------------------------------------------------------------------------------------------------------------------------------------------------------------------------------------------------------- Assessment and Plan  CAD (coronary artery disease) Denies anginal symptoms.  Continue ASA Continue atorvstatin, update lipid panel  Therapeutic opioid-induced constipation (OIC) Stable with miralax as needed.   Hypothyroidism Feeling ok at current dose of levothyroxine.  Update TSH  Degenerative disc disease, cervical Followed by pain management Treated with chronic opioids.  We'll continue gabapentin and cymbalta as adjunctive therapy as well. Rx renewed.   Gout No recent flares.  Update uric acid.  Continue allopurinol.    Meds ordered this encounter  Medications  . allopurinol (ZYLOPRIM) 300 MG tablet    Sig: Take 1 tablet (300 mg total) by mouth daily.    Dispense:  90 tablet    Refill:  3  . atorvastatin (LIPITOR) 80 MG tablet    Sig: TAKE 1 TABLET(80 MG) BY MOUTH DAILY    Dispense:  90 tablet    Refill:  3  . clopidogrel (PLAVIX) 75 MG tablet    Sig: Take 1 tablet (75 mg total) by mouth daily.    Dispense:  90 tablet    Refill:  3  . DULoxetine (CYMBALTA) 60 MG capsule    Sig: TAKE 1 CAPSULE(60 MG) BY MOUTH DAILY    Dispense:  90 capsule    Refill:  3  . gabapentin (NEURONTIN) 300 MG capsule    Sig: Take 2 capsules (600 mg total) by mouth 3 (three) times daily.    Dispense:  540 capsule    Refill:  3    Refill [id number]  . pantoprazole (PROTONIX) 40 MG tablet    Sig: TAKE 1 TABLET(40 MG) BY MOUTH DAILY    Dispense:  90 tablet    Refill:  3     Return in about 6 months (around 11/11/2020) for HLD/CAD.    This visit occurred during the SARS-CoV-2 public health emergency.  Safety protocols were in place, including screening questions prior to the visit, additional usage of staff PPE, and extensive cleaning of exam room while observing appropriate contact time as indicated for disinfecting solutions.

## 2020-05-14 NOTE — Patient Instructions (Signed)
Great to see you today! Have labs completed Continue current medications.  Try topical voltaren(diclofenac) gel for the hands.  See me again in about 6 months.

## 2020-05-14 NOTE — Assessment & Plan Note (Signed)
No recent flares.  Update uric acid.  Continue allopurinol.

## 2020-05-14 NOTE — Assessment & Plan Note (Signed)
Followed by pain management Treated with chronic opioids.  We'll continue gabapentin and cymbalta as adjunctive therapy as well. Rx renewed.

## 2020-05-14 NOTE — Assessment & Plan Note (Signed)
Feeling ok at current dose of levothyroxine.  Update TSH

## 2020-05-14 NOTE — Assessment & Plan Note (Signed)
Stable with miralax as needed.

## 2020-05-21 DIAGNOSIS — E790 Hyperuricemia without signs of inflammatory arthritis and tophaceous disease: Secondary | ICD-10-CM | POA: Diagnosis not present

## 2020-05-21 DIAGNOSIS — E039 Hypothyroidism, unspecified: Secondary | ICD-10-CM | POA: Diagnosis not present

## 2020-05-21 DIAGNOSIS — I2510 Atherosclerotic heart disease of native coronary artery without angina pectoris: Secondary | ICD-10-CM | POA: Diagnosis not present

## 2020-05-21 DIAGNOSIS — E7800 Pure hypercholesterolemia, unspecified: Secondary | ICD-10-CM | POA: Diagnosis not present

## 2020-05-22 DIAGNOSIS — M4322 Fusion of spine, cervical region: Secondary | ICD-10-CM | POA: Diagnosis not present

## 2020-05-22 DIAGNOSIS — G894 Chronic pain syndrome: Secondary | ICD-10-CM | POA: Diagnosis not present

## 2020-05-22 DIAGNOSIS — M5116 Intervertebral disc disorders with radiculopathy, lumbar region: Secondary | ICD-10-CM | POA: Diagnosis not present

## 2020-05-22 LAB — CBC
HCT: 47.6 % (ref 38.5–50.0)
Hemoglobin: 15.8 g/dL (ref 13.2–17.1)
MCH: 30.2 pg (ref 27.0–33.0)
MCHC: 33.2 g/dL (ref 32.0–36.0)
MCV: 91 fL (ref 80.0–100.0)
MPV: 9.9 fL (ref 7.5–12.5)
Platelets: 238 10*3/uL (ref 140–400)
RBC: 5.23 10*6/uL (ref 4.20–5.80)
RDW: 13.2 % (ref 11.0–15.0)
WBC: 7.3 10*3/uL (ref 3.8–10.8)

## 2020-05-22 LAB — COMPLETE METABOLIC PANEL WITH GFR
AG Ratio: 1.8 (calc) (ref 1.0–2.5)
ALT: 38 U/L (ref 9–46)
AST: 36 U/L — ABNORMAL HIGH (ref 10–35)
Albumin: 4.7 g/dL (ref 3.6–5.1)
Alkaline phosphatase (APISO): 76 U/L (ref 35–144)
BUN: 18 mg/dL (ref 7–25)
CO2: 27 mmol/L (ref 20–32)
Calcium: 10.1 mg/dL (ref 8.6–10.3)
Chloride: 106 mmol/L (ref 98–110)
Creat: 0.97 mg/dL (ref 0.70–1.25)
GFR, Est African American: 94 mL/min/{1.73_m2} (ref >=60)
GFR, Est Non African American: 81 mL/min/{1.73_m2} (ref >=60)
Globulin: 2.6 g/dL (calc) (ref 1.9–3.7)
Glucose, Bld: 100 mg/dL — ABNORMAL HIGH (ref 65–99)
Potassium: 4.4 mmol/L (ref 3.5–5.3)
Sodium: 142 mmol/L (ref 135–146)
Total Bilirubin: 0.6 mg/dL (ref 0.2–1.2)
Total Protein: 7.3 g/dL (ref 6.1–8.1)

## 2020-05-22 LAB — LIPID PANEL
Cholesterol: 145 mg/dL (ref <200)
HDL: 48 mg/dL (ref >=40)
LDL Cholesterol (Calc): 77 mg/dL (calc)
Non-HDL Cholesterol (Calc): 97 mg/dL (calc) (ref <130)
Total CHOL/HDL Ratio: 3 (calc) (ref <5.0)
Triglycerides: 115 mg/dL (ref <150)

## 2020-05-22 LAB — TSH: TSH: 1.91 mIU/L (ref 0.40–4.50)

## 2020-05-22 LAB — URIC ACID: Uric Acid, Serum: 4.4 mg/dL (ref 4.0–8.0)

## 2020-05-27 ENCOUNTER — Other Ambulatory Visit: Payer: Self-pay

## 2020-05-27 ENCOUNTER — Telehealth: Payer: Self-pay

## 2020-05-27 NOTE — Telephone Encounter (Signed)
Per Dr. Zigmund Daniel, ok to D/C clopidogrel.   Contacted the pharmacy to d/c refills.

## 2020-05-27 NOTE — Telephone Encounter (Signed)
Clopidogrel can be discontinued per cardiology.  He should continue on aspirin.

## 2020-05-27 NOTE — Telephone Encounter (Signed)
Pt lvm advising Clopidogrel should not be on med list. He's asked the pharmacy to stop sending refill requests for the med to PCP; but they have not stopped.   Should patient be taking Clopidogrel?

## 2020-06-19 ENCOUNTER — Other Ambulatory Visit: Payer: Self-pay | Admitting: Family Medicine

## 2020-06-23 DIAGNOSIS — M5116 Intervertebral disc disorders with radiculopathy, lumbar region: Secondary | ICD-10-CM | POA: Diagnosis not present

## 2020-06-23 DIAGNOSIS — M4322 Fusion of spine, cervical region: Secondary | ICD-10-CM | POA: Diagnosis not present

## 2020-06-23 DIAGNOSIS — G894 Chronic pain syndrome: Secondary | ICD-10-CM | POA: Diagnosis not present

## 2020-07-21 DIAGNOSIS — M5116 Intervertebral disc disorders with radiculopathy, lumbar region: Secondary | ICD-10-CM | POA: Diagnosis not present

## 2020-07-21 DIAGNOSIS — G894 Chronic pain syndrome: Secondary | ICD-10-CM | POA: Diagnosis not present

## 2020-07-21 DIAGNOSIS — M4322 Fusion of spine, cervical region: Secondary | ICD-10-CM | POA: Diagnosis not present

## 2020-09-02 ENCOUNTER — Telehealth: Payer: Self-pay | Admitting: Family Medicine

## 2020-09-02 NOTE — Chronic Care Management (AMB) (Signed)
  Chronic Care Management   Outreach Note  09/02/2020 Name: John Jefferson. MRN: [id number] DOB: 04-22-1955  Referred by: Luetta Nutting, DO Reason for referral : No chief complaint on file.   An unsuccessful telephone outreach was attempted today. The patient was referred to the pharmacist for assistance with care management and care coordination.   Follow Up Plan:   Lauretta Grill Upstream Scheduler

## 2020-09-09 ENCOUNTER — Other Ambulatory Visit: Payer: Self-pay | Admitting: Family Medicine

## 2020-09-10 ENCOUNTER — Other Ambulatory Visit: Payer: Self-pay | Admitting: *Deleted

## 2020-09-10 MED ORDER — LEVOTHYROXINE SODIUM 75 MCG PO TABS: ORAL_TABLET | ORAL | 1 refills | Status: DC

## 2020-09-24 ENCOUNTER — Telehealth: Payer: Self-pay | Admitting: Family Medicine

## 2020-09-24 NOTE — Progress Notes (Signed)
  Chronic Care Management   Outreach Note  09/24/2020 Name: John Jefferson. MRN: [id number] DOB: [birth year]  Referred by: Luetta Nutting, DO Reason for referral : No chief complaint on file.   A second unsuccessful telephone outreach was attempted today. The patient was referred to pharmacist for assistance with care management and care coordination.  Follow Up Plan:   Lauretta Grill Upstream Scheduler

## 2020-09-29 DIAGNOSIS — G894 Chronic pain syndrome: Secondary | ICD-10-CM | POA: Diagnosis not present

## 2020-09-29 DIAGNOSIS — M5116 Intervertebral disc disorders with radiculopathy, lumbar region: Secondary | ICD-10-CM | POA: Diagnosis not present

## 2020-09-29 DIAGNOSIS — M4322 Fusion of spine, cervical region: Secondary | ICD-10-CM | POA: Diagnosis not present

## 2020-09-29 DIAGNOSIS — I10 Essential (primary) hypertension: Secondary | ICD-10-CM | POA: Diagnosis not present

## 2020-10-07 ENCOUNTER — Telehealth: Payer: Self-pay | Admitting: Family Medicine

## 2020-10-07 NOTE — Progress Notes (Signed)
  Chronic Care Management   Outreach Note  10/07/2020 Name: John Jefferson. MRN: [id number] DOB: [birth year]  Referred by: Luetta Nutting, DO Reason for referral : No chief complaint on file.   Third unsuccessful telephone outreach was attempted today. The patient was referred to the pharmacist for assistance with care management and care coordination.   Follow Up Plan:   Lauretta Grill Upstream Scheduler

## 2020-10-20 NOTE — Progress Notes (Signed)
HPI: FU CAD.  Patient is status post coronary artery bypass graft in 2015 in [place] (LIMA to LAD, RIMA to OM and SVG to RPL).  Echocardiogram January 2021 showed normal LV function, mild left ventricular hypertrophy, mild aortic insufficiency.  Abdominal ultrasound January 2021 showed no aneurysm.  Since last seen the patient denies any dyspnea on exertion, orthopnea, PND, pedal edema, palpitations, syncope or chest pain.   Current Outpatient Medications  Medication Sig Dispense Refill   allopurinol (ZYLOPRIM) 300 MG tablet Take 1 tablet (300 mg total) by mouth daily. 90 tablet 3   aspirin 81 MG tablet Take 1 tablet (81 mg total) by mouth daily. 90 tablet 0   atorvastatin (LIPITOR) 80 MG tablet TAKE 1 TABLET(80 MG) BY MOUTH DAILY 90 tablet 3   DULoxetine (CYMBALTA) 60 MG capsule TAKE 1 CAPSULE(60 MG) BY MOUTH DAILY 90 capsule 1   fentaNYL (DURAGESIC - DOSED MCG/HR) 25 MCG/HR patch UNW AND APP 1 PA TO SKIN Q 72 H. REMOVE OLD PATCHES B ADDING NEW PA  0   gabapentin (NEURONTIN) 300 MG capsule Take 2 capsules (600 mg total) by mouth 3 (three) times daily. 540 capsule 3   HYDROcodone-acetaminophen (NORCO/VICODIN) 5-325 MG tablet Take 1 tablet by mouth every 6 (six) hours as needed. 30 tablet 0   levothyroxine (SYNTHROID) 75 MCG tablet TAKE 1 TABLET(75 MCG) BY MOUTH DAILY BEFORE BREAKFAST 90 tablet 1   pantoprazole (PROTONIX) 40 MG tablet TAKE 1 TABLET(40 MG) BY MOUTH DAILY 90 tablet 3   topiramate (TOPAMAX) 100 MG tablet Take 100 mg by mouth 2 (two) times daily.     polyethylene glycol powder (GLYCOLAX/MIRALAX) 17 GM/SCOOP powder Take 17 g by mouth 2 (two) times daily as needed. (Patient not taking: Reported on 10/27/2020) 3350 g 1   No current facility-administered medications for this visit.     Past Medical History:  Diagnosis Date   Anxiety    CAD (coronary artery disease)    Cervical strain 11/21/2014   Cervicogenic headache 11/21/2014   Depression    Fibromyalgia    GERD  (gastroesophageal reflux disease)    Gout 07/09/2014   Hyperlipidemia    Hypothyroidism 07/09/2014   Memory difficulty 02/17/2015   S/P CABG x 3 07/09/2014    Past Surgical History:  Procedure Laterality Date   BACK SURGERY     CATARACT EXTRACTION Bilateral    CERVICAL FUSION     CORONARY ARTERY BYPASS GRAFT  March 2015   Performed in [place]      Social History   Socioeconomic History   Marital status: Married    Spouse name: Anderson Malta   Number of children: 4   Years of education: Master's   Highest education level: Master's degree (e.g., MA, MS, MEng, MEd, MSW, MBA)  Occupational History   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  Patient is right handed.   Social Determinants of Health   Financial Resource Strain: Not on file  Food Insecurity: Not on file  Transportation Needs: Not on file  Physical Activity: Not on file  Stress: Not on file  Social Connections: Not on file  Intimate Partner Violence: Not on file    Family History  Problem Relation Age of Onset   Heart attack Father    Stroke Mother    Thyroid disease Mother    Breast cancer Sister    Thyroid disease Brother    Thyroid disease Sister    Thyroid disease Sister     ROS: no fevers or chills, productive cough, hemoptysis, dysphasia, odynophagia, melena, hematochezia, dysuria, hematuria, rash, seizure activity, orthopnea, PND, pedal edema, claudication. Remaining systems are negative.  Physical Exam: Well-developed  well-nourished in no acute distress.  Skin is warm and dry.  HEENT is normal.  Neck is supple.  Chest is clear to auscultation with normal expansion.  Cardiovascular exam is regular rate and rhythm.  Abdominal exam nontender or distended. No masses palpated. Extremities show no edema. neuro grossly intact  ECG- NSR, nonspecific ST changes; personally reviewed  A/P  1 coronary artery disease status post coronary artery bypass graft-patient doing well with no chest pain.  Continue aspirin and statin.  2 hyperlipidemia-continue statin. Last LDL 77; add zetia 10 mg daily; lipids and liver 12 weeks.  Kirk Ruths, MD

## 2020-10-27 ENCOUNTER — Ambulatory Visit (INDEPENDENT_AMBULATORY_CARE_PROVIDER_SITE_OTHER): Payer: Medicare Other | Admitting: Cardiology

## 2020-10-27 ENCOUNTER — Other Ambulatory Visit: Payer: Self-pay

## 2020-10-27 ENCOUNTER — Encounter: Payer: Self-pay | Admitting: Cardiology

## 2020-10-27 VITALS — BP 126/79 | HR 72 | Ht 70.0 in | Wt 207.4 lb

## 2020-10-27 DIAGNOSIS — E7800 Pure hypercholesterolemia, unspecified: Secondary | ICD-10-CM

## 2020-10-27 DIAGNOSIS — I2510 Atherosclerotic heart disease of native coronary artery without angina pectoris: Secondary | ICD-10-CM

## 2020-10-27 MED ORDER — EZETIMIBE 10 MG PO TABS: 10.0000 mg | ORAL_TABLET | Freq: Every day | ORAL | 3 refills | Status: DC

## 2020-10-27 NOTE — Patient Instructions (Signed)
Medication Instructions:   START EZETIMIBE 10 MG ONCE DAILY  *If you need a refill on your cardiac medications before your next appointment, please call your pharmacy*   Lab Work:  Your physician recommends that you return for lab work in: 3 MONTHS-FASTING  If you have labs (blood work) drawn today and your tests are completely normal, you will receive your results only by: [place] (if you have MyChart) OR A paper copy in the mail If you have any lab test that is abnormal or we need to change your treatment, we will call you to review the results.   Follow-Up: At [facility], you and your health needs are our priority.  As part of our continuing mission to provide you with exceptional heart care, we have created designated Provider Care Teams.  These Care Teams include your primary Cardiologist (physician) and Advanced Practice Providers (APPs -  Physician Assistants and Nurse Practitioners) who all work together to provide you with the care you need, when you need it.  We recommend signing up for the patient portal called "MyChart".  Sign up information is provided on this After Visit Summary.  MyChart is used to connect with patients for Virtual Visits (Telemedicine).  Patients are able to view lab/test results, encounter notes, upcoming appointments, etc.  Non-urgent messages can be sent to your provider as well.   To learn more about what you can do with MyChart, go to [email or web address].    Your next appointment:   12 month(s)  The format for your next appointment:   In Person  Provider:   Kirk Ruths, MD

## 2020-11-11 ENCOUNTER — Ambulatory Visit: Payer: Medicare Other | Admitting: Family Medicine

## 2020-11-24 DIAGNOSIS — M4322 Fusion of spine, cervical region: Secondary | ICD-10-CM | POA: Diagnosis not present

## 2020-11-24 DIAGNOSIS — M5116 Intervertebral disc disorders with radiculopathy, lumbar region: Secondary | ICD-10-CM | POA: Diagnosis not present

## 2020-11-24 DIAGNOSIS — G894 Chronic pain syndrome: Secondary | ICD-10-CM | POA: Diagnosis not present

## 2020-11-24 DIAGNOSIS — I10 Essential (primary) hypertension: Secondary | ICD-10-CM | POA: Diagnosis not present

## 2021-02-02 DIAGNOSIS — G894 Chronic pain syndrome: Secondary | ICD-10-CM | POA: Diagnosis not present

## 2021-02-02 DIAGNOSIS — M5116 Intervertebral disc disorders with radiculopathy, lumbar region: Secondary | ICD-10-CM | POA: Diagnosis not present

## 2021-02-02 DIAGNOSIS — I10 Essential (primary) hypertension: Secondary | ICD-10-CM | POA: Diagnosis not present

## 2021-02-02 DIAGNOSIS — M4322 Fusion of spine, cervical region: Secondary | ICD-10-CM | POA: Diagnosis not present

## 2021-02-03 ENCOUNTER — Encounter: Payer: Self-pay | Admitting: *Deleted

## 2021-02-17 LAB — LIPID PANEL
Cholesterol: 84 mg/dL (ref <200)
HDL: 37 mg/dL — ABNORMAL LOW (ref >=40)
LDL Cholesterol (Calc): 30 mg/dL (calc)
Non-HDL Cholesterol (Calc): 47 mg/dL (calc) (ref <130)
Total CHOL/HDL Ratio: 2.3 (calc) (ref <5.0)
Triglycerides: 83 mg/dL (ref <150)

## 2021-02-17 LAB — HEPATIC FUNCTION PANEL
AG Ratio: 2 (calc) (ref 1.0–2.5)
ALT: 46 U/L (ref 9–46)
AST: 40 U/L — ABNORMAL HIGH (ref 10–35)
Albumin: 4.5 g/dL (ref 3.6–5.1)
Alkaline phosphatase (APISO): 73 U/L (ref 35–144)
Bilirubin, Direct: 0.2 mg/dL (ref 0.0–0.2)
Globulin: 2.3 g/dL (calc) (ref 1.9–3.7)
Indirect Bilirubin: 0.6 mg/dL (calc) (ref 0.2–1.2)
Total Bilirubin: 0.8 mg/dL (ref 0.2–1.2)
Total Protein: 6.8 g/dL (ref 6.1–8.1)

## 2021-03-08 ENCOUNTER — Other Ambulatory Visit: Payer: Self-pay | Admitting: Family Medicine

## 2021-03-09 NOTE — Telephone Encounter (Signed)
Please call and schedule patient's follow-up appt. Past due since August.  Sending 30 day refill

## 2021-03-10 NOTE — Telephone Encounter (Signed)
Patient stated he is really sick at the moment and will call back to get this appointment scheduled. AM

## 2021-03-16 ENCOUNTER — Ambulatory Visit (INDEPENDENT_AMBULATORY_CARE_PROVIDER_SITE_OTHER): Payer: Medicare Other | Admitting: Family Medicine

## 2021-03-16 ENCOUNTER — Ambulatory Visit (INDEPENDENT_AMBULATORY_CARE_PROVIDER_SITE_OTHER): Payer: Medicare Other

## 2021-03-16 ENCOUNTER — Encounter: Payer: Self-pay | Admitting: Family Medicine

## 2021-03-16 ENCOUNTER — Other Ambulatory Visit: Payer: Self-pay

## 2021-03-16 VITALS — BP 128/83 | HR 74 | Temp 98.2°F | Ht 70.0 in | Wt 206.0 lb

## 2021-03-16 DIAGNOSIS — R053 Chronic cough: Secondary | ICD-10-CM

## 2021-03-16 DIAGNOSIS — Z0000 Encounter for general adult medical examination without abnormal findings: Secondary | ICD-10-CM | POA: Diagnosis not present

## 2021-03-16 DIAGNOSIS — J398 Other specified diseases of upper respiratory tract: Secondary | ICD-10-CM

## 2021-03-16 DIAGNOSIS — Z125 Encounter for screening for malignant neoplasm of prostate: Secondary | ICD-10-CM

## 2021-03-16 DIAGNOSIS — R052 Subacute cough: Secondary | ICD-10-CM

## 2021-03-16 DIAGNOSIS — Z1211 Encounter for screening for malignant neoplasm of colon: Secondary | ICD-10-CM | POA: Diagnosis not present

## 2021-03-16 MED ORDER — AMOXICILLIN-POT CLAVULANATE 875-125 MG PO TABS: 1.0000 | ORAL_TABLET | Freq: Two times a day (BID) | ORAL | 0 refills | Status: AC

## 2021-03-16 NOTE — Patient Instructions (Addendum)
Chest Xray today for your ongoing cough Will go ahead and treat cough and upper respiratory symptoms with Augmentin. If we need to make any adjustments after the xray is back, we will let you know.  Continue supportive measures including rest, hydration, humidifier use, steam showers, warm compresses to sinuses, warm liquids with lemon and honey, and over-the-counter cough, cold, and analgesics as needed.   Please contact office for sooner follow-up if symptoms do not improve or worsen. Seek emergency care if symptoms become severe.

## 2021-03-16 NOTE — Progress Notes (Signed)
BP 128/83 (BP Location: Left Arm, Patient Position: Sitting, Cuff Size: Normal)   Pulse 74   Temp 98.2 F (36.8 C) (Oral)   Ht 5\' 10"  (1.778 m)   Wt 206 lb 0.6 oz (93.5 kg)   SpO2 95%   BMI 29.56 kg/m    Subjective:    Patient ID: John Jefferson., male    DOB: 05/08/1955, 65 y.o.   MRN: [id number]  HPI: John Jefferson. is a 65 y.o. male presenting on 03/16/2021 for comprehensive medical examination. Current medical complaints include:   Patient reports he has had 5-6 weeks of chest congestion, "fluid" in his cough, clear sputum. Reports cough is ongoing throughout the day, but seems to be most congested first thing in the morning. He also reports some URI symptoms for almost just as long - sinus pressure/congestion, occasional fevers, mild wheezing at times. He denies any headaches, dyspnea, chest pain, body aches, nausea, vomiting, diarrhea, rashes, weight loss. He reports he smoked about 2 packs a day for a total of 10 years - quit in 1980.    He currently lives with: wife Interim Problems from his last visit: no  He reports regular vision exams q1-5y: yes He reports regular dental exams q 89m: yes His diet consists of: regular He endorses exercise and/or activity of: walking  He works at: retired, [employer]   He denies ETOH use  He denies nictoine use  He denies illegal substance use   He is currently sexually active  He denies concerns today about STI  He denies concerns about skin changes today:  He denies concerns about bowel changes today:  He denies concerns about bladder changes today:   Depression Screen done today and results listed below:  Depression screen [facility] 2/9 03/16/2021 05/14/2020 05/14/2019 11/06/2018 08/31/2016  Decreased Interest 0 0 3 0 0  Down, Depressed, Hopeless 0 0 0 1 0  PHQ - 2 Score 0 0 3 1 0  Altered sleeping - 2 3 - -  Tired, decreased energy - 1 3 - -  Change in appetite - 0 0 - -  Feeling bad or failure about yourself  - 0 0 - -  Trouble  concentrating - 1 3 - -  Moving slowly or fidgety/restless - 0 3 - -  Suicidal thoughts - 0 0 - -  PHQ-9 Score - 4 15 - -  Difficult doing work/chores - Not difficult at all Very difficult - -    The patient does not have a history of falls.     Past Medical History:  Past Medical History:  Diagnosis Date   Anxiety    CAD (coronary artery disease)    Cervical strain 11/21/2014   Cervicogenic headache 11/21/2014   Depression    Fibromyalgia    GERD (gastroesophageal reflux disease)    Gout 07/09/2014   Hyperlipidemia    Hypothyroidism 07/09/2014   Memory difficulty 02/17/2015   S/P CABG x 3 07/09/2014    Surgical History:  Past Surgical History:  Procedure Laterality Date   BACK SURGERY     CATARACT EXTRACTION Bilateral    CERVICAL FUSION     CORONARY ARTERY BYPASS GRAFT  March 2015   Performed in [place]      Medications:  Current Outpatient Medications on File Prior to Visit  Medication Sig   allopurinol (ZYLOPRIM) 300 MG tablet Take 1 tablet (300 mg total) by mouth  daily.   aspirin 81 MG tablet Take 1 tablet (81 mg total) by mouth daily.   atorvastatin (LIPITOR) 80 MG tablet TAKE 1 TABLET(80 MG) BY MOUTH DAILY   DULoxetine (CYMBALTA) 60 MG capsule TAKE 1 CAPSULE(60 MG) BY MOUTH DAILY   fentaNYL (DURAGESIC - DOSED MCG/HR) 25 MCG/HR patch UNW AND APP 1 PA TO SKIN Q 72 H. REMOVE OLD PATCHES B ADDING NEW PA   HYDROcodone-acetaminophen (NORCO/VICODIN) 5-325 MG tablet Take 1 tablet by mouth every 6 (six) hours as needed.   levothyroxine (SYNTHROID) 75 MCG tablet TAKE 1 TABLET(75 MCG) BY MOUTH DAILY BEFORE AND BREAKFAST   pantoprazole (PROTONIX) 40 MG tablet TAKE 1 TABLET(40 MG) BY MOUTH DAILY   polyethylene glycol powder (GLYCOLAX/MIRALAX) 17 GM/SCOOP powder Take 17 g by mouth 2 (two) times daily as needed.   topiramate (TOPAMAX) 100 MG tablet Take 100 mg by mouth 2 (two) times daily.   ezetimibe (ZETIA) 10 MG tablet Take 1 tablet  (10 mg total) by mouth daily.   gabapentin (NEURONTIN) 300 MG capsule Take 2 capsules (600 mg total) by mouth 3 (three) times daily.   No current facility-administered medications on file prior to visit.    Allergies:  No Known Allergies  Social History:  Social History   Socioeconomic History   Marital status: Married    Spouse name: Anderson Malta   Number of children: 4   Years of education: Master's   Highest education level: Master's degree (e.g., MA, MS, MEng, MEd, MSW, MBA)  Occupational History   Occupation: [employer]    Comment: Sales  Tobacco Use   Smoking status: Former    Types: Cigarettes    Quit date: 04/12/1985    Years since quitting: 35.9   Smokeless tobacco: Never  Vaping Use   Vaping Use: Never used  Substance and Sexual Activity   Alcohol use: No    Alcohol/week: 0.0 standard drinks   Drug use: No   Sexual activity: Not Currently    Partners: Female  Other Topics Concern   Not on file  Social History Narrative   Patient drinks 1 cup of caffeine daily.   Patient is right handed.   Social Determinants of Health   Financial Resource Strain: Not on file  Food Insecurity: Not on file  Transportation Needs: Not on file  Physical Activity: Not on file  Stress: Not on file  Social Connections: Not on file  Intimate Partner Violence: Not on file   Social History   Tobacco Use  Smoking Status Former   Types: Cigarettes   Quit date: 04/12/1985   Years since quitting: 35.9  Smokeless Tobacco Never   Social History   Substance and Sexual Activity  Alcohol Use No   Alcohol/week: 0.0 standard drinks    Family History:  Family History  Problem Relation Age of Onset   Heart attack Father    Stroke Mother    Thyroid disease Mother    Breast cancer Sister    Thyroid disease Brother    Thyroid disease Sister    Thyroid disease Sister     Past medical history, surgical history, medications, allergies, family history and social history reviewed with  patient today and changes made to appropriate areas of the chart.   All ROS negative except what is listed above and in the HPI.      Objective:    BP 128/83 (BP Location: Left Arm, Patient Position: Sitting, Cuff Size: Normal)   Pulse 74   Temp 98.2  F (36.8 C) (Oral)   Ht 5\' 10"  (1.778 m)   Wt 206 lb 0.6 oz (93.5 kg)   SpO2 95%   BMI 29.56 kg/m   Wt Readings from Last 3 Encounters:  03/16/21 206 lb 0.6 oz (93.5 kg)  10/27/20 207 lb 6.4 oz (94.1 kg)  05/14/20 203 lb (92.1 kg)    Physical Exam Vitals reviewed.  Constitutional:      Appearance: Normal appearance.  HENT:     Head: Normocephalic and atraumatic.     Right Ear: Tympanic membrane normal.     Left Ear: Tympanic membrane normal.     Nose: Nose normal.     Mouth/Throat:     Mouth: Mucous membranes are moist.     Pharynx: Oropharynx is clear. No oropharyngeal exudate or posterior oropharyngeal erythema.  Eyes:     Extraocular Movements: Extraocular movements intact.     Conjunctiva/sclera: Conjunctivae normal.  Cardiovascular:     Rate and Rhythm: Normal rate and regular rhythm.     Pulses: Normal pulses.     Heart sounds: Normal heart sounds.  Pulmonary:     Effort: Pulmonary effort is normal.     Breath sounds: Normal breath sounds.  Abdominal:     General: Abdomen is flat. Bowel sounds are normal.     Palpations: Abdomen is soft.  Musculoskeletal:        General: Normal range of motion.     Cervical back: Normal range of motion and neck supple.     Right lower leg: No edema.     Left lower leg: No edema.  Skin:    General: Skin is warm and dry.     Capillary Refill: Capillary refill takes less than 2 seconds.     Findings: No rash.  Neurological:     General: No focal deficit present.     Mental Status: He is alert and oriented to person, place, and time. Mental status is at baseline.  Psychiatric:        Mood and Affect: Mood normal.        Behavior: Behavior normal.        Thought Content:  Thought content normal.        Judgment: Judgment normal.    Results for orders placed or performed in visit on 10/27/20  Lipid panel  Result Value Ref Range   Cholesterol 84 <200 mg/dL   HDL 37 (L) > OR = 40 mg/dL   Triglycerides 83 <150 mg/dL   LDL Cholesterol (Calc) 30 mg/dL (calc)   Total CHOL/HDL Ratio 2.3 <5.0 (calc)   Non-HDL Cholesterol (Calc) 47 <130 mg/dL (calc)  Hepatic function panel  Result Value Ref Range   Total Protein 6.8 6.1 - 8.1 g/dL   Albumin 4.5 3.6 - 5.1 g/dL   Globulin 2.3 1.9 - 3.7 g/dL (calc)   AG Ratio 2.0 1.0 - 2.5 (calc)   Total Bilirubin 0.8 0.2 - 1.2 mg/dL   Bilirubin, Direct 0.2 0.0 - 0.2 mg/dL   Indirect Bilirubin 0.6 0.2 - 1.2 mg/dL (calc)   Alkaline phosphatase (APISO) 73 35 - 144 U/L   AST 40 (H) 10 - 35 U/L   ALT 46 9 - 46 U/L      Assessment & Plan:   Problem List Items Addressed This Visit   None Visit Diagnoses     Subacute cough    -  Primary   Relevant Medications   amoxicillin-clavulanate (AUGMENTIN) 875-125 MG tablet   Annual physical  exam       Relevant Orders   DG Chest 2 View   CBC with Differential/Platelet   Comprehensive metabolic panel   PSA, total and free   Ambulatory referral to Gastroenterology   Screen for colon cancer       Relevant Orders   Ambulatory referral to Gastroenterology   Screening PSA (prostate specific antigen)       Relevant Orders   PSA, total and free      For ongoing cough and URI symptoms, consider sinobronchitis. Will treat with Augmentin and go ahead and with chest xray given duration and smoking history.    LABORATORY TESTING:  Health maintenance labs ordered today as discussed above.  - STI testing: deferred  The natural history of prostate cancer and ongoing controversy regarding screening and potential treatment outcomes of prostate cancer has been discussed with the patient. The meaning of a false positive PSA and a false negative PSA has been discussed. He indicates  understanding of the limitations of this screening test and wishes to proceed with screening PSA testing.  IMMUNIZATIONS:   - Tdap: Tetanus vaccination status reviewed: last tetanus booster within 10 years. - Influenza: Up to date - Pneumovax: Not applicable - Prevnar: Given elsewhere - HPV: Not applicable - Shingrix vaccine: Up to date - COVID-19: Up to date  SCREENING: - Colonoscopy: Ordered today  Discussed with patient purpose of the colonoscopy is to detect colon cancer at curable precancerous or early stages  - AAA Screening: Not applicable  -Hearing Test: Not applicable  -Spirometry: Not applicable  - Lung Cancer Screening: Not applicable    PATIENT COUNSELING:    Sexuality: Discussed sexually transmitted diseases, partner selection, use of condoms, avoidance of unintended pregnancy, and contraceptive alternatives.    I discussed with the patient that most people either abstain from alcohol or drink within safe limits (<=14/week and <=4 drinks/occasion for males, <=7/weeks and <= 3 drinks/occasion for females) and that the risk for alcohol disorders and other health effects rises proportionally with the number of drinks per week and how often a drinker exceeds daily limits.  Discussed cessation/primary prevention of drug use and availability of treatment for abuse.   Diet: Encouraged to adjust caloric intake to maintain or achieve ideal body weight, to reduce intake of dietary saturated fat and total fat, to limit sodium intake by avoiding high sodium foods and not adding table salt, and to maintain adequate dietary potassium and calcium preferably from fresh fruits, vegetables, and low-fat dairy products.    Emphasized the importance of regular exercise  Injury prevention: Discussed safety belts, safety helmets, smoke detector, smoking near bedding or upholstery.   Dental health: Discussed importance of regular tooth brushing, flossing, and dental visits.   Follow up  plan:  Return in about 6 months (around 09/14/2021) for chronic disease management f/u.  Please contact office for sooner follow-up if symptoms do not improve or worsen. Seek emergency care if symptoms become severe.   Purcell Nails Olevia Bowens, DNP, FNP-C

## 2021-03-17 ENCOUNTER — Telehealth: Payer: Self-pay

## 2021-03-17 LAB — COMPREHENSIVE METABOLIC PANEL
AG Ratio: 1.7 (calc) (ref 1.0–2.5)
ALT: 45 U/L (ref 9–46)
AST: 36 U/L — ABNORMAL HIGH (ref 10–35)
Albumin: 4.3 g/dL (ref 3.6–5.1)
Alkaline phosphatase (APISO): 88 U/L (ref 35–144)
BUN: 20 mg/dL (ref 7–25)
CO2: 25 mmol/L (ref 20–32)
Calcium: 9.4 mg/dL (ref 8.6–10.3)
Chloride: 107 mmol/L (ref 98–110)
Creat: 0.92 mg/dL (ref 0.70–1.35)
Globulin: 2.5 g/dL (calc) (ref 1.9–3.7)
Glucose, Bld: 104 mg/dL (ref 65–139)
Potassium: 4.7 mmol/L (ref 3.5–5.3)
Sodium: 141 mmol/L (ref 135–146)
Total Bilirubin: 0.5 mg/dL (ref 0.2–1.2)
Total Protein: 6.8 g/dL (ref 6.1–8.1)

## 2021-03-17 LAB — CBC WITH DIFFERENTIAL/PLATELET
Absolute Monocytes: 1020 cells/uL — ABNORMAL HIGH (ref 200–950)
Basophils Absolute: 51 cells/uL (ref 0–200)
Basophils Relative: 0.5 %
Eosinophils Absolute: 384 cells/uL (ref 15–500)
Eosinophils Relative: 3.8 %
HCT: 44 % (ref 38.5–50.0)
Hemoglobin: 14.5 g/dL (ref 13.2–17.1)
Lymphs Abs: 2525 cells/uL (ref 850–3900)
MCH: 29.2 pg (ref 27.0–33.0)
MCHC: 33 g/dL (ref 32.0–36.0)
MCV: 88.5 fL (ref 80.0–100.0)
MPV: 11.2 fL (ref 7.5–12.5)
Monocytes Relative: 10.1 %
Neutro Abs: 6121 cells/uL (ref 1500–7800)
Neutrophils Relative %: 60.6 %
Platelets: 287 10*3/uL (ref 140–400)
RBC: 4.97 10*6/uL (ref 4.20–5.80)
RDW: 14.4 % (ref 11.0–15.0)
Total Lymphocyte: 25 %
WBC: 10.1 10*3/uL (ref 3.8–10.8)

## 2021-03-17 LAB — PSA, TOTAL AND FREE
PSA, % Free: 67 % (calc) (ref >25)
PSA, Free: 0.2 ng/mL
PSA, Total: 0.3 ng/mL (ref <=4.0)

## 2021-03-17 NOTE — Telephone Encounter (Signed)
Pt called requesting GI referral be changed to [name].   Sent to Referral Coordinator

## 2021-03-20 ENCOUNTER — Ambulatory Visit (INDEPENDENT_AMBULATORY_CARE_PROVIDER_SITE_OTHER): Payer: Medicare Other | Admitting: Physician Assistant

## 2021-03-20 DIAGNOSIS — Z1211 Encounter for screening for malignant neoplasm of colon: Secondary | ICD-10-CM | POA: Diagnosis not present

## 2021-03-20 DIAGNOSIS — Z0000 Encounter for general adult medical examination without abnormal findings: Secondary | ICD-10-CM

## 2021-03-20 NOTE — Patient Instructions (Addendum)
  [facility] Maintenance Summary and Written Plan of Care  [name] ,  Thank you for allowing me to perform your Medicare Annual Wellness Visit and for your ongoing commitment to your health.   Health Maintenance & Immunization History Health Maintenance  Topic Date Due   COVID-19 Vaccine (3 - Moderna risk series) 04/05/2021 (Originally 07/21/2019)   Zoster Vaccines- Shingrix (1 of 2) 06/18/2021 (Originally 12/05/1974)   COLONOSCOPY (Pts 45-60yrs Insurance coverage will need to be confirmed)  03/20/2022 (Originally 10/26/2020)   TETANUS/TDAP  03/17/2026   Pneumonia Vaccine 75+ Years old  Completed   INFLUENZA VACCINE  Completed   Hepatitis C Screening  Completed   HPV VACCINES  Aged Out   Immunization History  Administered Date(s) Administered   Influenza,inj,Quad PF,6+ Mos 12/30/2014, 03/17/2016, 03/15/2018, 02/06/2019   Influenza-Unspecified 02/11/2020, 01/24/2021   Moderna Sars-Covid-2 Vaccination 05/25/2019, 06/23/2019   PNEUMOCOCCAL CONJUGATE-20 01/24/2021   Tdap 03/17/2016   Zoster, Live 08/26/2015    These are the patient goals that we discussed:  Goals Addressed               This Visit's Progress     Patient Stated (pt-stated)        Loose 10 lbs.         This is a list of Health Maintenance Items that are overdue or due now: Shingrix vaccine  Colorectal cancer screening  Orders/Referrals Placed Today: Orders Placed This Encounter  Procedures   Ambulatory referral to Gastroenterology (for Colonoscopy)    Referral Priority:   Routine    Referral Type:   Consultation    Referral Reason:   Specialty Services Required    Number of Visits Requested:   1   (Contact our referral department at [phone] if you have not spoken with someone about your referral appointment within the next 5 days)    Follow-up Plan Follow-up with Luetta Nutting, DO as planned Colonoscopy referral has been sent.  Please schedule your shingrix  vaccine appointment at your pharmacy. Medicare wellness visit in one year. Patient will access AVS on mychart.

## 2021-03-20 NOTE — Progress Notes (Signed)
MEDICARE ANNUAL WELLNESS VISIT  03/20/2021  Telephone Visit Disclaimer This Medicare AWV was conducted by telephone due to national recommendations for restrictions regarding the COVID-19 Pandemic (e.g. social distancing).  I verified, using two identifiers, that I am speaking with John Jefferson. or their authorized healthcare agent. I discussed the limitations, risks, security, and privacy concerns of performing an evaluation and management service by telephone and the potential availability of an in-person appointment in the future. The patient expressed understanding and agreed to proceed.  Location of Patient: Home Location of Provider (nurse):  In the office.  Subjective:    John Jefferson. is a 65 y.o. male patient of John Nutting, DO who had a Medicare Annual Wellness Visit today via telephone. John Jefferson is Retired and lives with their spouse. he has 4 children. he reports that he is socially active and does interact with friends/family regularly. he is minimally physically active and enjoys remote sailing.  Patient Care Team: John Nutting, DO as PCP - General (Family Medicine) John Jefferson, Micro as Referring Physician (Chiropractic Medicine)  Advanced Directives 03/20/2021 05/14/2019 11/06/2018 12/02/2014 11/21/2014 10/08/2014 07/09/2014  Does Patient Have a Medical Advance Directive? Yes No Yes Yes Yes No Yes  Type of Advance Directive Living will;Healthcare Power of [place];Living will [place];Living will [place];Living will - [place];Living will;Out of facility DNR (pink MOST or yellow form)  Does patient want to make changes to medical advance directive? No - Patient declined - No - Patient declined - - - No - Patient declined  Copy of [place] in Chart? No - copy requested - No - copy requested No - copy requested - - -  Would patient like information on  creating a medical advance directive? - No - Patient declined - - - No - patient declined information -    Hospital Utilization Over the Past 12 Months: # of hospitalizations or ER visits: 0 # of surgeries: 0  Review of Systems    Patient reports that his overall health is unchanged compared to last year.  History obtained from chart review and the patient  Patient Reported Readings (BP, Pulse, CBG, Weight, etc) none  Pain Assessment Pain : No/denies pain     Current Medications & Allergies (verified) Allergies as of 03/20/2021   No Known Allergies      Medication List        Accurate as of March 20, 2021 11:22 AM. If you have any questions, ask your nurse or doctor.          allopurinol 300 MG tablet Commonly known as: ZYLOPRIM Take 1 tablet (300 mg total) by mouth daily.   amoxicillin-clavulanate 875-125 MG tablet Commonly known as: Augmentin Take 1 tablet by mouth 2 (two) times daily for 10 days.   aspirin 81 MG tablet Take 1 tablet (81 mg total) by mouth daily.   atorvastatin 80 MG tablet Commonly known as: LIPITOR TAKE 1 TABLET(80 MG) BY MOUTH DAILY   DULoxetine 60 MG capsule Commonly known as: CYMBALTA TAKE 1 CAPSULE(60 MG) BY MOUTH DAILY   ezetimibe 10 MG tablet Commonly known as: ZETIA Take 1 tablet (10 mg total) by mouth daily.   fentaNYL 25 MCG/HR Commonly known as: DURAGESIC UNW AND APP 1 PA TO SKIN Q 72 H. REMOVE OLD PATCHES B ADDING NEW PA   gabapentin 300 MG capsule Commonly known as: NEURONTIN Take 2 capsules (  600 mg total) by mouth 3 (three) times daily.   HYDROcodone-acetaminophen 5-325 MG tablet Commonly known as: NORCO/VICODIN Take 1 tablet by mouth every 6 (six) hours as needed.   levothyroxine 75 MCG tablet Commonly known as: SYNTHROID TAKE 1 TABLET(75 MCG) BY MOUTH DAILY BEFORE AND BREAKFAST   pantoprazole 40 MG tablet Commonly known as: PROTONIX TAKE 1 TABLET(40 MG) BY MOUTH DAILY   polyethylene glycol powder 17  GM/SCOOP powder Commonly known as: GLYCOLAX/MIRALAX Take 17 g by mouth 2 (two) times daily as needed.   topiramate 100 MG tablet Commonly known as: TOPAMAX Take 100 mg by mouth 2 (two) times daily.        History (reviewed): Past Medical History:  Diagnosis Date   Anxiety    CAD (coronary artery disease)    Cervical strain 11/21/2014   Cervicogenic headache 11/21/2014   Depression    Fibromyalgia    GERD (gastroesophageal reflux disease)    Gout 07/09/2014   Hyperlipidemia    Hypothyroidism 07/09/2014   Memory difficulty 02/17/2015   S/P CABG x 3 07/09/2014   Past Surgical History:  Procedure Laterality Date   BACK SURGERY     CATARACT EXTRACTION Bilateral    CERVICAL FUSION     CORONARY ARTERY BYPASS GRAFT  March 2015   Performed in [place]     Family History  Problem Relation Age of Onset   Heart attack Father    Stroke Mother    Thyroid disease Mother    Breast cancer Sister    Thyroid disease Brother    Thyroid disease Sister    Thyroid disease Sister    Social History   Socioeconomic History   Marital status: Married    Spouse name: John Jefferson   Number of children: 4   Years of education: Master's   Highest education level: Master's degree (e.g., MA, MS, MEng, MEd, MSW, MBA)  Occupational History   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  Tobacco Use   Smoking status: Former    Types: Cigarettes    Quit date: 04/12/1985    Years since quitting: 35.9   Smokeless tobacco: Never  Vaping Use   Vaping Use: Never used  Substance and Sexual Activity   Alcohol use: No    Alcohol/week: 0.0 standard drinks   Drug use: No   Sexual activity: Not Currently    Partners: Female  Other Topics Concern   Not on file  Social History Narrative   Lives with his wife and daughter and grandchildren. He enjoys remote control sailing.   Social Determinants of Health   Financial Resource Strain: Low  Risk    Difficulty of Paying Living Expenses: Not hard at all  Food Insecurity: No Food Insecurity   Worried About Charity fundraiser in the Last Year: Never true   [place] in the Last Year: Never true  Transportation Needs: No Transportation Needs   Lack of Transportation (Medical): No   Lack of Transportation (Non-Medical): No  Physical Activity: Inactive   Days of Exercise per Week: 0 days   Minutes of Exercise per Session: 0 min  Stress: No Stress Concern Present   Feeling of Stress : Not at all  Social Connections: Moderately Integrated   Frequency of Communication with Friends and Family: Twice a week   Frequency of Social Gatherings with Friends and Family: Once a week  Attends Religious Services: More than 4 times per year   Active Member of Clubs or Organizations: No   Attends Archivist Meetings: Never   Marital Status: Married    Activities of Daily Living In your present state of health, do you have any difficulty performing the following activities: 03/20/2021  Hearing? N  Vision? N  Difficulty concentrating or making decisions? Y  Comment sometimes.  Walking or climbing stairs? Y  Comment sometimes (chronic neck and back pain )  Dressing or bathing? N  Doing errands, shopping? N  Preparing Food and eating ? N  Using the Toilet? N  In the past six months, have you accidently leaked urine? N  Do you have problems with loss of bowel control? N  Managing your Medications? N  Managing your Finances? N  Housekeeping or managing your Housekeeping? N  Some recent data might be hidden    Patient Education/ Literacy How often do you need to have someone help you when you read instructions, pamphlets, or other written materials from your doctor or pharmacy?: 1 - Never What is the last grade level you completed in school?: Masters Degree  Exercise Current Exercise Habits: Home exercise routine, Type of exercise: walking, Time (Minutes): 20,  Frequency (Times/Week): 2, Weekly Exercise (Minutes/Week): 40, Intensity: Mild, Exercise limited by: orthopedic condition(s) (chronic back and neck pain.)  Diet Patient reports consuming 3 meals a day and 0 snack(s) a day Patient reports that his primary diet is: Regular Patient reports that she does have regular access to food.   Depression Screen PHQ 2/9 Scores 03/20/2021 03/16/2021 05/14/2020 05/14/2019 11/06/2018 08/31/2016 08/26/2015  PHQ - 2 Score 0 0 0 3 1 0 0  PHQ- 9 Score - - 4 15 - - -     Fall Risk Fall Risk  03/20/2021 03/16/2021 05/14/2020 11/06/2018 08/31/2016  Falls in the past year? 0 0 0 0 No  Number falls in past yr: 0 0 0 - -  Injury with Fall? 0 0 0 0 -  Risk for fall due to : No Fall Risks No Fall Risks No Fall Risks Impaired balance/gait;Impaired mobility -  Follow up Falls evaluation completed Falls evaluation completed Falls evaluation completed Falls prevention discussed -     Objective:  John Jefferson. seemed alert and oriented and he participated appropriately during our telephone visit.  Blood Pressure Weight BMI  BP Readings from Last 3 Encounters:  03/16/21 128/83  10/27/20 126/79  05/14/20 113/77   Wt Readings from Last 3 Encounters:  03/16/21 206 lb 0.6 oz (93.5 kg)  10/27/20 207 lb 6.4 oz (94.1 kg)  05/14/20 203 lb (92.1 kg)   BMI Readings from Last 1 Encounters:  03/16/21 29.56 kg/m    *Unable to obtain current vital signs, weight, and BMI due to telephone visit type  Hearing/Vision  Augustus did not seem to have difficulty with hearing/understanding during the telephone conversation Reports that he has not had a formal eye exam by an eye care professional within the past year Reports that he has not had a formal hearing evaluation within the past year *Unable to fully assess hearing and vision during telephone visit type  Cognitive Function: 6CIT Screen 03/20/2021 11/06/2018  What Year? 0 points 0 points  What month? 0 points 0 points  What time?  0 points 0 points  Count back from 20 0 points 0 points  Months in reverse 0 points 2 points  Repeat phrase 0 points 0 points  Total Score 0 2   (Normal:0-7, Significant for Dysfunction: >8)  Normal Cognitive Function Screening: Yes   Immunization & Health Maintenance Record Immunization History  Administered Date(s) Administered   Influenza,inj,Quad PF,6+ Mos 12/30/2014, 03/17/2016, 03/15/2018, 02/06/2019   Influenza-Unspecified 02/11/2020, 01/24/2021   Moderna Sars-Covid-2 Vaccination 05/25/2019, 06/23/2019   PNEUMOCOCCAL CONJUGATE-20 01/24/2021   Tdap 03/17/2016   Zoster, Live 08/26/2015    Health Maintenance  Topic Date Due   COVID-19 Vaccine (3 - Moderna risk series) 04/05/2021 (Originally 07/21/2019)   Zoster Vaccines- Shingrix (1 of 2) 06/18/2021 (Originally 12/05/1972)   COLONOSCOPY (Pts 45-84yrs Insurance coverage will need to be confirmed)  03/20/2022 (Originally 10/26/2020)   TETANUS/TDAP  03/17/2026   Pneumonia Vaccine 46+ Years old  Completed   INFLUENZA VACCINE  Completed   Hepatitis C Screening  Completed   HPV VACCINES  Aged Out       Assessment  This is a routine wellness examination for John Jefferson.Marland Kitchen  Health Maintenance: Due or Overdue There are no preventive care reminders to display for this patient.    John Jefferson. does not need a referral for Community Assistance: Care Management:   no Social Work:    no Prescription Assistance:  no Nutrition/Diabetes Education:  no   Plan:  Personalized Goals  Goals Addressed               This Visit's Progress     Patient Stated (pt-stated)        Loose 10 lbs.       Personalized Health Maintenance & Screening Recommendations  Shingrix vaccine Colorectal Cancer screening  Lung Cancer Screening Recommended: no (Low Dose CT Chest recommended if Age 99-80 years, 30 pack-year currently smoking OR have quit w/in past 15 years) Hepatitis C Screening recommended: no HIV Screening  recommended: no  Advanced Directives: Written information was not prepared per patient's request.  Referrals & Orders Orders Placed This Encounter  Procedures   Ambulatory referral to Gastroenterology (for Colonoscopy)     Follow-up Plan Follow-up with John Nutting, DO as planned Colonoscopy referral has been sent.  Please schedule your shingrix vaccine appointment at your pharmacy. Medicare wellness visit in one year. Patient will access AVS on mychart.   I have personally reviewed and noted the following in the patient's chart:   Medical and social history Use of alcohol, tobacco or illicit drugs  Current medications and supplements Functional ability and status Nutritional status Physical activity Advanced directives List of other physicians Hospitalizations, surgeries, and ER visits in previous 12 months Vitals Screenings to include cognitive, depression, and falls Referrals and appointments  In addition, I have reviewed and discussed with John Jefferson. certain preventive protocols, quality metrics, and best practice recommendations. A written personalized care plan for preventive services as well as general preventive health recommendations is available and can be mailed to the patient at his request.      Tinnie Gens, RN  03/20/2021

## 2021-06-15 DIAGNOSIS — M4322 Fusion of spine, cervical region: Secondary | ICD-10-CM | POA: Diagnosis not present

## 2021-06-15 DIAGNOSIS — M5116 Intervertebral disc disorders with radiculopathy, lumbar region: Secondary | ICD-10-CM | POA: Diagnosis not present

## 2021-06-15 DIAGNOSIS — M549 Dorsalgia, unspecified: Secondary | ICD-10-CM | POA: Diagnosis not present

## 2021-06-15 DIAGNOSIS — I10 Essential (primary) hypertension: Secondary | ICD-10-CM | POA: Diagnosis not present

## 2021-06-15 DIAGNOSIS — G894 Chronic pain syndrome: Secondary | ICD-10-CM | POA: Diagnosis not present

## 2021-06-17 DIAGNOSIS — Z09 Encounter for follow-up examination after completed treatment for conditions other than malignant neoplasm: Secondary | ICD-10-CM | POA: Diagnosis not present

## 2021-06-17 DIAGNOSIS — Z1211 Encounter for screening for malignant neoplasm of colon: Secondary | ICD-10-CM | POA: Diagnosis not present

## 2021-06-17 DIAGNOSIS — K2270 Barrett's esophagus without dysplasia: Secondary | ICD-10-CM | POA: Diagnosis not present

## 2021-06-17 DIAGNOSIS — I2510 Atherosclerotic heart disease of native coronary artery without angina pectoris: Secondary | ICD-10-CM | POA: Diagnosis not present

## 2021-06-17 DIAGNOSIS — K5730 Diverticulosis of large intestine without perforation or abscess without bleeding: Secondary | ICD-10-CM | POA: Diagnosis not present

## 2021-06-17 DIAGNOSIS — Z86010 Personal history of colonic polyps: Secondary | ICD-10-CM | POA: Diagnosis not present

## 2021-06-17 DIAGNOSIS — K2289 Other specified disease of esophagus: Secondary | ICD-10-CM | POA: Diagnosis not present

## 2021-06-30 ENCOUNTER — Other Ambulatory Visit: Payer: Self-pay | Admitting: Family Medicine

## 2021-06-30 ENCOUNTER — Other Ambulatory Visit: Payer: Self-pay

## 2021-06-30 MED ORDER — PANTOPRAZOLE SODIUM 40 MG PO TBEC: DELAYED_RELEASE_TABLET | ORAL | 0 refills | Status: DC

## 2021-07-01 ENCOUNTER — Other Ambulatory Visit: Payer: Self-pay

## 2021-07-01 DIAGNOSIS — E7800 Pure hypercholesterolemia, unspecified: Secondary | ICD-10-CM

## 2021-07-01 MED ORDER — ATORVASTATIN CALCIUM 80 MG PO TABS: ORAL_TABLET | ORAL | 3 refills | Status: DC

## 2021-07-07 ENCOUNTER — Other Ambulatory Visit: Payer: Self-pay | Admitting: Family Medicine

## 2021-07-09 ENCOUNTER — Ambulatory Visit (INDEPENDENT_AMBULATORY_CARE_PROVIDER_SITE_OTHER): Payer: Medicare Other | Admitting: Family Medicine

## 2021-07-09 ENCOUNTER — Encounter: Payer: Self-pay | Admitting: Family Medicine

## 2021-07-09 ENCOUNTER — Other Ambulatory Visit: Payer: Self-pay | Admitting: Family Medicine

## 2021-07-09 VITALS — BP 114/73 | HR 72 | Ht 70.0 in | Wt 208.0 lb

## 2021-07-09 DIAGNOSIS — I2510 Atherosclerotic heart disease of native coronary artery without angina pectoris: Secondary | ICD-10-CM

## 2021-07-09 DIAGNOSIS — E039 Hypothyroidism, unspecified: Secondary | ICD-10-CM | POA: Diagnosis not present

## 2021-07-09 DIAGNOSIS — R5383 Other fatigue: Secondary | ICD-10-CM

## 2021-07-09 DIAGNOSIS — E559 Vitamin D deficiency, unspecified: Secondary | ICD-10-CM | POA: Diagnosis not present

## 2021-07-09 DIAGNOSIS — J069 Acute upper respiratory infection, unspecified: Secondary | ICD-10-CM | POA: Diagnosis not present

## 2021-07-09 DIAGNOSIS — F321 Major depressive disorder, single episode, moderate: Secondary | ICD-10-CM

## 2021-07-09 MED ORDER — BUPROPION HCL ER (XL) 150 MG PO TB24: 150.0000 mg | ORAL_TABLET | Freq: Every day | ORAL | 3 refills | Status: DC

## 2021-07-09 MED ORDER — ONDANSETRON 4 MG PO TBDP: 4.0000 mg | ORAL_TABLET | Freq: Three times a day (TID) | ORAL | 3 refills | Status: DC | PRN

## 2021-07-09 NOTE — Patient Instructions (Signed)
We'll be in touch with lab results.  ?Let's try adding bupropion '150mg'$  daily.  ?See me again in 4 weeks.  ?

## 2021-07-09 NOTE — Assessment & Plan Note (Signed)
Resolving symptoms of URI.  He will let me know if symptoms worsen. ?

## 2021-07-09 NOTE — Assessment & Plan Note (Signed)
Currently already on duloxetine.  We will add bupropion in addition to this.  We did discuss possibly seeing a therapist however he would like to hold off on this for now. ?

## 2021-07-09 NOTE — Progress Notes (Signed)
?John Jefferson. - 66 y.o. male MRN [id number]  Date of birth: Sep 28, 1955 ? ?Subjective ?Chief Complaint  ?Patient presents with  ? Depression  ? URI  ? Ear Pain  ? ? ?HPI ?John Jefferson is a 66 year old male here today for follow-up visit.  He is accompanied by his wife.  Has concerns about his energy levels and reports that he spends a lot of time in bed.  He does admit to some depressive symptoms as well as decreased energy related to his chronic pain.  Reports that his daughter and her children are currently living with him.  He spends a lot of time in his room in an effort to avoid them.  He does report that they are planning on moving out soon and he feels that this situation will improve.  He is currently taking Cymbalta as part of his chronic pain regimen.  He does also report some chronic nausea.  May be related to his chronic opioid use.  He is taking Protonix for management of GERD and reports that the symptoms are well controlled.  He did have recent endoscopy which showed some dysplastic changes.  He has repeat endoscopy in 6 months. ? ?He has had recent cold symptoms nasal congestion, ear pressure and sensation of needing to clear his throat.  The symptoms are improving at this point.  Denies cough, wheezing or shortness of breath. ? ?ROS:  A comprehensive ROS was completed and negative except as noted per HPI ? ? ?No Known Allergies ? ?Past Medical History:  ?Diagnosis Date  ? Anxiety   ? CAD (coronary artery disease)   ? Cervical strain 11/21/2014  ? Cervicogenic headache 11/21/2014  ? Depression   ? Fibromyalgia   ? GERD (gastroesophageal reflux disease)   ? Gout 07/09/2014  ? Hyperlipidemia   ? Hypothyroidism 07/09/2014  ? Memory difficulty 02/17/2015  ? S/P CABG x 3 07/09/2014  ? ? ?Past Surgical History:  ?Procedure Laterality Date  ? BACK SURGERY    ? CATARACT EXTRACTION Bilateral   ? CERVICAL FUSION    ? CORONARY ARTERY BYPASS GRAFT  March 2015  ? Performed in [place]  ? KNEE SURGERY    ? ROTATOR CUFF  REPAIR    ? ? ?Social History  ? ?Socioeconomic History  ? Marital status: Married  ?  Spouse name: Anderson Malta  ? Number of children: 4  ? Years of education: Master's  ? Highest education level: Master's degree (e.g., MA, MS, MEng, MEd, MSW, MBA)  ?Occupational History  ? 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 Occupation: Retired.  ?Tobacco Use  ? Smoking status: Former  ?  Types: Cigarettes  ?  Quit date: 04/12/1985  ?  Years since quitting: 36.2  ? Smokeless tobacco: Never  ?Vaping Use  ? Vaping Use: Never used  ?Substance and Sexual Activity  ? Alcohol use: No  ?  Alcohol/week: 0.0 standard drinks  ? Drug use: No  ? Sexual activity: Not Currently  ?  Partners: Female  ?Other Topics Concern  ? Not on file  ?Social History Narrative  ? Lives with his wife and daughter and grandchildren. He enjoys remote control sailing.  ? ?Social Determinants of Health  ? ?Financial Resource Strain: Low Risk   ? Difficulty of Paying Living Expenses: Not hard at all  ?Food Insecurity: No Food Insecurity  ? Worried About Charity fundraiser in the Last Year: Never true  ? Ran Out of Food in the  Last Year: Never true  ?Transportation Needs: No Transportation Needs  ? Lack of Transportation (Medical): No  ? Lack of Transportation (Non-Medical): No  ?Physical Activity: Inactive  ? Days of Exercise per Week: 0 days  ? Minutes of Exercise per Session: 0 min  ?Stress: No Stress Concern Present  ? Feeling of Stress : Not at all  ?Social Connections: Moderately Integrated  ? Frequency of Communication with Friends and Family: Twice a week  ? Frequency of Social Gatherings with Friends and Family: Once a week  ? Attends Religious Services: More than 4 times per year  ? Active Member of Clubs or Organizations: No  ? Attends Archivist Meetings: Never  ? Marital Status: Married  ? ? ?Family History  ?Problem Relation Age of Onset  ? Heart attack Father   ? Stroke Mother   ? Thyroid disease Mother   ? Breast cancer Sister   ?  Thyroid disease Brother   ? Thyroid disease Sister   ? Thyroid disease Sister   ? ? ?Health Maintenance  ?Topic Date Due  ? COVID-19 Vaccine (3 - Moderna risk series) 12/11/2021 (Originally 07/21/2019)  ? Zoster Vaccines- Shingrix (1 of 2) 12/11/2021 (Originally 12/05/1972)  ? COLONOSCOPY (Pts 45-10yr Insurance coverage will need to be confirmed)  03/20/2022 (Originally 10/26/2020)  ? TETANUS/TDAP  03/17/2026  ? Pneumonia Vaccine 68 Years old  Completed  ? INFLUENZA VACCINE  Completed  ? Hepatitis C Screening  Completed  ? HPV VACCINES  Aged Out  ? ? ? ?----------------------------------------------------------------------------------------------------------------------------------------------------------------------------------------------------------------- ?Physical Exam ?BP 114/73 (BP Location: Left Arm, Patient Position: Sitting, Cuff Size: Normal)   Pulse 72   Ht '5\' 10"'$  (1.778 m)   Wt 208 lb (94.3 kg)   SpO2 96%   BMI 29.84 kg/m?  ? ?Physical Exam ?Constitutional:   ?   Appearance: Normal appearance.  ?Eyes:  ?   General: No scleral icterus. ?Cardiovascular:  ?   Rate and Rhythm: Normal rate and regular rhythm.  ?Pulmonary:  ?   Effort: Pulmonary effort is normal.  ?   Breath sounds: Normal breath sounds.  ?Musculoskeletal:  ?   Cervical back: Neck supple.  ?Neurological:  ?   Mental Status: He is alert.  ?Psychiatric:     ?   Mood and Affect: Mood normal.     ?   Behavior: Behavior normal.  ? ? ?------------------------------------------------------------------------------------------------------------------------------------------------------------------------------------------------------------------- ?Assessment and Plan ? ?Hypothyroidism ?He is having increased fatigue.  Updating TSH today. ? ?Depression, major, single episode, moderate (HClovis ?Currently already on duloxetine.  We will add bupropion in addition to this.  We did discuss possibly seeing a therapist however he would like to hold off on  this for now. ? ?URI (upper respiratory infection) ?Resolving symptoms of URI.  He will let me know if symptoms worsen. ? ? ?Meds ordered this encounter  ?Medications  ? buPROPion (WELLBUTRIN XL) 150 MG 24 hr tablet  ?  Sig: Take 1 tablet (150 mg total) by mouth daily.  ?  Dispense:  30 tablet  ?  Refill:  3  ? ondansetron (ZOFRAN-ODT) 4 MG disintegrating tablet  ?  Sig: Take 1 tablet (4 mg total) by mouth every 8 (eight) hours as needed for nausea or vomiting.  ?  Dispense:  30 tablet  ?  Refill:  3  ? ? ?Return in about 4 weeks (around 08/06/2021) for Fatigue/Mood. ? ? ? ?This visit occurred during the SARS-CoV-2 public health emergency.  Safety protocols were in place, including  screening questions prior to the visit, additional usage of staff PPE, and extensive cleaning of exam room while observing appropriate contact time as indicated for disinfecting solutions.  ? ? ?

## 2021-07-09 NOTE — Assessment & Plan Note (Signed)
He is having increased fatigue.  Updating TSH today. ?

## 2021-07-10 LAB — CBC WITH DIFFERENTIAL/PLATELET
Absolute Monocytes: 711 cells/uL (ref 200–950)
Basophils Absolute: 40 cells/uL (ref 0–200)
Basophils Relative: 0.5 %
Eosinophils Absolute: 458 cells/uL (ref 15–500)
Eosinophils Relative: 5.8 %
HCT: 46.1 % (ref 38.5–50.0)
Hemoglobin: 15.3 g/dL (ref 13.2–17.1)
Lymphs Abs: 2331 cells/uL (ref 850–3900)
MCH: 30.7 pg (ref 27.0–33.0)
MCHC: 33.2 g/dL (ref 32.0–36.0)
MCV: 92.4 fL (ref 80.0–100.0)
MPV: 10 fL (ref 7.5–12.5)
Monocytes Relative: 9 %
Neutro Abs: 4361 cells/uL (ref 1500–7800)
Neutrophils Relative %: 55.2 %
Platelets: 258 10*3/uL (ref 140–400)
RBC: 4.99 10*6/uL (ref 4.20–5.80)
RDW: 12.9 % (ref 11.0–15.0)
Total Lymphocyte: 29.5 %
WBC: 7.9 10*3/uL (ref 3.8–10.8)

## 2021-07-10 LAB — COMPLETE METABOLIC PANEL WITH GFR
AG Ratio: 2.1 (calc) (ref 1.0–2.5)
ALT: 49 U/L — ABNORMAL HIGH (ref 9–46)
AST: 33 U/L (ref 10–35)
Albumin: 4.6 g/dL (ref 3.6–5.1)
Alkaline phosphatase (APISO): 75 U/L (ref 35–144)
BUN: 16 mg/dL (ref 7–25)
CO2: 26 mmol/L (ref 20–32)
Calcium: 9.9 mg/dL (ref 8.6–10.3)
Chloride: 108 mmol/L (ref 98–110)
Creat: 0.97 mg/dL (ref 0.70–1.35)
Globulin: 2.2 g/dL (calc) (ref 1.9–3.7)
Glucose, Bld: 108 mg/dL — ABNORMAL HIGH (ref 65–99)
Potassium: 4.6 mmol/L (ref 3.5–5.3)
Sodium: 143 mmol/L (ref 135–146)
Total Bilirubin: 0.6 mg/dL (ref 0.2–1.2)
Total Protein: 6.8 g/dL (ref 6.1–8.1)
eGFR: 86 mL/min/{1.73_m2} (ref >=60)

## 2021-07-10 LAB — TSH: TSH: 5.08 mIU/L — ABNORMAL HIGH (ref 0.40–4.50)

## 2021-07-10 LAB — VITAMIN D 25 HYDROXY (VIT D DEFICIENCY, FRACTURES): Vit D, 25-Hydroxy: 34 ng/mL (ref 30–100)

## 2021-07-12 ENCOUNTER — Other Ambulatory Visit: Payer: Self-pay | Admitting: Family Medicine

## 2021-07-12 DIAGNOSIS — E039 Hypothyroidism, unspecified: Secondary | ICD-10-CM

## 2021-07-12 MED ORDER — LEVOTHYROXINE SODIUM 88 MCG PO TABS: ORAL_TABLET | ORAL | 1 refills | Status: DC

## 2021-08-12 ENCOUNTER — Ambulatory Visit (INDEPENDENT_AMBULATORY_CARE_PROVIDER_SITE_OTHER): Payer: Medicare Other | Admitting: Family Medicine

## 2021-08-12 ENCOUNTER — Encounter: Payer: Self-pay | Admitting: Family Medicine

## 2021-08-12 DIAGNOSIS — T402X5A Adverse effect of other opioids, initial encounter: Secondary | ICD-10-CM | POA: Diagnosis not present

## 2021-08-12 DIAGNOSIS — F321 Major depressive disorder, single episode, moderate: Secondary | ICD-10-CM

## 2021-08-12 DIAGNOSIS — E039 Hypothyroidism, unspecified: Secondary | ICD-10-CM

## 2021-08-12 DIAGNOSIS — K5903 Drug induced constipation: Secondary | ICD-10-CM | POA: Diagnosis not present

## 2021-08-12 MED ORDER — NALOXEGOL OXALATE 25 MG PO TABS: 25.0000 mg | ORAL_TABLET | Freq: Every day | ORAL | 1 refills | Status: DC

## 2021-08-12 MED ORDER — BUPROPION HCL ER (XL) 150 MG PO TB24: 150.0000 mg | ORAL_TABLET | Freq: Every day | ORAL | 1 refills | Status: DC

## 2021-08-12 MED ORDER — LEVOTHYROXINE SODIUM 88 MCG PO TABS: ORAL_TABLET | ORAL | 1 refills | Status: DC

## 2021-08-12 NOTE — Assessment & Plan Note (Signed)
Has not picked up 39mg dose.  Re-sent to pharmacy and recheck labs in about 6 weeks.  ?

## 2021-08-12 NOTE — Assessment & Plan Note (Signed)
Some improvement with bupropion.  Continue at current strength.  ?

## 2021-08-12 NOTE — Progress Notes (Signed)
?John Jefferson. - 66 y.o. male MRN [id number]  Date of birth: 1956/02/15 ? ?Subjective ?No chief complaint on file. ? ? ?HPI ?John Jefferson. Is a 66 y.o. male here today for follow up visit.  Had concerns about decreased energy and dysphoric mood.  Already on duloxetine and bupropion added.  TSH rechecked as well and was elevated.  Levothyroxine increased to 31mg.  Today he reports that he is doing a little better.  He never picked up the 826m levothyroxine.   ? ?He does continue to deal with constipation.  He is using dulcolax regularly to have bowel movements.  He remains on chronic opioids.   ? ? ? ?ROS:  A comprehensive ROS was completed and negative except as noted per HPI ? ?No Known Allergies ? ?Past Medical History:  ?Diagnosis Date  ? Anxiety   ? CAD (coronary artery disease)   ? Cervical strain 11/21/2014  ? Cervicogenic headache 11/21/2014  ? Depression   ? Fibromyalgia   ? GERD (gastroesophageal reflux disease)   ? Gout 07/09/2014  ? Hyperlipidemia   ? Hypothyroidism 07/09/2014  ? Memory difficulty 02/17/2015  ? S/P CABG x 3 07/09/2014  ? ? ?Past Surgical History:  ?Procedure Laterality Date  ? BACK SURGERY    ? CATARACT EXTRACTION Bilateral   ? CERVICAL FUSION    ? CORONARY ARTERY BYPASS GRAFT  March 2015  ? [place]formed [place]? KNEE SURGERY    ? ROTATOR CUFF REPAIR    ? ? ?Social History  ? ?Socioeconomic History  ? Marital status: Married  ?  Spouse name: JeAnderson Malta? Number of children: 4  ? Years of education: Master's  ? Highest education level: Master's degree (e.g., MA, MS, MEng, MEd, MSW, MBA)  ?Occupational History  ? 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 Occupation: Retired.  ?Tobacco Use  ? Smoking status: Former  ?  Types: Cigarettes  ?  Quit date: 04/12/1985  ?  Years since quitting: 36.3  ? Smokeless tobacco: Never  ?Vaping Use  ? Vaping Use: Never used  ?Substance and Sexual Activity  ? Alcohol use: No  ?  Alcohol/week: 0.0 standard drinks  ? Drug use: No  ? Sexual activity:  Not Currently  ?  Partners: Female  ?Other Topics Concern  ? Not on file  ?Social History Narrative  ? Lives with his wife and daughter and grandchildren. He enjoys remote control sailing.  ? ?Social Determinants of Health  ? ?Financial Resource Strain: Low Risk   ? Difficulty of Paying Living Expenses: Not hard at all  ?Food Insecurity: No Food Insecurity  ? Worried About RuCharity fundraisern the Last Year: Never true  ? Ran Out of Food in the Last Year: Never true  ?Transportation Needs: No Transportation Needs  ? Lack of Transportation (Medical): No  ? Lack of Transportation (Non-Medical): No  ?Physical Activity: Inactive  ? Days of Exercise per Week: 0 days  ? Minutes of Exercise per Session: 0 min  ?Stress: No Stress Concern Present  ? Feeling of Stress : Not at all  ?Social Connections: Moderately Integrated  ? Frequency of Communication with Friends and Family: Twice a week  ? Frequency of Social Gatherings with Friends and Family: Once a week  ? Attends Religious Services: More than 4 times per year  ? Active Member of Clubs or Organizations: No  ? Attends ClArchivisteetings: Never  ? Marital Status:  Married  ? ? ?Family History  ?Problem Relation Age of Onset  ? Heart attack Father   ? Stroke Mother   ? Thyroid disease Mother   ? Breast cancer Sister   ? Thyroid disease Brother   ? Thyroid disease Sister   ? Thyroid disease Sister   ? ? ?Health Maintenance  ?Topic Date Due  ? COVID-19 Vaccine (3 - Moderna risk series) 12/11/2021 (Originally 07/21/2019)  ? Zoster Vaccines- Shingrix (1 of 2) 12/11/2021 (Originally 12/05/1972)  ? COLONOSCOPY (Pts 45-68yr Insurance coverage will need to be confirmed)  03/20/2022 (Originally 10/26/2020)  ? INFLUENZA VACCINE  11/10/2021  ? TETANUS/TDAP  03/17/2026  ? Pneumonia Vaccine 68 Years old  Completed  ? Hepatitis C Screening  Completed  ? HPV VACCINES  Aged Out   ? ? ? ?----------------------------------------------------------------------------------------------------------------------------------------------------------------------------------------------------------------- ?Physical Exam ?BP 120/76 (BP Location: Left Arm, Patient Position: Sitting, Cuff Size: Normal)   Pulse 72   Ht '5\' 10"'$  (1.778 m)   Wt 208 lb (94.3 kg)   SpO2 94%   BMI 29.84 kg/m?  ? ?Physical Exam ?Constitutional:   ?   Appearance: Normal appearance.  ?Eyes:  ?   General: No scleral icterus. ?Cardiovascular:  ?   Rate and Rhythm: Normal rate and regular rhythm.  ?Pulmonary:  ?   Effort: Pulmonary effort is normal.  ?   Breath sounds: Normal breath sounds.  ?Musculoskeletal:  ?   Cervical back: Neck supple.  ?Neurological:  ?   Mental Status: He is alert.  ?Psychiatric:     ?   Mood and Affect: Mood normal.     ?   Behavior: Behavior normal.  ? ? ?------------------------------------------------------------------------------------------------------------------------------------------------------------------------------------------------------------------- ?Assessment and Plan ? ?Therapeutic opioid-induced constipation (OIC) ?Having increased difficulty with bowel movements.  Discussed trying to be more active, increased fluids and will add Movantik.   ? ?Hypothyroidism ?Has not picked up 853m dose.  Re-sent to pharmacy and recheck labs in about 6 weeks.  ? ?Depression, major, single episode, moderate ([place]?Some improvement with bupropion.  Continue at current strength.  ? ? ?No orders of the defined types were placed in this encounter. ? ? ?No follow-ups on file. ? ? ? ?This visit occurred during the SARS-CoV-2 public health emergency.  Safety protocols were in place, including screening questions prior to the visit, additional usage of staff PPE, and extensive cleaning of exam room while observing appropriate contact time as indicated for disinfecting solutions.  ? ?

## 2021-08-12 NOTE — Assessment & Plan Note (Signed)
Having increased difficulty with bowel movements.  Discussed trying to be more active, increased fluids and will add Movantik.   ?

## 2021-08-12 NOTE — Patient Instructions (Addendum)
Try movantik for constipation.  Be sure to drink plenty of fluid each day.   ?Have labs completed in 4-6 weeks.  ?

## 2021-08-17 DIAGNOSIS — I10 Essential (primary) hypertension: Secondary | ICD-10-CM | POA: Diagnosis not present

## 2021-08-17 DIAGNOSIS — M4322 Fusion of spine, cervical region: Secondary | ICD-10-CM | POA: Diagnosis not present

## 2021-08-17 DIAGNOSIS — M5116 Intervertebral disc disorders with radiculopathy, lumbar region: Secondary | ICD-10-CM | POA: Diagnosis not present

## 2021-08-18 ENCOUNTER — Telehealth: Payer: Self-pay

## 2021-08-18 DIAGNOSIS — E039 Hypothyroidism, unspecified: Secondary | ICD-10-CM

## 2021-08-18 NOTE — Telephone Encounter (Signed)
Pt lvm requesting referral to Endocrinology: John Jefferson at [facility] location.  ?

## 2021-09-14 ENCOUNTER — Ambulatory Visit: Payer: Medicare Other | Admitting: Family Medicine

## 2021-09-22 DIAGNOSIS — K449 Diaphragmatic hernia without obstruction or gangrene: Secondary | ICD-10-CM | POA: Diagnosis not present

## 2021-09-22 DIAGNOSIS — K2270 Barrett's esophagus without dysplasia: Secondary | ICD-10-CM | POA: Diagnosis not present

## 2021-09-22 DIAGNOSIS — I2510 Atherosclerotic heart disease of native coronary artery without angina pectoris: Secondary | ICD-10-CM | POA: Diagnosis not present

## 2021-09-23 ENCOUNTER — Other Ambulatory Visit: Payer: Self-pay

## 2021-09-23 MED ORDER — DULOXETINE HCL 60 MG PO CPEP: ORAL_CAPSULE | ORAL | 1 refills | Status: DC

## 2021-10-16 DIAGNOSIS — I10 Essential (primary) hypertension: Secondary | ICD-10-CM | POA: Diagnosis not present

## 2021-10-16 DIAGNOSIS — M5116 Intervertebral disc disorders with radiculopathy, lumbar region: Secondary | ICD-10-CM | POA: Diagnosis not present

## 2021-10-16 DIAGNOSIS — Z79891 Long term (current) use of opiate analgesic: Secondary | ICD-10-CM | POA: Diagnosis not present

## 2021-10-16 DIAGNOSIS — Z79899 Other long term (current) drug therapy: Secondary | ICD-10-CM | POA: Diagnosis not present

## 2021-10-16 DIAGNOSIS — G894 Chronic pain syndrome: Secondary | ICD-10-CM | POA: Diagnosis not present

## 2021-11-12 ENCOUNTER — Ambulatory Visit: Payer: Medicare Other | Admitting: Family Medicine

## 2021-11-12 DIAGNOSIS — I2510 Atherosclerotic heart disease of native coronary artery without angina pectoris: Secondary | ICD-10-CM | POA: Diagnosis not present

## 2021-11-12 DIAGNOSIS — R5383 Other fatigue: Secondary | ICD-10-CM | POA: Diagnosis not present

## 2021-11-12 DIAGNOSIS — E039 Hypothyroidism, unspecified: Secondary | ICD-10-CM | POA: Diagnosis not present

## 2021-11-12 DIAGNOSIS — E559 Vitamin D deficiency, unspecified: Secondary | ICD-10-CM | POA: Diagnosis not present

## 2021-11-17 LAB — CBC WITH DIFFERENTIAL/PLATELET
Absolute Monocytes: 972 cells/uL — ABNORMAL HIGH (ref 200–950)
Basophils Absolute: 36 cells/uL (ref 0–200)
Basophils Relative: 0.4 %
Eosinophils Absolute: 468 cells/uL (ref 15–500)
Eosinophils Relative: 5.2 %
HCT: 44.8 % (ref 38.5–50.0)
Hemoglobin: 14.9 g/dL (ref 13.2–17.1)
Lymphs Abs: 2700 cells/uL (ref 850–3900)
MCH: 30.2 pg (ref 27.0–33.0)
MCHC: 33.3 g/dL (ref 32.0–36.0)
MCV: 90.7 fL (ref 80.0–100.0)
MPV: 10 fL (ref 7.5–12.5)
Monocytes Relative: 10.8 %
Neutro Abs: 4824 cells/uL (ref 1500–7800)
Neutrophils Relative %: 53.6 %
Platelets: 206 10*3/uL (ref 140–400)
RBC: 4.94 10*6/uL (ref 4.20–5.80)
RDW: 13.3 % (ref 11.0–15.0)
Total Lymphocyte: 30 %
WBC: 9 10*3/uL (ref 3.8–10.8)

## 2021-11-17 LAB — COMPLETE METABOLIC PANEL WITH GFR
AG Ratio: 1.8 (calc) (ref 1.0–2.5)
ALT: 32 U/L (ref 9–46)
AST: 33 U/L (ref 10–35)
Albumin: 4.5 g/dL (ref 3.6–5.1)
Alkaline phosphatase (APISO): 85 U/L (ref 35–144)
BUN: 15 mg/dL (ref 7–25)
CO2: 25 mmol/L (ref 20–32)
Calcium: 9.7 mg/dL (ref 8.6–10.3)
Chloride: 106 mmol/L (ref 98–110)
Creat: 1.16 mg/dL (ref 0.70–1.35)
Globulin: 2.5 g/dL (calc) (ref 1.9–3.7)
Glucose, Bld: 95 mg/dL (ref 65–99)
Potassium: 4.9 mmol/L (ref 3.5–5.3)
Sodium: 142 mmol/L (ref 135–146)
Total Bilirubin: 0.7 mg/dL (ref 0.2–1.2)
Total Protein: 7 g/dL (ref 6.1–8.1)
eGFR: 69 mL/min/{1.73_m2} (ref >=60)

## 2021-11-17 LAB — TSH: TSH: 9.11 mIU/L — ABNORMAL HIGH (ref 0.40–4.50)

## 2021-11-17 LAB — VITAMIN D 25 HYDROXY (VIT D DEFICIENCY, FRACTURES): Vit D, 25-Hydroxy: 39 ng/mL (ref 30–100)

## 2021-11-17 LAB — T4, FREE: Free T4: 0.9 ng/dL (ref 0.8–1.8)

## 2021-11-19 ENCOUNTER — Encounter: Payer: Self-pay | Admitting: Family Medicine

## 2021-11-19 ENCOUNTER — Ambulatory Visit (INDEPENDENT_AMBULATORY_CARE_PROVIDER_SITE_OTHER): Payer: Medicare Other | Admitting: Family Medicine

## 2021-11-19 VITALS — BP 111/70 | HR 77 | Ht 70.0 in | Wt 210.0 lb

## 2021-11-19 DIAGNOSIS — K5903 Drug induced constipation: Secondary | ICD-10-CM | POA: Diagnosis not present

## 2021-11-19 DIAGNOSIS — E039 Hypothyroidism, unspecified: Secondary | ICD-10-CM | POA: Diagnosis not present

## 2021-11-19 DIAGNOSIS — R5382 Chronic fatigue, unspecified: Secondary | ICD-10-CM | POA: Diagnosis not present

## 2021-11-19 DIAGNOSIS — T402X5A Adverse effect of other opioids, initial encounter: Secondary | ICD-10-CM | POA: Diagnosis not present

## 2021-11-19 NOTE — Assessment & Plan Note (Signed)
Stable with Colace and MiraLAX.  He will continue this as needed.

## 2021-11-19 NOTE — Assessment & Plan Note (Signed)
He has done well with adjustment of his levothyroxine.  Updating thyroid function test.

## 2021-11-19 NOTE — Progress Notes (Signed)
John Jefferson. - 66 y.o. male MRN [id number]  Date of birth: [birth year]  Subjective No chief complaint on file.   HPI John Jefferson. is a 66 year old male here today for follow-up visit.  Levothyroxine was adjusted previously reports that energy levels have improved some.  Additionally bupropion seems to be helping some as well.  He has not had any side effects from medication.  Unable to get Movantik to treat his OIC due to cost of medication.  He has been using MiraLAX and Colace with improvement.  ROS:  A comprehensive ROS was completed and negative except as noted per HPI   No Known Allergies  Past Medical History:  Diagnosis Date   Anxiety    CAD (coronary artery disease)    Cervical strain 11/21/2014   Cervicogenic headache 11/21/2014   Depression    Fibromyalgia    GERD (gastroesophageal reflux disease)    Gout 07/09/2014   Hyperlipidemia    Hypothyroidism 07/09/2014   Memory difficulty 02/17/2015   S/P CABG x 3 07/09/2014    Past Surgical History:  Procedure Laterality Date   BACK SURGERY     CATARACT EXTRACTION Bilateral    CERVICAL FUSION     CORONARY ARTERY BYPASS GRAFT  March 2015   Performed in [place]      Social History   Socioeconomic History   Marital status: Married    Spouse name: Anderson Malta   Number of children: 4   Years of education: Master's   Highest education level: Master's degree (e.g., MA, MS, MEng, MEd, MSW, MBA)  Occupational History   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  Tobacco Use   Smoking status: Former    Types: Cigarettes    Quit date: 04/12/1985    Years since quitting: 36.6   Smokeless tobacco: Never  Vaping Use   Vaping Use: Never used  Substance and Sexual Activity   Alcohol use: No    Alcohol/week: 0.0 standard drinks of alcohol   Drug use: No   Sexual activity: Not Currently    Partners: Female  Other Topics Concern   Not on file   Social History Narrative   Lives with his wife and daughter and grandchildren. He enjoys remote control sailing.   Social Determinants of Health   Financial Resource Strain: Low Risk  (03/20/2021)   Overall Financial Resource Strain (CARDIA)    Difficulty of Paying Living Expenses: Not hard at all  Food Insecurity: No Food Insecurity (03/20/2021)   Hunger Vital Sign    Worried About Running Out of Food in the Last Year: Never true    Ran Out of Food in the Last Year: Never true  Transportation Needs: No Transportation Needs (03/20/2021)   PRAPARE - Hydrologist (Medical): No    Lack of Transportation (Non-Medical): No  Physical Activity: Inactive (03/20/2021)   Exercise Vital Sign    Days of Exercise per Week: 0 days    Minutes of Exercise per Session: 0 min  Stress: No Stress Concern Present (03/20/2021)   [place]    Feeling of Stress : Not at all  Social Connections: Moderately Integrated (03/20/2021)   Social Connection and Isolation Panel [NHANES]    Frequency of Communication with Friends and Family: Twice a week    Frequency of Social Gatherings with Friends and  Family: Once a week    Attends Religious Services: More than 4 times per year    Active Member of Clubs or Organizations: No    Attends Archivist Meetings: Never    Marital Status: Married    Family History  Problem Relation Age of Onset   Heart attack Father    Stroke Mother    Thyroid disease Mother    Breast cancer Sister    Thyroid disease Brother    Thyroid disease Sister    Thyroid disease Sister     Health Maintenance  Topic Date Due   COVID-19 Vaccine (3 - Moderna risk series) 12/11/2021 (Originally 07/21/2019)   Zoster Vaccines- Shingrix (1 of 2) 12/11/2021 (Originally 12/05/1972)   COLONOSCOPY (Pts 45-70yr Insurance coverage will need to be confirmed)  03/20/2022 (Originally 10/26/2020)    INFLUENZA VACCINE  07/11/2022 (Originally 11/10/2021)   TETANUS/TDAP  03/17/2026   Pneumonia Vaccine 66 Years old  Completed   Hepatitis C Screening  Completed   HPV VACCINES  Aged Out     ----------------------------------------------------------------------------------------------------------------------------------------------------------------------------------------------------------------- Physical Exam BP 111/70 (BP Location: Left Arm, Patient Position: Sitting, Cuff Size: Large)   Pulse 77   Ht '5\' 10"'$  (1.778 m)   Wt 210 lb (95.3 kg)   SpO2 96%   BMI 30.13 kg/m   Physical Exam Constitutional:      Appearance: Normal appearance.  Neurological:     Mental Status: He is alert.  Psychiatric:        Mood and Affect: Mood normal.        Behavior: Behavior normal.     ------------------------------------------------------------------------------------------------------------------------------------------------------------------------------------------------------------------- Assessment and Plan  Therapeutic opioid-induced constipation (OIC) Stable with Colace and MiraLAX.  He will continue this as needed.  Hypothyroidism He has done well with adjustment of his levothyroxine.  Updating thyroid function test.  Chronic fatigue He has had some improvement with bupropion and changes to levothyroxine.  I will check his testosterone levels given his chronic opioid use as well   No orders of the defined types were placed in this encounter.   No follow-ups on file.    This visit occurred during the SARS-CoV-2 public health emergency.  Safety protocols were in place, including screening questions prior to the visit, additional usage of staff PPE, and extensive cleaning of exam room while observing appropriate contact time as indicated for disinfecting solutions.

## 2021-11-19 NOTE — Assessment & Plan Note (Signed)
He has had some improvement with bupropion and changes to levothyroxine.  I will check his testosterone levels given his chronic opioid use as well

## 2021-11-20 LAB — TESTOSTERONE: Testosterone: 306 ng/dL (ref 250–827)

## 2021-11-20 LAB — TSH: TSH: 6.41 mIU/L — ABNORMAL HIGH (ref 0.40–4.50)

## 2021-11-20 LAB — T4, FREE: Free T4: 1 ng/dL (ref 0.8–1.8)

## 2021-11-26 ENCOUNTER — Other Ambulatory Visit: Payer: Self-pay | Admitting: Family Medicine

## 2021-11-26 DIAGNOSIS — E039 Hypothyroidism, unspecified: Secondary | ICD-10-CM

## 2021-11-26 MED ORDER — LEVOTHYROXINE SODIUM 100 MCG PO TABS: ORAL_TABLET | ORAL | 1 refills | Status: DC

## 2021-12-18 ENCOUNTER — Encounter: Payer: Self-pay | Admitting: Internal Medicine

## 2021-12-18 ENCOUNTER — Ambulatory Visit: Payer: Medicare Other | Admitting: Internal Medicine

## 2021-12-18 VITALS — BP 118/74 | HR 73 | Ht 70.0 in | Wt 207.4 lb

## 2021-12-18 DIAGNOSIS — R531 Weakness: Secondary | ICD-10-CM

## 2021-12-18 DIAGNOSIS — E039 Hypothyroidism, unspecified: Secondary | ICD-10-CM

## 2021-12-18 LAB — CORTISOL: Cortisol, Plasma: 13.7 ug/dL

## 2021-12-18 LAB — T4, FREE: Free T4: 0.82 ng/dL (ref 0.60–1.60)

## 2021-12-18 LAB — TSH: TSH: 0.85 u[IU]/mL (ref 0.35–5.50)

## 2021-12-18 MED ORDER — LEVOTHYROXINE SODIUM 100 MCG PO TABS
ORAL_TABLET | ORAL | 2 refills | Status: DC
Start: 2021-12-18 — End: 2022-04-27

## 2021-12-18 NOTE — Progress Notes (Signed)
Name: John Jefferson.  MRN/ DOB: [id number], [birth year]    Age/ Sex: 66 y.o., male    PCP: Luetta Nutting, DO   Reason for Endocrinology Evaluation: Hypothyroidism     Date of Initial Endocrinology Evaluation: 12/18/2021     HPI: John Jefferson. is a 66 y.o. male with a past medical history of Hypothyroidism, dyslipidemia . The patient presented for initial endocrinology clinic visit on 12/18/2021 for consultative assistance with his Hypothyroidism.   Patient was diagnosed with hypothyroidism in March 2023 with an elevated TSH, max level 9.11 u IU/mL in August 2023. He was started on LT-4 replacement at the time   He is accompanied  by his wife today   His main issue is extreme fatigue and his inability to get out of bed Of note the patient has chronic pain and is on chronic opiate which has been attributed to his chronic constipation He denies depression per se, but there has been some social issues at some point which made his symptoms worse  No prior neck radiation  Denies local neck symptoms Weight steady  Last week had nausea and dizziness for 3 days  No recent intra-articular injection , no oral prednisone  Strong FH of thyroid disease (mother and sisters )  Levothyroxine 100 mcg daily   HISTORY:  Past Medical History:  Past Medical History:  Diagnosis Date   Anxiety    CAD (coronary artery disease)    Cervical strain 11/21/2014   Cervicogenic headache 11/21/2014   Depression    Fibromyalgia    GERD (gastroesophageal reflux disease)    Gout 07/09/2014   Hyperlipidemia    Hypothyroidism 07/09/2014   Memory difficulty 02/17/2015   S/P CABG x 3 07/09/2014   Past Surgical History:  Past Surgical History:  Procedure Laterality Date   BACK SURGERY     CATARACT EXTRACTION Bilateral    CERVICAL FUSION     CORONARY ARTERY BYPASS GRAFT  March 2015   Performed in [place]      Social History:  reports that he quit  smoking about 36 years ago. His smoking use included cigarettes. He has never used smokeless tobacco. He reports that he does not drink alcohol and does not use drugs. Family History: family history includes Breast cancer in his sister; Heart attack in his father; Stroke in his mother; Thyroid disease in his brother, mother, sister, and sister.   HOME MEDICATIONS: Allergies as of 12/18/2021   No Known Allergies      Medication List        Accurate as of December 18, 2021 10:07 AM. If you have any questions, ask your nurse or doctor.          allopurinol 300 MG tablet Commonly known as: ZYLOPRIM TAKE 1 TABLET(300 MG) BY MOUTH DAILY   aspirin 81 MG tablet Take 1 tablet (81 mg total) by mouth daily.   atorvastatin 80 MG tablet Commonly known as: LIPITOR TAKE 1 TABLET(80 MG) BY MOUTH DAILY   buPROPion 150 MG 24 hr tablet Commonly known as: Wellbutrin XL Take 1 tablet (150 mg total) by mouth daily.   DULoxetine 60 MG capsule Commonly known as: CYMBALTA TAKE 1 CAPSULE(60 MG) BY MOUTH DAILY   ezetimibe 10 MG tablet Commonly known as: ZETIA Take 1 tablet (10 mg total) by mouth daily.   fentaNYL 25 MCG/HR Commonly known as: DURAGESIC UNW AND APP 1 PA  TO SKIN Q 72 H. REMOVE OLD PATCHES B ADDING NEW PA   gabapentin 300 MG capsule Commonly known as: NEURONTIN TAKE 2 CAPSULES(600 MG) BY MOUTH THREE TIMES DAILY   HYDROcodone-acetaminophen 5-325 MG tablet Commonly known as: NORCO/VICODIN Take 1 tablet by mouth every 6 (six) hours as needed.   levothyroxine 100 MCG tablet Commonly known as: SYNTHROID TAKE 1 TABLET BY MOUTH DAILY BEFORE BREAKFAST   ondansetron 4 MG disintegrating tablet Commonly known as: ZOFRAN-ODT Take 1 tablet (4 mg total) by mouth every 8 (eight) hours as needed for nausea or vomiting.   pantoprazole 40 MG tablet Commonly known as: PROTONIX TAKE 1 TABLET(40 MG) BY MOUTH DAILY   polyethylene glycol powder 17 GM/SCOOP powder Commonly known as:  GLYCOLAX/MIRALAX Take 17 g by mouth 2 (two) times daily as needed.   topiramate 100 MG tablet Commonly known as: TOPAMAX Take 100 mg by mouth 2 (two) times daily.          REVIEW OF SYSTEMS: A comprehensive ROS was conducted with the patient and is negative except as per HPI    OBJECTIVE:  VS: BP 118/74 (BP Location: Left Arm, Patient Position: Sitting, Cuff Size: Small)   Pulse 73   Ht '5\' 10"'$  (1.778 m)   Wt 207 lb 6.4 oz (94.1 kg)   SpO2 93%   BMI 29.76 kg/m    Wt Readings from Last 3 Encounters:  12/18/21 207 lb 6.4 oz (94.1 kg)  11/19/21 210 lb (95.3 kg)  08/12/21 208 lb (94.3 kg)     EXAM: General: Pt appears well and is in NAD Patient with vitiligo  Neck: General: Supple without adenopathy. Thyroid: Thyroid size normal.  No goiter or nodules appreciated.   Lungs: Clear with good BS bilat with no rales, rhonchi, or wheezes  Heart: Auscultation: RRR.  Abdomen: Normoactive bowel sounds, soft, nontender, without masses or organomegaly palpable  Extremities:  BL LE: No pretibial edema normal ROM and strength.  Mental Status: Judgment, insight: Intact Orientation: Oriented to time, place, and person Mood and affect: No depression, anxiety, or agitation     DATA REVIEWED:  Latest Reference Range & Units 12/18/21 10:13  Cortisol, Plasma ug/dL 13.7  TSH 0.35 - 5.50 uIU/mL 0.85  T4,Free(Direct) 0.60 - 1.60 ng/dL 0.82      ASSESSMENT/PLAN/RECOMMENDATIONS:   Hypothyroidism:  -Patient with multiple nonspecific symptoms -No local neck symptoms - Pt educated extensively on the correct way to take levothyroxine (first thing in the morning with water, 30 minutes before eating or taking other medications). - Pt encouraged to double dose the following day if she were to miss a dose given long half-life of levothyroxine. -TSH has normalized  Medications : Continue levothyroxine 100 mcg daily  2.Asthenia :  -This is chronic in nature, multifactorial but his  thyroid numbers do not support such extreme fatigue and weakness.  Could also be related to underlying depression as well as chronic pain syndrome and chronic opiate intake -I will proceed with checking cortisol as well as ACTH levels -Cortisol is normal   Follow-up in 6 months Labs in 2 months  Signed electronically by: Mack Guise, MD  [facility] Endocrin[facility]  [address] [place], [place] [address] Phone: [phone] FAX: [phone]   CC: Luetta Nutting, [facility] [address]  [address] [name] [address] Phone: [phone] Fax: [phone]   Return to Endocrinology clinic as below: Future Appointments  Date Time Provider [place]  03/08/2022  2:00 PM Lelon Perla, MD [facility] None  03/22/2022 11:00 AM [facility]-NURSE HEALTH ADVISOR [facility] None

## 2021-12-18 NOTE — Patient Instructions (Signed)

## 2021-12-22 LAB — ACTH: C206 ACTH: 26 pg/mL (ref 6–50)

## 2021-12-28 DIAGNOSIS — M4322 Fusion of spine, cervical region: Secondary | ICD-10-CM | POA: Diagnosis not present

## 2021-12-28 DIAGNOSIS — M5116 Intervertebral disc disorders with radiculopathy, lumbar region: Secondary | ICD-10-CM | POA: Diagnosis not present

## 2021-12-28 DIAGNOSIS — I10 Essential (primary) hypertension: Secondary | ICD-10-CM | POA: Diagnosis not present

## 2022-02-17 ENCOUNTER — Encounter: Payer: Self-pay | Admitting: Internal Medicine

## 2022-02-17 ENCOUNTER — Other Ambulatory Visit (INDEPENDENT_AMBULATORY_CARE_PROVIDER_SITE_OTHER): Payer: Medicare Other

## 2022-02-17 DIAGNOSIS — E039 Hypothyroidism, unspecified: Secondary | ICD-10-CM | POA: Diagnosis not present

## 2022-02-17 LAB — TSH: TSH: 1.08 u[IU]/mL (ref 0.35–5.50)

## 2022-02-22 DIAGNOSIS — M25512 Pain in left shoulder: Secondary | ICD-10-CM | POA: Diagnosis not present

## 2022-02-22 DIAGNOSIS — I10 Essential (primary) hypertension: Secondary | ICD-10-CM | POA: Diagnosis not present

## 2022-02-22 DIAGNOSIS — M5116 Intervertebral disc disorders with radiculopathy, lumbar region: Secondary | ICD-10-CM | POA: Diagnosis not present

## 2022-02-22 NOTE — Progress Notes (Signed)
HPI: FU CAD.  Patient is status post coronary artery bypass graft in 2015 in [place] (LIMA to LAD, RIMA to OM and SVG to RPL). Echocardiogram January 2021 showed normal LV function, mild left ventricular hypertrophy, mild aortic insufficiency.  Abdominal ultrasound January 2021 showed no aneurysm.  Since last seen the patient denies any dyspnea on exertion, orthopnea, PND, pedal edema, palpitations, syncope or chest pain.   Current Outpatient Medications  Medication Sig Dispense Refill   allopurinol (ZYLOPRIM) 300 MG tablet TAKE 1 TABLET(300 MG) BY MOUTH DAILY 90 tablet 3   aspirin 81 MG tablet Take 1 tablet (81 mg total) by mouth daily. 90 tablet 0   atorvastatin (LIPITOR) 80 MG tablet TAKE 1 TABLET(80 MG) BY MOUTH DAILY 90 tablet 3   buPROPion (WELLBUTRIN XL) 150 MG 24 hr tablet Take 1 tablet (150 mg total) by mouth daily. 90 tablet 1   DULoxetine (CYMBALTA) 60 MG capsule TAKE 1 CAPSULE(60 MG) BY MOUTH DAILY 90 capsule 1   ezetimibe (ZETIA) 10 MG tablet Take 1 tablet (10 mg total) by mouth daily. 90 tablet 3   fentaNYL (DURAGESIC - DOSED MCG/HR) 25 MCG/HR patch UNW AND APP 1 PA TO SKIN Q 72 H. REMOVE OLD PATCHES B ADDING NEW PA  0   gabapentin (NEURONTIN) 300 MG capsule TAKE 2 CAPSULES(600 MG) BY MOUTH THREE TIMES DAILY 540 capsule 3   HYDROcodone-acetaminophen (NORCO/VICODIN) 5-325 MG tablet Take 1 tablet by mouth every 6 (six) hours as needed. 30 tablet 0   levothyroxine (SYNTHROID) 100 MCG tablet TAKE 1 TABLET BY MOUTH DAILY BEFORE BREAKFAST 90 tablet 2   pantoprazole (PROTONIX) 40 MG tablet TAKE 1 TABLET(40 MG) BY MOUTH DAILY 90 tablet 0   polyethylene glycol powder (GLYCOLAX/MIRALAX) 17 GM/SCOOP powder Take 17 g by mouth 2 (two) times daily as needed. 3350 g 1   topiramate (TOPAMAX) 100 MG tablet Take 100 mg by mouth 2 (two) times daily.     No current facility-administered medications for this visit.     Past Medical History:  Diagnosis Date   Anxiety    CAD (coronary  artery disease)    Cervical strain 11/21/2014   Cervicogenic headache 11/21/2014   Depression    Fibromyalgia    GERD (gastroesophageal reflux disease)    Gout 07/09/2014   Hyperlipidemia    Hypothyroidism 07/09/2014   Memory difficulty 02/17/2015   S/P CABG x 3 07/09/2014    Past Surgical History:  Procedure Laterality Date   BACK SURGERY     CATARACT EXTRACTION Bilateral    CERVICAL FUSION     CORONARY ARTERY BYPASS GRAFT  March 2015   Performed in [place]      Social History   Socioeconomic History   Marital status: Married    Spouse name: Anderson Malta   Number of children: 4   Years of education: Master's   Highest education level: Master's degree (e.g., MA, MS, MEng, MEd, MSW, MBA)  Occupational History   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  Tobacco Use   Smoking status: Former    Types: Cigarettes    Quit date: 04/12/1985    Years since quitting: 36.9   Smokeless tobacco: Never  Vaping Use   Vaping Use: Never used  Substance and Sexual Activity   Alcohol use: No    Alcohol/week: 0.0 standard drinks of alcohol   Drug use: No  Sexual activity: Not Currently    Partners: Female  Other Topics Concern   Not on file  Social History Narrative   Lives with his wife and daughter and grandchildren. He enjoys remote control sailing.   Social Determinants of Health   Financial Resource Strain: Low Risk  (03/20/2021)   Overall Financial Resource Strain (CARDIA)    Difficulty of Paying Living Expenses: Not hard at all  Food Insecurity: No Food Insecurity (03/20/2021)   Hunger Vital Sign    Worried About Running Out of Food in the Last Year: Never true    Ran Out of Food in the Last Year: Never true  Transportation Needs: No Transportation Needs (03/20/2021)   PRAPARE - Hydrologist (Medical): No    Lack of Transportation (Non-Medical): No  Physical Activity: Inactive  (03/20/2021)   Exercise Vital Sign    Days of Exercise per Week: 0 days    Minutes of Exercise per Session: 0 min  Stress: No Stress Concern Present (03/20/2021)   [place]    Feeling of Stress : Not at all  Social Connections: Moderately Integrated (03/20/2021)   Social Connection and Isolation Panel [NHANES]    Frequency of Communication with Friends and Family: Twice a week    Frequency of Social Gatherings with Friends and Family: Once a week    Attends Religious Services: More than 4 times per year    Active Member of [employer] or Organizations: No    Attends Archivist Meetings: Never    Marital Status: Married  Human resources officer Violence: Not At Risk (03/20/2021)   Humiliation, Afraid, Rape, and Kick questionnaire    Fear of Current or Ex-Partner: No    Emotionally Abused: No    Physically Abused: No    Sexually Abused: No    Family History  Problem Relation Age of Onset   Heart attack Father    Stroke Mother    Thyroid disease Mother    Breast cancer Sister    Thyroid disease Brother    Thyroid disease Sister    Thyroid disease Sister     ROS: no fevers or chills, productive cough, hemoptysis, dysphasia, odynophagia, melena, hematochezia, dysuria, hematuria, rash, seizure activity, orthopnea, PND, pedal edema, claudication. Remaining systems are negative.  Physical Exam: Well-developed well-nourished in no acute distress.  Skin is warm and dry.  HEENT is normal.  Neck is supple.  Chest is clear to auscultation with normal expansion.  Cardiovascular exam is regular rate and rhythm.  Abdominal exam nontender or distended. No masses palpated. Extremities show no edema. neuro grossly intact  ECG-normal sinus rhythm at a rate of 74, nonspecific ST changes.  Personally reviewed  A/P  1 coronary artery disease status post coronary artery bypass graft-patient doing well.  He denies chest pain.   Continue medical therapy including aspirin and statin.  2 hyperlipidemia-continue Lipitor and Zetia.  Check lipids and liver.  Kirk Ruths, MD

## 2022-03-03 ENCOUNTER — Other Ambulatory Visit: Payer: Self-pay

## 2022-03-03 MED ORDER — BUPROPION HCL ER (XL) 150 MG PO TB24: 150.0000 mg | ORAL_TABLET | Freq: Every day | ORAL | 1 refills | Status: DC

## 2022-03-05 ENCOUNTER — Other Ambulatory Visit: Payer: Self-pay | Admitting: Family Medicine

## 2022-03-05 DIAGNOSIS — E7800 Pure hypercholesterolemia, unspecified: Secondary | ICD-10-CM

## 2022-03-08 ENCOUNTER — Encounter: Payer: Self-pay | Admitting: Cardiology

## 2022-03-08 ENCOUNTER — Ambulatory Visit (INDEPENDENT_AMBULATORY_CARE_PROVIDER_SITE_OTHER): Payer: Medicare Other | Admitting: Cardiology

## 2022-03-08 VITALS — BP 118/79 | HR 74 | Ht 70.0 in | Wt 206.0 lb

## 2022-03-08 DIAGNOSIS — E7800 Pure hypercholesterolemia, unspecified: Secondary | ICD-10-CM | POA: Diagnosis not present

## 2022-03-08 DIAGNOSIS — I2510 Atherosclerotic heart disease of native coronary artery without angina pectoris: Secondary | ICD-10-CM | POA: Diagnosis not present

## 2022-03-08 NOTE — Patient Instructions (Signed)
  Follow-Up: At  [facility], you and your health needs are our priority.  As part of our continuing mission to provide you with exceptional heart care, we have created designated Provider Care Teams.  These Care Teams include your primary Cardiologist (physician) and Advanced Practice Providers (APPs -  Physician Assistants and Nurse Practitioners) who all work together to provide you with the care you need, when you need it.  We recommend signing up for the patient portal called "MyChart".  Sign up information is provided on this After Visit Summary.  MyChart is used to connect with patients for Virtual Visits (Telemedicine).  Patients are able to view lab/test results, encounter notes, upcoming appointments, etc.  Non-urgent messages can be sent to your provider as well.   To learn more about what you can do with MyChart, go to [email or web address].    Your next appointment:   12 month(s)  The format for your next appointment:   In Person  Provider:   Brian Crenshaw, MD   

## 2022-03-12 DIAGNOSIS — M25512 Pain in left shoulder: Secondary | ICD-10-CM | POA: Diagnosis not present

## 2022-03-12 DIAGNOSIS — M75112 Incomplete rotator cuff tear or rupture of left shoulder, not specified as traumatic: Secondary | ICD-10-CM | POA: Diagnosis not present

## 2022-03-17 DIAGNOSIS — M25512 Pain in left shoulder: Secondary | ICD-10-CM | POA: Diagnosis not present

## 2022-03-17 DIAGNOSIS — R531 Weakness: Secondary | ICD-10-CM | POA: Diagnosis not present

## 2022-03-17 DIAGNOSIS — M25612 Stiffness of left shoulder, not elsewhere classified: Secondary | ICD-10-CM | POA: Diagnosis not present

## 2022-03-17 DIAGNOSIS — M25312 Other instability, left shoulder: Secondary | ICD-10-CM | POA: Diagnosis not present

## 2022-03-18 ENCOUNTER — Other Ambulatory Visit: Payer: Self-pay | Admitting: Cardiology

## 2022-03-18 DIAGNOSIS — E7800 Pure hypercholesterolemia, unspecified: Secondary | ICD-10-CM

## 2022-03-18 MED ORDER — EZETIMIBE 10 MG PO TABS: 10.0000 mg | ORAL_TABLET | Freq: Every day | ORAL | 3 refills | Status: DC

## 2022-03-18 NOTE — Telephone Encounter (Signed)
Rx(s) sent to pharmacy electronically.  

## 2022-03-19 DIAGNOSIS — R531 Weakness: Secondary | ICD-10-CM | POA: Diagnosis not present

## 2022-03-19 DIAGNOSIS — M25512 Pain in left shoulder: Secondary | ICD-10-CM | POA: Diagnosis not present

## 2022-03-19 DIAGNOSIS — M25312 Other instability, left shoulder: Secondary | ICD-10-CM | POA: Diagnosis not present

## 2022-03-19 DIAGNOSIS — M25612 Stiffness of left shoulder, not elsewhere classified: Secondary | ICD-10-CM | POA: Diagnosis not present

## 2022-03-22 ENCOUNTER — Ambulatory Visit (INDEPENDENT_AMBULATORY_CARE_PROVIDER_SITE_OTHER): Payer: Medicare Other | Admitting: Family Medicine

## 2022-03-22 DIAGNOSIS — Z0000 Encounter for general adult medical examination without abnormal findings: Secondary | ICD-10-CM

## 2022-03-22 NOTE — Patient Instructions (Addendum)
[place] Maintenance Summary and Written Plan of Care  [name] ,  Thank you for allowing me to perform your Medicare Annual Wellness Visit and for your ongoing commitment to your health.   Health Maintenance & Immunization History Health Maintenance  Topic Date Due   COVID-19 Vaccine (3 - Moderna risk series) 04/07/2022 (Originally 07/21/2019)   Zoster Vaccines- Shingrix (1 of 2) 06/21/2022 (Originally 12/05/1974)   INFLUENZA VACCINE  07/11/2022 (Originally 11/10/2021)   Medicare Annual Wellness (AWV)  03/23/2023   DTaP/Tdap/Td (2 - Td or Tdap) 03/17/2026   COLONOSCOPY (Pts 45-88yr Insurance coverage will need to be confirmed)  06/28/2026   Pneumonia Vaccine 68 Years old  Completed   Hepatitis C Screening  Completed   HPV VACCINES  Aged Out   Immunization History  Administered Date(s) Administered   Influenza,inj,Quad PF,6+ Mos 12/30/2014, 03/17/2016, 03/15/2018, 02/06/2019   Influenza-Unspecified 02/11/2020, 01/24/2021   Moderna Sars-Covid-2 Vaccination 05/25/2019, 06/23/2019   PNEUMOCOCCAL CONJUGATE-20 01/24/2021   Tdap 03/17/2016   Zoster, Live 08/26/2015    These are the patient goals that we discussed:  Goals Addressed               This Visit's Progress     Patient Stated (pt-stated)        Patient would like to continue to going to the gym and loose a few lbs.         This is a list of Health Maintenance Items that are overdue or due now:  Influenza vaccine Shingles vaccine  Patient declined the vaccines at this time.   Orders/Referrals Placed Today: No orders of the defined types were placed in this encounter.  (Contact our referral department at [phone] you have not spoken with someone about your referral appointment within the next 5 days)    Follow-up Plan Follow-up with John Nutting DO as planned Medicare wellness visit in one year. Patient will access AVS on my chart.      Health Maintenance,  Male Adopting a healthy lifestyle and getting preventive care are important in promoting health and wellness. Ask your health care provider about: The right schedule for you to have regular tests and exams. Things you can do on your own to prevent diseases and keep yourself healthy. What should I know about diet, weight, and exercise? Eat a healthy diet  Eat a diet that includes plenty of vegetables, fruits, low-fat dairy products, and lean protein. Do not eat a lot of foods that are high in solid fats, added sugars, or sodium. Maintain a healthy weight Body mass index (BMI) is a measurement that can be used to identify possible weight problems. It estimates body fat based on height and weight. Your health care provider can help determine your BMI and help you achieve or maintain a healthy weight. Get regular exercise Get regular exercise. This is one of the most important things you can do for your health. Most adults should: Exercise for at least 150 minutes each week. The exercise should increase your heart rate and make you sweat (moderate-intensity exercise). Do strengthening exercises at least twice a week. This is in addition to the moderate-intensity exercise. Spend less time sitting. Even light physical activity can be beneficial. Watch cholesterol and blood lipids Have your blood tested for lipids and cholesterol at 68years of age, then have this test every 5 years. You may need to have your cholesterol levels checked more often if: Your lipid or cholesterol levels are high.  You are older than 68 years of age. You are at high risk for heart disease. What should I know about cancer screening? Many types of cancers can be detected early and may often be prevented. Depending on your health history and family history, you may need to have cancer screening at various ages. This may include screening for: Colorectal cancer. Prostate cancer. Skin cancer. Lung cancer. What should I  know about heart disease, diabetes, and high blood pressure? Blood pressure and heart disease High blood pressure causes heart disease and increases the risk of stroke. This is more likely to develop in people who have high blood pressure readings or are overweight. Talk with your health care provider about your target blood pressure readings. Have your blood pressure checked: Every 3-5 years if you are 24-49 years of age. Every year if you are 47 years old or older. If you are between the ages of 88 and 13 and are a current or former smoker, ask your health care provider if you should have a one-time screening for abdominal aortic aneurysm (AAA). Diabetes Have regular diabetes screenings. This checks your fasting blood sugar level. Have the screening done: Once every three years after age 62 if you are at a normal weight and have a low risk for diabetes. More often and at a younger age if you are overweight or have a high risk for diabetes. What should I know about preventing infection? Hepatitis B If you have a higher risk for hepatitis B, you should be screened for this virus. Talk with your health care provider to find out if you are at risk for hepatitis B infection. Hepatitis C Blood testing is recommended for: Everyone born from [birth year] through 1967. Anyone with known risk factors for hepatitis C. Sexually transmitted infections (STIs) You should be screened each year for STIs, including gonorrhea and chlamydia, if: You are sexually active and are younger than 68 years of age. You are older than 68 years of age and your health care provider tells you that you are at risk for this type of infection. Your sexual activity has changed since you were last screened, and you are at increased risk for chlamydia or gonorrhea. Ask your health care provider if you are at risk. Ask your health care provider about whether you are at high risk for HIV. Your health care provider may recommend a  prescription medicine to help prevent HIV infection. If you choose to take medicine to prevent HIV, you should first get tested for HIV. You should then be tested every 3 months for as long as you are taking the medicine. Follow these instructions at home: Alcohol use Do not drink alcohol if your health care provider tells you not to drink. If you drink alcohol: Limit how much you have to 0-2 drinks a day. Know how much alcohol is in your drink. In the [place] one drink equals one 12 oz bottle of beer (355 mL), one 5 oz glass of wine (148 mL), or one 1 oz glass of hard liquor (44 mL). Lifestyle Do not use any products that contain nicotine or tobacco. These products include cigarettes, chewing tobacco, and vaping devices, such as e-cigarettes. If you need help quitting, ask your health care provider. Do not use street drugs. Do not share needles. Ask your health care provider for help if you need support or information about quitting drugs. General instructions Schedule regular health, dental, and eye exams. Stay current with your vaccines. Tell your health  care provider if: You often feel depressed. You have ever been abused or do not feel safe at home. Summary Adopting a healthy lifestyle and getting preventive care are important in promoting health and wellness. Follow your health care provider's instructions about healthy diet, exercising, and getting tested or screened for diseases. Follow your health care provider's instructions on monitoring your cholesterol and blood pressure. This information is not intended to replace advice given to you by your health care provider. Make sure you discuss any questions you have with your health care provider. Document Revised: 08/18/2020 Document Reviewed: 08/18/2020 Elsevier Patient Education  [place].

## 2022-03-22 NOTE — Progress Notes (Signed)
MEDICARE ANNUAL WELLNESS VISIT  03/22/2022  Telephone Visit Disclaimer This Medicare AWV was conducted by telephone due to national recommendations for restrictions regarding the COVID-19 Pandemic (e.g. social distancing).  I verified, using two identifiers, that I am speaking with John Jefferson. or their authorized healthcare agent. I discussed the limitations, risks, security, and privacy concerns of performing an evaluation and management service by telephone and the potential availability of an in-person appointment in the future. The patient expressed understanding and agreed to proceed.  Location of Patient: Home Location of Provider (nurse):  In the office.  Subjective:    John Jefferson. is a 66 y.o. male patient of Luetta Nutting, DO who had a Medicare Annual Wellness Visit today via telephone. John Jefferson is Retired and lives with their spouse. he has 4 children. he reports that he is socially active and does interact with friends/family regularly. he is moderately physically active and enjoys remote control sailing.  Patient Care Team: Luetta Nutting, DO as PCP - General (Family Medicine) Stanford Breed Denice Bors, MD as PCP - Cardiology (Cardiology) Jola Baptist, [place] as Referring Physician (Chiropractic Medicine)     03/22/2022   11:09 AM 03/20/2021   11:01 AM 05/14/2019   11:09 AM 11/06/2018   10:07 AM 12/02/2014    9:32 AM 11/21/2014    9:13 AM 10/08/2014   10:08 AM  Advanced Directives  Does Patient Have a Medical Advance Directive? Yes Yes No Yes Yes Yes No  Type of Advance Directive Living will;Healthcare Power of Attorney Living will;Healthcare Power of [place];Living will [place];Living will [place];Living will   Does patient want to make changes to medical advance directive? No - Patient declined No - Patient declined  No - Patient declined     Copy of [place] in Chart? No -  copy requested No - copy requested  No - copy requested No - copy requested    Would patient like information on creating a medical advance directive?   No - Patient declined    No - patient declined information    Hospital Utilization Over the Past 12 Months: # of hospitalizations or ER visits: 0 # of surgeries: 0  Review of Systems    Patient reports that his overall health is better compared to last year.  History obtained from chart review and the patient  Patient Reported Readings (BP, Pulse, CBG, Weight, etc) none  Pain Assessment Pain : 0-10 Pain Score: 6  Pain Type: Chronic pain Pain Location: Neck Pain Descriptors / Indicators: Constant Pain Onset: More than a month ago Pain Frequency: Constant Pain Relieving Factors: none  Pain Relieving Factors: none  Current Medications & Allergies (verified) Allergies as of 03/22/2022   No Known Allergies      Medication List        Accurate as of March 22, 2022 11:19 AM. If you have any questions, ask your nurse or doctor.          allopurinol 300 MG tablet Commonly known as: ZYLOPRIM TAKE 1 TABLET(300 MG) BY MOUTH DAILY   aspirin 81 MG tablet Take 1 tablet (81 mg total) by mouth daily.   atorvastatin 80 MG tablet Commonly known as: LIPITOR TAKE 1 TABLET(80 MG) BY MOUTH DAILY   buPROPion 150 MG 24 hr tablet Commonly known as: Wellbutrin XL Take 1 tablet (150 mg total) by mouth daily.   DULoxetine 60 MG capsule Commonly  known as: CYMBALTA TAKE 1 CAPSULE(60 MG) BY MOUTH DAILY   ezetimibe 10 MG tablet Commonly known as: ZETIA Take 1 tablet (10 mg total) by mouth daily.   fentaNYL 25 MCG/HR Commonly known as: DURAGESIC UNW AND APP 1 PA TO SKIN Q 72 H. REMOVE OLD PATCHES B ADDING NEW PA   gabapentin 300 MG capsule Commonly known as: NEURONTIN TAKE 2 CAPSULES(600 MG) BY MOUTH THREE TIMES DAILY   HYDROcodone-acetaminophen 5-325 MG tablet Commonly known as: NORCO/VICODIN Take 1 tablet by mouth  every 6 (six) hours as needed.   levothyroxine 100 MCG tablet Commonly known as: SYNTHROID TAKE 1 TABLET BY MOUTH DAILY BEFORE BREAKFAST   pantoprazole 40 MG tablet Commonly known as: PROTONIX TAKE 1 TABLET(40 MG) BY MOUTH DAILY   polyethylene glycol powder 17 GM/SCOOP powder Commonly known as: GLYCOLAX/MIRALAX Take 17 g by mouth 2 (two) times daily as needed.   topiramate 100 MG tablet Commonly known as: TOPAMAX Take 100 mg by mouth 2 (two) times daily.        History (reviewed): Past Medical History:  Diagnosis Date   Anxiety    CAD (coronary artery disease)    Cervical strain 11/21/2014   Cervicogenic headache 11/21/2014   Depression    Fibromyalgia    GERD (gastroesophageal reflux disease)    Gout 07/09/2014   Hyperlipidemia    Hypothyroidism 07/09/2014   Memory difficulty 02/17/2015   S/P CABG x 3 07/09/2014   Past Surgical History:  Procedure Laterality Date   BACK SURGERY     CATARACT EXTRACTION Bilateral    CERVICAL FUSION     CORONARY ARTERY BYPASS GRAFT  March 2015   Performed in [place]     Family History  Problem Relation Age of Onset   Heart attack Father    Stroke Mother    Thyroid disease Mother    Breast cancer Sister    Thyroid disease Brother    Thyroid disease Sister    Thyroid disease Sister    Social History   Socioeconomic History   Marital status: Married    Spouse name: Anderson Malta   Number of children: 4   Years of education: Master's   Highest education level: Master's degree (e.g., MA, MS, MEng, MEd, MSW, MBA)  Occupational History   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  Tobacco Use   Smoking status: Former    Types: Cigarettes    Quit date: 04/12/1985    Years since quitting: 36.9   Smokeless tobacco: Never  Vaping Use   Vaping Use: Never used  Substance and Sexual Activity   Alcohol use: No    Alcohol/week: 0.0 standard drinks of alcohol   Drug use:  No   Sexual activity: Not Currently    Partners: Female  Other Topics Concern   Not on file  Social History Narrative   Lives with his wife. He enjoys remote control sailing.   Social Determinants of Health   Financial Resource Strain: Low Risk  (03/22/2022)   Overall Financial Resource Strain (CARDIA)    Difficulty of Paying Living Expenses: Not hard at all  Food Insecurity: No Food Insecurity (03/22/2022)   Hunger Vital Sign    Worried About Running Out of Food in the Last Year: Never true    Ran Out of Food in the Last Year: Never true  Transportation Needs: No Transportation Needs (03/22/2022)   PRAPARE -  Hydrologist (Medical): No    Lack of Transportation (Non-Medical): No  Physical Activity: Sufficiently Active (03/22/2022)   Exercise Vital Sign    Days of Exercise per Week: 2 days    Minutes of Exercise per Session: 90 min  Stress: No Stress Concern Present (03/22/2022)   John Jefferson    Feeling of Stress : Not at all  Social Connections: Moderately Integrated (03/22/2022)   Social Connection and Isolation Panel [NHANES]    Frequency of Communication with Friends and Family: Twice a week    Frequency of Social Gatherings with Friends and Family: Twice a week    Attends Religious Services: More than 4 times per year    Active Member of [employer] or Organizations: No    Attends Archivist Meetings: Never    Marital Status: Married    Activities of Daily Living    03/22/2022   11:09 AM  In your present state of health, do you have any difficulty performing the following activities:  Hearing? 1  Comment some hearing loss in bilateral ears.  Vision? 0  Difficulty concentrating or making decisions? 1  Comment sometimes.  Walking or climbing stairs? 0  Dressing or bathing? 0  Doing errands, shopping? 0  Preparing Food and eating ? N  Using the Toilet? N  In the past  six months, have you accidently leaked urine? N  Do you have problems with loss of bowel control? N  Managing your Medications? N  Managing your Finances? N  Housekeeping or managing your Housekeeping? N    Patient Education/ Literacy How often do you need to have someone help you when you read instructions, pamphlets, or other written materials from your doctor or pharmacy?: 1 - Never What is the last grade level you completed in school?: Masters degree  Exercise Current Exercise Habits: Home exercise routine, Type of exercise: treadmill;strength training/weights;Other - see comments (stationary bike, arm bike), Time (Minutes): > 60, Frequency (Times/Week): 2, Weekly Exercise (Minutes/Week): 0, Intensity: Moderate, Exercise limited by: None identified  Diet Patient reports consuming 3 meals a day and 1 snack(s) a day Patient reports that his primary diet is: Regular Patient reports that she does have regular access to food.   Depression Screen    03/22/2022   11:08 AM 11/19/2021   11:54 AM 08/12/2021    9:47 AM 03/20/2021   11:01 AM 03/16/2021    2:09 PM 05/14/2020    9:23 AM 05/14/2019   11:31 AM  PHQ 2/9 Scores  PHQ - 2 Score 0 2 2 0 0 0 3  PHQ- 9 Score  '6 6   4 15     '$ Fall Risk    03/22/2022   11:08 AM 08/12/2021    8:59 AM 03/20/2021   11:00 AM 03/16/2021    2:09 PM 05/14/2020    9:23 AM  Fall Risk   Falls in the past year? 0 0 0 0 0  Number falls in past yr: 0 0 0 0 0  Injury with Fall? 0 0 0 0 0  Risk for fall due to : No Fall Risks No Fall Risks No Fall Risks No Fall Risks No Fall Risks  Follow up Falls evaluation completed Falls evaluation completed Falls evaluation completed Falls evaluation completed Falls evaluation completed     Objective:  John Jefferson. seemed alert and oriented and he participated appropriately during our telephone visit.  Blood Pressure Weight BMI  BP Readings from Last 3 Encounters:  03/08/22 118/79  12/18/21 118/74  11/19/21 111/70    Wt Readings from Last 3 Encounters:  03/08/22 206 lb (93.4 kg)  12/18/21 207 lb 6.4 oz (94.1 kg)  11/19/21 210 lb (95.3 kg)   BMI Readings from Last 1 Encounters:  03/08/22 29.56 kg/m    *Unable to obtain current vital signs, weight, and BMI due to telephone visit type  Hearing/Vision  John Jefferson did not seem to have difficulty with hearing/understanding during the telephone conversation Reports that he has had a formal eye exam by an eye care professional within the past year Reports that he has not had a formal hearing evaluation within the past year *Unable to fully assess hearing and vision during telephone visit type  Cognitive Function:    03/22/2022   11:12 AM 03/20/2021   11:05 AM 11/06/2018   10:12 AM  6CIT Screen  What Year? 0 points 0 points 0 points  What month? 0 points 0 points 0 points  What time? 0 points 0 points 0 points  Count back from 20 0 points 0 points 0 points  Months in reverse 0 points 0 points 2 points  Repeat phrase 0 points 0 points 0 points  Total Score 0 points 0 points 2 points   (Normal:0-7, Significant for Dysfunction: >8)  Normal Cognitive Function Screening: Yes   Immunization & Health Maintenance Record Immunization History  Administered Date(s) Administered   Influenza,inj,Quad PF,6+ Mos 12/30/2014, 03/17/2016, 03/15/2018, 02/06/2019   Influenza-Unspecified 02/11/2020, 01/24/2021   Moderna Sars-Covid-2 Vaccination 05/25/2019, 06/23/2019   PNEUMOCOCCAL CONJUGATE-20 01/24/2021   Tdap 03/17/2016   Zoster, Live 08/26/2015    Health Maintenance  Topic Date Due   COVID-19 Vaccine (3 - Moderna risk series) 04/07/2022 (Originally 07/21/2019)   Zoster Vaccines- Shingrix (1 of 2) 06/21/2022 (Originally 12/05/1972)   INFLUENZA VACCINE  07/11/2022 (Originally 11/10/2021)   Medicare Annual Wellness (AWV)  03/23/2023   DTaP/Tdap/Td (2 - Td or Tdap) 03/17/2026   COLONOSCOPY (Pts 45-69yr Insurance coverage will need to be confirmed)  06/28/2026    Pneumonia Vaccine 66 Years old  Completed   Hepatitis C Screening  Completed   HPV VACCINES  Aged Out       Assessment  This is a routine wellness examination for John Jefferson.Marland Kitchen Health Maintenance: Due or Overdue There are no preventive care reminders to display for this patient.   John Jefferson does not need a referral for Community Assistance: Care Management:   no Social Work:    no Prescription Assistance:  no Nutrition/Diabetes Education:  no   Plan:  Personalized Goals  Goals Addressed               This Visit's Progress     Patient Stated (pt-stated)        Patient would like to continue to going to the gym and loose a few lbs.       Personalized Health Maintenance & Screening Recommendations  Influenza vaccine Shingles vaccine  Patient declined the vaccines at this time.   Lung Cancer Screening Recommended: no (Low Dose CT Chest recommended if Age 68-80years, 30 pack-year currently smoking OR have quit w/in past 15 years) Hepatitis C Screening recommended: no HIV Screening recommended: no  Advanced Directives: Written information was not prepared per patient's request.  Referrals & Orders No orders of the defined types were placed in this encounter.   Follow-up Plan Follow-up with  Luetta Nutting, DO as planned Medicare wellness visit in one year. Patient will access AVS on my chart.   I have personally reviewed and noted the following in the patient's chart:   Medical and social history Use of alcohol, tobacco or illicit drugs  Current medications and supplements Functional ability and status Nutritional status Physical activity Advanced directives List of other physicians Hospitalizations, surgeries, and ER visits in previous 12 months Vitals Screenings to include cognitive, depression, and falls Referrals and appointments  In addition, I have reviewed and discussed with John Jefferson. certain preventive protocols,  quality metrics, and best practice recommendations. A written personalized care plan for preventive services as well as general preventive health recommendations is available and can be mailed to the patient at his request.      Tinnie Gens, RN BSN  03/22/2022

## 2022-03-23 DIAGNOSIS — M25512 Pain in left shoulder: Secondary | ICD-10-CM | POA: Diagnosis not present

## 2022-03-23 DIAGNOSIS — M25312 Other instability, left shoulder: Secondary | ICD-10-CM | POA: Diagnosis not present

## 2022-03-23 DIAGNOSIS — M25612 Stiffness of left shoulder, not elsewhere classified: Secondary | ICD-10-CM | POA: Diagnosis not present

## 2022-03-23 DIAGNOSIS — R531 Weakness: Secondary | ICD-10-CM | POA: Diagnosis not present

## 2022-03-26 DIAGNOSIS — M25312 Other instability, left shoulder: Secondary | ICD-10-CM | POA: Diagnosis not present

## 2022-03-26 DIAGNOSIS — R531 Weakness: Secondary | ICD-10-CM | POA: Diagnosis not present

## 2022-03-26 DIAGNOSIS — M25512 Pain in left shoulder: Secondary | ICD-10-CM | POA: Diagnosis not present

## 2022-03-26 DIAGNOSIS — M25612 Stiffness of left shoulder, not elsewhere classified: Secondary | ICD-10-CM | POA: Diagnosis not present

## 2022-03-30 DIAGNOSIS — M25512 Pain in left shoulder: Secondary | ICD-10-CM | POA: Diagnosis not present

## 2022-03-30 DIAGNOSIS — R531 Weakness: Secondary | ICD-10-CM | POA: Diagnosis not present

## 2022-03-30 DIAGNOSIS — M25312 Other instability, left shoulder: Secondary | ICD-10-CM | POA: Diagnosis not present

## 2022-03-30 DIAGNOSIS — M25612 Stiffness of left shoulder, not elsewhere classified: Secondary | ICD-10-CM | POA: Diagnosis not present

## 2022-04-01 DIAGNOSIS — M25612 Stiffness of left shoulder, not elsewhere classified: Secondary | ICD-10-CM | POA: Diagnosis not present

## 2022-04-01 DIAGNOSIS — M25312 Other instability, left shoulder: Secondary | ICD-10-CM | POA: Diagnosis not present

## 2022-04-01 DIAGNOSIS — R531 Weakness: Secondary | ICD-10-CM | POA: Diagnosis not present

## 2022-04-01 DIAGNOSIS — M25512 Pain in left shoulder: Secondary | ICD-10-CM | POA: Diagnosis not present

## 2022-04-06 DIAGNOSIS — M25612 Stiffness of left shoulder, not elsewhere classified: Secondary | ICD-10-CM | POA: Diagnosis not present

## 2022-04-06 DIAGNOSIS — M25312 Other instability, left shoulder: Secondary | ICD-10-CM | POA: Diagnosis not present

## 2022-04-06 DIAGNOSIS — R531 Weakness: Secondary | ICD-10-CM | POA: Diagnosis not present

## 2022-04-06 DIAGNOSIS — M25512 Pain in left shoulder: Secondary | ICD-10-CM | POA: Diagnosis not present

## 2022-04-07 DIAGNOSIS — M75112 Incomplete rotator cuff tear or rupture of left shoulder, not specified as traumatic: Secondary | ICD-10-CM | POA: Diagnosis not present

## 2022-04-07 DIAGNOSIS — Z79899 Other long term (current) drug therapy: Secondary | ICD-10-CM | POA: Diagnosis not present

## 2022-04-07 DIAGNOSIS — Z79891 Long term (current) use of opiate analgesic: Secondary | ICD-10-CM | POA: Diagnosis not present

## 2022-04-07 DIAGNOSIS — G894 Chronic pain syndrome: Secondary | ICD-10-CM | POA: Diagnosis not present

## 2022-04-19 DIAGNOSIS — M75112 Incomplete rotator cuff tear or rupture of left shoulder, not specified as traumatic: Secondary | ICD-10-CM | POA: Diagnosis not present

## 2022-04-20 ENCOUNTER — Encounter: Payer: Self-pay | Admitting: Family Medicine

## 2022-04-20 ENCOUNTER — Ambulatory Visit (INDEPENDENT_AMBULATORY_CARE_PROVIDER_SITE_OTHER): Payer: Medicare HMO | Admitting: Family Medicine

## 2022-04-20 VITALS — BP 134/73 | HR 79 | Temp 98.5°F | Wt 203.0 lb

## 2022-04-20 DIAGNOSIS — J209 Acute bronchitis, unspecified: Secondary | ICD-10-CM

## 2022-04-20 MED ORDER — PREDNISONE 20 MG PO TABS: 40.0000 mg | ORAL_TABLET | Freq: Every day | ORAL | 0 refills | Status: DC

## 2022-04-20 NOTE — Patient Instructions (Addendum)
Pick up some Mucinex over-the-counter.  Typically you take 1 twice a day.  Make sure to drink plenty of water with it. He is call back by the end of the week if you are not improving or sooner if you feel like you are developing new symptoms.

## 2022-04-20 NOTE — Progress Notes (Signed)
Sxs x 2 wks cough he states that his lungs feel full if fluid.

## 2022-04-20 NOTE — Progress Notes (Signed)
   Acute Office Visit  Subjective:     Patient ID: John Jefferson., male    DOB: 1955-10-11, 67 y.o.   MRN: [id number]  No chief complaint on file.   HPI Patient is in today for Cough.  He says initially was symptoms first started he was having chest discomfort, gray-colored sputum and fever.  The fever and chest discomfort has dissipated but he still has a persistent cough there now he feels like he is not actually moving the sputum out but he can hear it in his chest.  No sore throat or ear pain.  No GI symptoms.  No nasal congestion.  He says typically if he gets sick it goes to his lungs. Sxs x 2 wks cough he states that his lungs feel full if fluid    ROS      Objective:    BP 134/73   Pulse 79   Temp 98.5 F (36.9 C)   Wt 203 lb (92.1 kg)   SpO2 95%   BMI 29.13 kg/m    Physical Exam Constitutional:      Appearance: He is well-developed.  HENT:     Head: Normocephalic and atraumatic.     Right Ear: Tympanic membrane, ear canal and external ear normal.     Left Ear: Tympanic membrane, ear canal and external ear normal.     Nose: Nose normal.     Mouth/Throat:     Pharynx: Oropharynx is clear.  Eyes:     Conjunctiva/sclera: Conjunctivae normal.     Pupils: Pupils are equal, round, and reactive to light.  Neck:     Thyroid: No thyromegaly.  Cardiovascular:     Rate and Rhythm: Normal rate.     Heart sounds: Normal heart sounds.  Pulmonary:     Effort: Pulmonary effort is normal.     Comments: diffuse rhonchi, no wheezing or crackles. Musculoskeletal:     Cervical back: Neck supple.  Lymphadenopathy:     Cervical: No cervical adenopathy.  Skin:    General: Skin is warm and dry.  Neurological:     Mental Status: He is alert and oriented to person, place, and time.     No results found for any visits on 04/20/22.      Assessment & Plan:   Problem List Items Addressed This Visit   None Visit Diagnoses     Acute bronchitis, unspecified organism     -  Primary      Acute bronchitis-we will treat with prednisone.  If not feeling better by the end of the week then encouraged him to call back and we can consider a course of antibiotics if needed.  Also recommended trial of over-the-counter Mucinex.  If he develops new symptoms such as fever or increased shortness of breath then please let us know immediately and we will evaluate further with chest x-ray.  Meds ordered this encounter  Medications   predniSONE (DELTASONE) 20 MG tablet    Sig: Take 2 tablets (40 mg total) by mouth daily with breakfast.    Dispense:  10 tablet    Refill:  0    No follow-ups on file.  Beatrice Lecher, MD

## 2022-04-27 ENCOUNTER — Other Ambulatory Visit: Payer: Self-pay

## 2022-04-27 ENCOUNTER — Encounter: Payer: Self-pay | Admitting: Family Medicine

## 2022-04-27 ENCOUNTER — Ambulatory Visit (INDEPENDENT_AMBULATORY_CARE_PROVIDER_SITE_OTHER): Payer: Medicare HMO | Admitting: Family Medicine

## 2022-04-27 ENCOUNTER — Ambulatory Visit (INDEPENDENT_AMBULATORY_CARE_PROVIDER_SITE_OTHER): Payer: Medicare HMO

## 2022-04-27 VITALS — BP 109/65 | HR 82 | Ht 70.0 in | Wt 206.0 lb

## 2022-04-27 DIAGNOSIS — R059 Cough, unspecified: Secondary | ICD-10-CM | POA: Diagnosis not present

## 2022-04-27 DIAGNOSIS — R062 Wheezing: Secondary | ICD-10-CM | POA: Diagnosis not present

## 2022-04-27 DIAGNOSIS — R0602 Shortness of breath: Secondary | ICD-10-CM | POA: Diagnosis not present

## 2022-04-27 DIAGNOSIS — E7800 Pure hypercholesterolemia, unspecified: Secondary | ICD-10-CM

## 2022-04-27 DIAGNOSIS — J988 Other specified respiratory disorders: Secondary | ICD-10-CM | POA: Diagnosis not present

## 2022-04-27 MED ORDER — LEVOTHYROXINE SODIUM 100 MCG PO TABS: ORAL_TABLET | ORAL | 2 refills | Status: DC

## 2022-04-27 MED ORDER — PANTOPRAZOLE SODIUM 40 MG PO TBEC: DELAYED_RELEASE_TABLET | ORAL | 0 refills | Status: DC

## 2022-04-27 MED ORDER — EZETIMIBE 10 MG PO TABS: 10.0000 mg | ORAL_TABLET | Freq: Every day | ORAL | 2 refills | Status: DC

## 2022-04-27 MED ORDER — AZITHROMYCIN 250 MG PO TABS: ORAL_TABLET | ORAL | 0 refills | Status: AC

## 2022-04-27 MED ORDER — ALBUTEROL SULFATE HFA 108 (90 BASE) MCG/ACT IN AERS: 2.0000 | INHALATION_SPRAY | Freq: Four times a day (QID) | RESPIRATORY_TRACT | 0 refills | Status: DC | PRN

## 2022-04-27 MED ORDER — ALLOPURINOL 300 MG PO TABS: ORAL_TABLET | ORAL | 2 refills | Status: DC

## 2022-04-27 MED ORDER — ATORVASTATIN CALCIUM 80 MG PO TABS: ORAL_TABLET | ORAL | 2 refills | Status: DC

## 2022-04-27 MED ORDER — BUPROPION HCL ER (XL) 150 MG PO TB24: 150.0000 mg | ORAL_TABLET | Freq: Every day | ORAL | 0 refills | Status: DC

## 2022-04-27 MED ORDER — GABAPENTIN 300 MG PO CAPS: ORAL_CAPSULE | ORAL | 2 refills | Status: DC

## 2022-04-27 MED ORDER — DULOXETINE HCL 60 MG PO CPEP: 60.0000 mg | ORAL_CAPSULE | Freq: Every day | ORAL | 0 refills | Status: DC

## 2022-04-27 NOTE — Patient Instructions (Signed)
Stop downstairs and have chest xray.  Start azithromycin.  Use albuterol inhaler every 6 hours as needed for cough or wheezing.

## 2022-04-27 NOTE — Progress Notes (Signed)
John Jefferson. - 67 y.o. male MRN [id number]  Date of birth: 1956/01/04  Subjective Chief Complaint  Patient presents with   Bronchitis    HPI John Jefferson is a 68 year old male here today with complaint of cough and chest congestion.  He was seen by Dr. Madilyn Fireman about 1 week ago.  Diagnosed with bronchitis at that time and treated with prednisone.  He reports that he has not really had much improvement with prednisone.  Continues to have cough with some intermittent wheezing and mild shortness of breath.  Continues to have some congestion.  Denies fever or chills.  ROS:  A comprehensive ROS was completed and negative except as noted per HPI  No Known Allergies  Past Medical History:  Diagnosis Date   Anxiety    CAD (coronary artery disease)    Cervical strain 11/21/2014   Cervicogenic headache 11/21/2014   Depression    Fibromyalgia    GERD (gastroesophageal reflux disease)    Gout 07/09/2014   Hyperlipidemia    Hypothyroidism 07/09/2014   Memory difficulty 02/17/2015   S/P CABG x 3 07/09/2014    Past Surgical History:  Procedure Laterality Date   BACK SURGERY     CATARACT EXTRACTION Bilateral    CERVICAL FUSION     CORONARY ARTERY BYPASS GRAFT  March 2015   Performed in [place]      Social History   Socioeconomic History   Marital status: Married    Spouse name: Anderson Malta   Number of children: 4   Years of education: Master's   Highest education level: Master's degree (e.g., MA, MS, MEng, MEd, MSW, MBA)  Occupational History   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  Tobacco Use   Smoking status: Former    Types: Cigarettes    Quit date: 04/12/1985    Years since quitting: 37.0   Smokeless tobacco: Never  Vaping Use   Vaping Use: Never used  Substance and Sexual Activity   Alcohol use: No    Alcohol/week: 0.0 standard drinks of alcohol   Drug use: No   Sexual activity: Not Currently     Partners: Female  Other Topics Concern   Not on file  Social History Narrative   Lives with his wife. He enjoys remote control sailing.   Social Determinants of Health   Financial Resource Strain: Low Risk  (03/22/2022)   Overall Financial Resource Strain (CARDIA)    Difficulty of Paying Living Expenses: Not hard at all  Food Insecurity: No Food Insecurity (03/22/2022)   Hunger Vital Sign    Worried About Running Out of Food in the Last Year: Never true    Ran Out of Food in the Last Year: Never true  Transportation Needs: No Transportation Needs (03/22/2022)   PRAPARE - Hydrologist (Medical): No    Lack of Transportation (Non-Medical): No  Physical Activity: Sufficiently Active (03/22/2022)   Exercise Vital Sign    Days of Exercise per Week: 2 days    Minutes of Exercise per Session: 90 min  Stress: No Stress Concern Present (03/22/2022)   [place]    Feeling of Stress : Not at all  Social Connections: Moderately Integrated (03/22/2022)   Social Connection and Isolation Panel [NHANES]    Frequency of Communication with Friends and Family: Twice a week    Frequency  of Social Gatherings with Friends and Family: Twice a week    Attends Religious Services: More than 4 times per year    Active Member of [employer] or Organizations: No    Attends Music therapist: Never    Marital Status: Married    Family History  Problem Relation Age of Onset   Heart attack Father    Stroke Mother    Thyroid disease Mother    Breast cancer Sister    Thyroid disease Brother    Thyroid disease Sister    Thyroid disease Sister     Health Maintenance  Topic Date Due   Zoster Vaccines- Shingrix (1 of 2) 06/21/2022 (Originally 12/05/1972)   INFLUENZA VACCINE  07/11/2022 (Originally 11/10/2021)   COVID-19 Vaccine (3 - Moderna risk series) 05/14/2023 (Originally 07/21/2019)   Medicare Annual  Wellness (AWV)  03/23/2023   DTaP/Tdap/Td (2 - Td or Tdap) 03/17/2026   COLONOSCOPY (Pts 45-44yr Insurance coverage will need to be confirmed)  06/28/2026   Pneumonia Vaccine 67 Years old  Completed   Hepatitis C Screening  Completed   HPV VACCINES  Aged Out     ----------------------------------------------------------------------------------------------------------------------------------------------------------------------------------------------------------------- Physical Exam BP 109/65 (BP Location: Left Arm, Patient Position: Sitting, Cuff Size: Normal)   Pulse 82   Ht '5\' 10"'$  (1.778 m)   Wt 206 lb (93.4 kg)   SpO2 96%   BMI 29.56 kg/m   Physical Exam Constitutional:      Appearance: Normal appearance.  HENT:     Head: Normocephalic and atraumatic.  Eyes:     General: No scleral icterus. Cardiovascular:     Rate and Rhythm: Normal rate and regular rhythm.  Pulmonary:     Effort: Pulmonary effort is normal.     Breath sounds: Normal breath sounds.  Musculoskeletal:     Cervical back: Neck supple.  Neurological:     Mental Status: He is alert.  Psychiatric:        Mood and Affect: Mood normal.        Behavior: Behavior normal.     ------------------------------------------------------------------------------------------------------------------------------------------------------------------------------------------------------------------- Assessment and Plan  Respiratory infection He is having continued worsening of symptoms, slightly diminished breath sounds in the RLL.  Chest x-ray ordered.  Adding azithromycin.  Albuterol as needed for her shortness of breath.  If x-ray consistent with pneumonia we will also add Augmentin.  Contact clinic for significantly worsening symptoms.   Meds ordered this encounter  Medications   azithromycin (ZITHROMAX) 250 MG tablet    Sig: Take 2 tablets on day 1, then 1 tablet daily on days 2 through 5    Dispense:  6 tablet     Refill:  0   albuterol (VENTOLIN HFA) 108 (90 Base) MCG/ACT inhaler    Sig: Inhale 2 puffs into the lungs every 6 (six) hours as needed for wheezing or shortness of breath.    Dispense:  8 g    Refill:  0    No follow-ups on file.    This visit occurred during the SARS-CoV-2 public health emergency.  Safety protocols were in place, including screening questions prior to the visit, additional usage of staff PPE, and extensive cleaning of exam room while observing appropriate contact time as indicated for disinfecting solutions.

## 2022-04-27 NOTE — Assessment & Plan Note (Addendum)
He is having continued worsening of symptoms, slightly diminished breath sounds in the RLL.  Chest x-ray ordered.  Adding azithromycin.  Albuterol as needed for her shortness of breath.  If x-ray consistent with pneumonia we will also add Augmentin.  Contact clinic for significantly worsening symptoms.

## 2022-05-03 DIAGNOSIS — M4322 Fusion of spine, cervical region: Secondary | ICD-10-CM | POA: Diagnosis not present

## 2022-05-03 DIAGNOSIS — Z6829 Body mass index (BMI) 29.0-29.9, adult: Secondary | ICD-10-CM | POA: Diagnosis not present

## 2022-05-03 DIAGNOSIS — M5116 Intervertebral disc disorders with radiculopathy, lumbar region: Secondary | ICD-10-CM | POA: Diagnosis not present

## 2022-05-14 ENCOUNTER — Ambulatory Visit: Payer: Medicare Other | Admitting: Internal Medicine

## 2022-05-25 DIAGNOSIS — H903 Sensorineural hearing loss, bilateral: Secondary | ICD-10-CM | POA: Diagnosis not present

## 2022-06-08 ENCOUNTER — Telehealth (INDEPENDENT_AMBULATORY_CARE_PROVIDER_SITE_OTHER): Payer: Medicare HMO | Admitting: Family Medicine

## 2022-06-08 ENCOUNTER — Encounter: Payer: Self-pay | Admitting: Family Medicine

## 2022-06-08 VITALS — Ht 70.0 in | Wt 206.0 lb

## 2022-06-08 DIAGNOSIS — J0100 Acute maxillary sinusitis, unspecified: Secondary | ICD-10-CM

## 2022-06-08 MED ORDER — DOXYCYCLINE HYCLATE 100 MG PO TABS: 100.0000 mg | ORAL_TABLET | Freq: Two times a day (BID) | ORAL | 0 refills | Status: DC

## 2022-06-08 NOTE — Progress Notes (Signed)
John Jefferson. - 67 y.o. male MRN [id number]  Date of birth: [birth year]   This visit type was conducted due to national recommendations for restrictions regarding the COVID-19 Pandemic (e.g. social distancing).  This format is felt to be most appropriate for this patient at this time.  All issues noted in this document were discussed and addressed.  No physical exam was performed (except for noted visual exam findings with Video Visits).  I discussed the limitations of evaluation and management by telemedicine and the availability of in person appointments. The patient expressed understanding and agreed to proceed.  I connected withNAME@ on 06/08/22 at  1:10 PM EST by a video enabled telemedicine application and verified that I am speaking with the correct person using two identifiers.  Present at visit: John Nutting, DO John Jefferson.   Patient Location: Home [address] [place] [address]   Provider location:   [facility]  Chief Complaint  Patient presents with   URI    HPI  John Jefferson. is a 67 y.o. male who presents via audio/video conferencing for a telehealth visit today.  He has complaint of nasal congestion, runny nose productive of thick yellow mucus. He has pain in the paranasal and maxillary sinuses.  He has some pain in the upper teeth.  Onset was 2-3 days ago.  Similar to previous sinus infections.  He denies fever, chills, body aches, or lower respiratory infection.  He has tried ibuprofen without much relief.    ROS:  A comprehensive ROS was completed and negative except as noted per HPI  Past Medical History:  Diagnosis Date   Anxiety    CAD (coronary artery disease)    Cervical strain 11/21/2014   Cervicogenic headache 11/21/2014   Depression    Fibromyalgia    GERD (gastroesophageal reflux disease)    Gout 07/09/2014   Hyperlipidemia    Hypothyroidism 07/09/2014   Memory difficulty 02/17/2015   S/P CABG x 3 07/09/2014    Past Surgical  History:  Procedure Laterality Date   BACK SURGERY     CATARACT EXTRACTION Bilateral    CERVICAL FUSION     CORONARY ARTERY BYPASS GRAFT  March 2015   Performed in [place]      Family History  Problem Relation Age of Onset   Heart attack Father    Stroke Mother    Thyroid disease Mother    Breast cancer Sister    Thyroid disease Brother    Thyroid disease Sister    Thyroid disease Sister     Social History   Socioeconomic History   Marital status: Married    Spouse name: Anderson Malta   Number of children: 4   Years of education: Master's   Highest education level: Master's degree (e.g., MA, MS, MEng, MEd, MSW, MBA)  Occupational History   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  Tobacco Use   Smoking status: Former    Types: Cigarettes    Quit date: 04/12/1985    Years since quitting: 37.1   Smokeless tobacco: Never  Vaping Use   Vaping Use: Never used  Substance and Sexual Activity   Alcohol use: No    Alcohol/week: 0.0 standard drinks of alcohol   Drug use: No   Sexual activity: Not Currently    Partners: Female  Other Topics Concern   Not on file  Social  History Narrative   Lives with his wife. He enjoys remote control sailing.   Social Determinants of Health   Financial Resource Strain: Low Risk  (03/22/2022)   Overall Financial Resource Strain (CARDIA)    Difficulty of Paying Living Expenses: Not hard at all  Food Insecurity: No Food Insecurity (03/22/2022)   Hunger Vital Sign    Worried About Running Out of Food in the Last Year: Never true    Ran Out of Food in the Last Year: Never true  Transportation Needs: No Transportation Needs (03/22/2022)   PRAPARE - Hydrologist (Medical): No    Lack of Transportation (Non-Medical): No  Physical Activity: Sufficiently Active (03/22/2022)   Exercise Vital Sign    Days of Exercise per Week: 2 days    Minutes of  Exercise per Session: 90 min  Stress: No Stress Concern Present (03/22/2022)   [place]    Feeling of Stress : Not at all  Social Connections: Moderately Integrated (03/22/2022)   Social Connection and Isolation Panel [NHANES]    Frequency of Communication with Friends and Family: Twice a week    Frequency of Social Gatherings with Friends and Family: Twice a week    Attends Religious Services: More than 4 times per year    Active Member of [employer] or Organizations: No    Attends Archivist Meetings: Never    Marital Status: Married  Human resources officer Violence: Not At Risk (03/22/2022)   Humiliation, Afraid, Rape, and Kick questionnaire    Fear of Current or Ex-Partner: No    Emotionally Abused: No    Physically Abused: No    Sexually Abused: No     Current Outpatient Medications:    doxycycline (VIBRA-TABS) 100 MG tablet, Take 1 tablet (100 mg total) by mouth 2 (two) times daily., Disp: 20 tablet, Rfl: 0   albuterol (VENTOLIN HFA) 108 (90 Base) MCG/ACT inhaler, Inhale 2 puffs into the lungs every 6 (six) hours as needed for wheezing or shortness of breath., Disp: 8 g, Rfl: 0   allopurinol (ZYLOPRIM) 300 MG tablet, TAKE 1 TABLET(300 MG) BY MOUTH DAILY, Disp: 90 tablet, Rfl: 2   aspirin 81 MG tablet, Take 1 tablet (81 mg total) by mouth daily., Disp: 90 tablet, Rfl: 0   atorvastatin (LIPITOR) 80 MG tablet, TAKE 1 TABLET(80 MG) BY MOUTH DAILY, Disp: 90 tablet, Rfl: 2   buPROPion (WELLBUTRIN XL) 150 MG 24 hr tablet, Take 1 tablet (150 mg total) by mouth daily., Disp: 90 tablet, Rfl: 0   DULoxetine (CYMBALTA) 60 MG capsule, Take 1 capsule (60 mg total) by mouth daily., Disp: 90 capsule, Rfl: 0   ezetimibe (ZETIA) 10 MG tablet, Take 1 tablet (10 mg total) by mouth daily., Disp: 90 tablet, Rfl: 2   fentaNYL (DURAGESIC - DOSED MCG/HR) 25 MCG/HR patch, UNW AND APP 1 PA TO SKIN Q 72 H. REMOVE OLD PATCHES B ADDING NEW PA,  Disp: , Rfl: 0   gabapentin (NEURONTIN) 300 MG capsule, TAKE 2 CAPSULES(600 MG) BY MOUTH THREE TIMES DAILY, Disp: 540 capsule, Rfl: 2   HYDROcodone-acetaminophen (NORCO/VICODIN) 5-325 MG tablet, Take 1 tablet by mouth every 6 (six) hours as needed., Disp: 30 tablet, Rfl: 0   levothyroxine (SYNTHROID) 100 MCG tablet, TAKE 1 TABLET BY MOUTH DAILY BEFORE BREAKFAST, Disp: 90 tablet, Rfl: 2   pantoprazole (PROTONIX) 40 MG tablet, TAKE 1 TABLET(40 MG) BY MOUTH DAILY, Disp: 90 tablet, Rfl: 0  polyethylene glycol powder (GLYCOLAX/MIRALAX) 17 GM/SCOOP powder, Take 17 g by mouth 2 (two) times daily as needed., Disp: 3350 g, Rfl: 1   topiramate (TOPAMAX) 100 MG tablet, Take 100 mg by mouth 2 (two) times daily., Disp: , Rfl:   EXAM:  VITALS per patient if applicable: Ht '5\' 10"'$  (1.778 m)   Wt 206 lb (93.4 kg)   BMI 29.56 kg/m   GENERAL: alert, oriented, appears well and in no acute distress  HEENT: atraumatic, conjunttiva clear, no obvious abnormalities on inspection of external nose and ears  NECK: normal movements of the head and neck  LUNGS: on inspection no signs of respiratory distress, breathing rate appears normal, no obvious gross SOB, gasping or wheezing  CV: no obvious cyanosis  MS: moves all visible extremities without noticeable abnormality  PSYCH/NEURO: pleasant and cooperative, no obvious depression or anxiety, speech and thought processing grossly intact  ASSESSMENT AND PLAN:  Discussed the following assessment and plan:  Acute maxillary sinusitis Continue supportive care with increased fluid intake.  He may add nasal saline rinse.  Treating with course of doxycycline.  Instructed to contact clinic if symptoms are not resolving.      I discussed the assessment and treatment plan with the patient. The patient was provided an opportunity to ask questions and all were answered. The patient agreed with the plan and demonstrated an understanding of the instructions.   The  patient was advised to call back or seek an in-person evaluation if the symptoms worsen or if the condition fails to improve as anticipated.    John Nutting, DO

## 2022-06-08 NOTE — Assessment & Plan Note (Signed)
Continue supportive care with increased fluid intake.  He may add nasal saline rinse.  Treating with course of doxycycline.  Instructed to contact clinic if symptoms are not resolving.

## 2022-06-18 ENCOUNTER — Ambulatory Visit: Payer: Medicare Other | Admitting: Internal Medicine

## 2022-06-29 DIAGNOSIS — H903 Sensorineural hearing loss, bilateral: Secondary | ICD-10-CM | POA: Diagnosis not present

## 2022-07-05 DIAGNOSIS — M5116 Intervertebral disc disorders with radiculopathy, lumbar region: Secondary | ICD-10-CM | POA: Diagnosis not present

## 2022-07-05 DIAGNOSIS — Z6829 Body mass index (BMI) 29.0-29.9, adult: Secondary | ICD-10-CM | POA: Diagnosis not present

## 2022-07-05 DIAGNOSIS — H903 Sensorineural hearing loss, bilateral: Secondary | ICD-10-CM | POA: Diagnosis not present

## 2022-07-05 DIAGNOSIS — M4322 Fusion of spine, cervical region: Secondary | ICD-10-CM | POA: Diagnosis not present

## 2022-07-14 ENCOUNTER — Other Ambulatory Visit: Payer: Self-pay | Admitting: Family Medicine

## 2022-07-22 ENCOUNTER — Other Ambulatory Visit: Payer: Self-pay | Admitting: Family Medicine

## 2022-07-31 ENCOUNTER — Other Ambulatory Visit: Payer: Self-pay | Admitting: Family Medicine

## 2022-08-04 DIAGNOSIS — E785 Hyperlipidemia, unspecified: Secondary | ICD-10-CM | POA: Diagnosis not present

## 2022-08-04 DIAGNOSIS — F325 Major depressive disorder, single episode, in full remission: Secondary | ICD-10-CM | POA: Diagnosis not present

## 2022-08-04 DIAGNOSIS — Z87891 Personal history of nicotine dependence: Secondary | ICD-10-CM | POA: Diagnosis not present

## 2022-08-04 DIAGNOSIS — Z008 Encounter for other general examination: Secondary | ICD-10-CM | POA: Diagnosis not present

## 2022-08-04 DIAGNOSIS — M1990 Unspecified osteoarthritis, unspecified site: Secondary | ICD-10-CM | POA: Diagnosis not present

## 2022-08-04 DIAGNOSIS — G603 Idiopathic progressive neuropathy: Secondary | ICD-10-CM | POA: Diagnosis not present

## 2022-08-04 DIAGNOSIS — M797 Fibromyalgia: Secondary | ICD-10-CM | POA: Diagnosis not present

## 2022-08-04 DIAGNOSIS — G43909 Migraine, unspecified, not intractable, without status migrainosus: Secondary | ICD-10-CM | POA: Diagnosis not present

## 2022-08-04 DIAGNOSIS — E039 Hypothyroidism, unspecified: Secondary | ICD-10-CM | POA: Diagnosis not present

## 2022-08-04 DIAGNOSIS — Z823 Family history of stroke: Secondary | ICD-10-CM | POA: Diagnosis not present

## 2022-08-04 DIAGNOSIS — K219 Gastro-esophageal reflux disease without esophagitis: Secondary | ICD-10-CM | POA: Diagnosis not present

## 2022-08-04 DIAGNOSIS — I2510 Atherosclerotic heart disease of native coronary artery without angina pectoris: Secondary | ICD-10-CM | POA: Diagnosis not present

## 2022-08-04 DIAGNOSIS — Z8249 Family history of ischemic heart disease and other diseases of the circulatory system: Secondary | ICD-10-CM | POA: Diagnosis not present

## 2022-08-10 DIAGNOSIS — H903 Sensorineural hearing loss, bilateral: Secondary | ICD-10-CM | POA: Diagnosis not present

## 2022-08-12 DIAGNOSIS — S0180XA Unspecified open wound of other part of head, initial encounter: Secondary | ICD-10-CM | POA: Diagnosis not present

## 2022-09-07 ENCOUNTER — Ambulatory Visit (INDEPENDENT_AMBULATORY_CARE_PROVIDER_SITE_OTHER): Payer: Medicare HMO | Admitting: Medical-Surgical

## 2022-09-07 ENCOUNTER — Encounter: Payer: Self-pay | Admitting: Medical-Surgical

## 2022-09-07 VITALS — BP 125/70 | HR 72 | Resp 20 | Ht 70.0 in | Wt 207.0 lb

## 2022-09-07 DIAGNOSIS — W57XXXA Bitten or stung by nonvenomous insect and other nonvenomous arthropods, initial encounter: Secondary | ICD-10-CM

## 2022-09-07 DIAGNOSIS — R21 Rash and other nonspecific skin eruption: Secondary | ICD-10-CM

## 2022-09-07 MED ORDER — DOXYCYCLINE HYCLATE 100 MG PO TABS: 100.0000 mg | ORAL_TABLET | Freq: Two times a day (BID) | ORAL | 0 refills | Status: AC

## 2022-09-07 NOTE — Progress Notes (Signed)
        Established patient visit  History, exam, impression, and plan:  1. Tick bite, unspecified site, initial encounter Pleasant 67 year old male presenting today for evaluation of tick bites that he obtained over the last week. He had one bite in the center of his back that he felt was digging in. That occurred last week. Unsure how long the tick was attached. Over the weekend, he was outside doing yard work and found two more ticks that had bitten him. One was on the inner thigh and was digging in but was removed fairly quickly. The other bite was on the back of the right calf. Unsure how long the tick was attached. Yesterday, he developed a rash at the site of the right calf bite and decided he should be seen. No other concerning symptoms. See below for clinical photo. With unclear idea of tick attachment, engorgement, type, unable to gauge risk for Lyme disease. Presence of rash in a circular pattern with near bullseye appearance concerning. Treating with Doxycycline 100mg  BID x 10 days. Advised to monitor for worsening of the rash or additional symptoms.      Procedures performed this visit: None.  Return if symptoms worsen or fail to improve.  __________________________________ Thayer Ohm, DNP, APRN, FNP-BC Primary Care and Sports Medicine [facility]

## 2022-10-02 ENCOUNTER — Other Ambulatory Visit: Payer: Self-pay | Admitting: Family Medicine

## 2022-10-04 NOTE — Telephone Encounter (Signed)
Pls contact pt to schedule appt with Dr. Ashley Royalty for medication refill of Buproprion & Cymbalta. Sending 30 day refill. Thanks

## 2022-10-04 NOTE — Telephone Encounter (Signed)
Patient scheduled for 7/16, thanks.

## 2022-10-26 ENCOUNTER — Encounter: Payer: Self-pay | Admitting: Family Medicine

## 2022-10-26 ENCOUNTER — Ambulatory Visit: Payer: Medicare HMO | Admitting: Family Medicine

## 2022-10-26 VITALS — BP 102/66 | HR 86 | Ht 70.0 in | Wt 205.0 lb

## 2022-10-26 DIAGNOSIS — F321 Major depressive disorder, single episode, moderate: Secondary | ICD-10-CM | POA: Diagnosis not present

## 2022-10-26 DIAGNOSIS — E039 Hypothyroidism, unspecified: Secondary | ICD-10-CM

## 2022-10-26 MED ORDER — DULOXETINE HCL 60 MG PO CPEP: 60.0000 mg | ORAL_CAPSULE | Freq: Every day | ORAL | 3 refills | Status: DC

## 2022-10-26 MED ORDER — BUPROPION HCL ER (XL) 150 MG PO TB24: 150.0000 mg | ORAL_TABLET | Freq: Every day | ORAL | 3 refills | Status: DC

## 2022-10-26 NOTE — Assessment & Plan Note (Signed)
Lab Results  Component Value Date   TSH 1.08 02/17/2022  He was pretty good at current dose levothyroxine.  Will plan to check labs at next visit.

## 2022-10-26 NOTE — Assessment & Plan Note (Signed)
Doing well with combination of duloxetine and bupropion.  Will continue these at current strength.

## 2022-10-26 NOTE — Progress Notes (Signed)
John Jefferson. - 67 y.o. male MRN [id number]  Date of birth: [birth year]  Subjective Chief Complaint  Patient presents with   Medication Refill    HPI John Jefferson is a 67 year old male here today for follow-up visit.  Reports overall he is doing pretty well.  Requesting refills on duloxetine and bupropion.  He feels that these are working pretty well for him at current strength.  No significant side effects of medication at current strength.  He did see audiology for ringing in his ears and was prescribed hearing aids however has not found these have been very helpful.  Was pretty good with current dose of levothyroxine.  ROS:  A comprehensive ROS was completed and negative except as noted per HPI  No Known Allergies  Past Medical History:  Diagnosis Date   Anxiety    CAD (coronary artery disease)    Cervical strain 11/21/2014   Cervicogenic headache 11/21/2014   Depression    Fibromyalgia    GERD (gastroesophageal reflux disease)    Gout 07/09/2014   Hyperlipidemia    Hypothyroidism 07/09/2014   Memory difficulty 02/17/2015   S/P CABG x 3 07/09/2014    Past Surgical History:  Procedure Laterality Date   BACK SURGERY     CATARACT EXTRACTION Bilateral    CERVICAL FUSION     CORONARY ARTERY BYPASS GRAFT  March 2015   Performed in [facility]   KNEE SURGERY     ROTATOR CUFF REPAIR      Social History   Socioeconomic History   Marital status: Married    Spouse name: John Jefferson   Number of children: 4   Years of education: Master's   Highest education level: Master's degree (e.g., MA, MS, MEng, MEd, MSW, MBA)  Occupational History   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  Tobacco Use   Smoking status: Former    Current packs/day: 0.00    Types: Cigarettes    Quit date: 04/12/1985    Years since quitting: 37.5   Smokeless tobacco: Never  Vaping Use   Vaping status: Never Used  Substance and Sexual Activity   Alcohol use: No    Alcohol/week:  0.0 standard drinks of alcohol   Drug use: No   Sexual activity: Not Currently    Partners: Female  Other Topics Concern   Not on file  Social History Narrative   Lives with his wife. He enjoys remote control sailing.   Social Determinants of Health   Financial Resource Strain: Low Risk  (10/25/2022)   Overall Financial Resource Strain (CARDIA)    Difficulty of Paying Living Expenses: Not very hard  Food Insecurity: No Food Insecurity (10/25/2022)   Hunger Vital Sign    Worried About Running Out of Food in the Last Year: Never true    Ran Out of Food in the Last Year: Never true  Transportation Needs: No Transportation Needs (10/25/2022)   PRAPARE - Administrator, Civil Service (Medical): No    Lack of Transportation (Non-Medical): No  Physical Activity: Insufficiently Active (10/25/2022)   Exercise Vital Sign    Days of Exercise per Week: 3 days    Minutes of Exercise per Session: 20 min  Stress: No Stress Concern Present (10/25/2022)   [employer] of Occupational Health - Occupational Stress Questionnaire    Feeling of Stress : Only a little  Social Connections: Socially Integrated (10/25/2022)   Social Connection and Isolation Panel [NHANES]  Frequency of Communication with Friends and Family: Three times a week    Frequency of Social Gatherings with Friends and Family: Three times a week    Attends Religious Services: More than 4 times per year    Active Member of Clubs or Organizations: Yes    Attends Banker Meetings: More than 4 times per year    Marital Status: Married    Family History  Problem Relation Age of Onset   Heart attack Father    Stroke Mother    Thyroid disease Mother    Breast cancer Sister    Thyroid disease Brother    Thyroid disease Sister    Thyroid disease Sister     Health Maintenance  Topic Date Due   Zoster Vaccines- Shingrix (1 of 2) 12/05/1972   COVID-19 Vaccine (3 - Moderna risk series) 05/14/2023  (Originally 07/21/2019)   INFLUENZA VACCINE  11/11/2022   Medicare Annual Wellness (AWV)  03/23/2023   DTaP/Tdap/Td (2 - Td or Tdap) 03/17/2026   Colonoscopy  06/28/2026   Pneumonia Vaccine 63+ Years old  Completed   Hepatitis C Screening  Completed   HPV VACCINES  Aged Out     ----------------------------------------------------------------------------------------------------------------------------------------------------------------------------------------------------------------- Physical Exam BP 102/66 (BP Location: Left Arm, Patient Position: Sitting, Cuff Size: Large)   Pulse 86   Ht 5\' 10"  (1.778 m)   Wt 205 lb (93 kg)   SpO2 94%   BMI 29.41 kg/m   Physical Exam Constitutional:      Appearance: Normal appearance.  HENT:     Head: Normocephalic and atraumatic.  Eyes:     General: No scleral icterus. Cardiovascular:     Rate and Rhythm: Normal rate and regular rhythm.  Pulmonary:     Effort: Pulmonary effort is normal.     Breath sounds: Normal breath sounds.  Musculoskeletal:     Cervical back: Neck supple.  Neurological:     Mental Status: He is alert.  Psychiatric:        Mood and Affect: Mood normal.        Behavior: Behavior normal.     ------------------------------------------------------------------------------------------------------------------------------------------------------------------------------------------------------------------- Assessment and Plan  Hypothyroidism Lab Results  Component Value Date   TSH 1.08 02/17/2022  He was pretty good at current dose levothyroxine.  Will plan to check labs at next visit.  Depression, major, single episode, moderate (HCC) Doing well with combination of duloxetine and bupropion.  Will continue these at current strength.   Meds ordered this encounter  Medications   buPROPion (WELLBUTRIN XL) 150 MG 24 hr tablet    Sig: Take 1 tablet (150 mg total) by mouth daily.    Dispense:  90 tablet    Refill:   3   DULoxetine (CYMBALTA) 60 MG capsule    Sig: Take 1 capsule (60 mg total) by mouth daily.    Dispense:  90 capsule    Refill:  3    Return in about 4 months (around 02/26/2023) for Annual exam/fasting labs.    This visit occurred during the SARS-CoV-2 public health emergency.  Safety protocols were in place, including screening questions prior to the visit, additional usage of staff PPE, and extensive cleaning of exam room while observing appropriate contact time as indicated for disinfecting solutions.

## 2022-11-01 ENCOUNTER — Other Ambulatory Visit: Payer: Self-pay | Admitting: Family Medicine

## 2022-11-01 DIAGNOSIS — F321 Major depressive disorder, single episode, moderate: Secondary | ICD-10-CM

## 2022-11-08 DIAGNOSIS — M75112 Incomplete rotator cuff tear or rupture of left shoulder, not specified as traumatic: Secondary | ICD-10-CM | POA: Diagnosis not present

## 2022-11-12 DIAGNOSIS — M75112 Incomplete rotator cuff tear or rupture of left shoulder, not specified as traumatic: Secondary | ICD-10-CM | POA: Diagnosis not present

## 2022-12-06 DIAGNOSIS — M75112 Incomplete rotator cuff tear or rupture of left shoulder, not specified as traumatic: Secondary | ICD-10-CM | POA: Diagnosis not present

## 2022-12-31 ENCOUNTER — Other Ambulatory Visit: Payer: Self-pay | Admitting: Family Medicine

## 2023-01-18 ENCOUNTER — Other Ambulatory Visit: Payer: Self-pay | Admitting: Family Medicine

## 2023-01-18 DIAGNOSIS — E7800 Pure hypercholesterolemia, unspecified: Secondary | ICD-10-CM

## 2023-02-07 ENCOUNTER — Ambulatory Visit: Payer: Medicare HMO

## 2023-02-07 ENCOUNTER — Ambulatory Visit (INDEPENDENT_AMBULATORY_CARE_PROVIDER_SITE_OTHER): Payer: Medicare HMO | Admitting: Family Medicine

## 2023-02-07 ENCOUNTER — Encounter: Payer: Self-pay | Admitting: Family Medicine

## 2023-02-07 VITALS — BP 116/75 | HR 72 | Ht 70.0 in | Wt 198.0 lb

## 2023-02-07 DIAGNOSIS — Z0000 Encounter for general adult medical examination without abnormal findings: Secondary | ICD-10-CM | POA: Insufficient documentation

## 2023-02-07 DIAGNOSIS — M1A9XX0 Chronic gout, unspecified, without tophus (tophi): Secondary | ICD-10-CM

## 2023-02-07 DIAGNOSIS — E7800 Pure hypercholesterolemia, unspecified: Secondary | ICD-10-CM

## 2023-02-07 DIAGNOSIS — G8929 Other chronic pain: Secondary | ICD-10-CM

## 2023-02-07 DIAGNOSIS — M1711 Unilateral primary osteoarthritis, right knee: Secondary | ICD-10-CM | POA: Diagnosis not present

## 2023-02-07 DIAGNOSIS — R600 Localized edema: Secondary | ICD-10-CM

## 2023-02-07 DIAGNOSIS — M7989 Other specified soft tissue disorders: Secondary | ICD-10-CM | POA: Diagnosis not present

## 2023-02-07 DIAGNOSIS — Z125 Encounter for screening for malignant neoplasm of prostate: Secondary | ICD-10-CM

## 2023-02-07 DIAGNOSIS — E039 Hypothyroidism, unspecified: Secondary | ICD-10-CM | POA: Diagnosis not present

## 2023-02-07 DIAGNOSIS — M25561 Pain in right knee: Secondary | ICD-10-CM | POA: Diagnosis not present

## 2023-02-07 DIAGNOSIS — E559 Vitamin D deficiency, unspecified: Secondary | ICD-10-CM

## 2023-02-07 NOTE — Assessment & Plan Note (Signed)
Well adult Orders Placed This Encounter  Procedures   DG Knee Complete 4 Views Right    Standing Status:   Future    Standing Expiration Date:   02/07/2024    Order Specific Question:   Reason for Exam (SYMPTOM  OR DIAGNOSIS REQUIRED)    Answer:   Right knee pain    Order Specific Question:   Preferred imaging location?    Answer:   [facility]   TSH + free T4   CBC   CMP14+EGFR   PSA, total and free   Lipid Profile   Vitamin D (25 hydroxy)   Uric acid  Screenings: per lab orders Immunizations: Declines immunizations Anticipatory guidance/Risk factor reduction:  Recommendations per AVS.

## 2023-02-07 NOTE — Progress Notes (Signed)
John Jefferson. - 67 y.o. male MRN [id number]  Date of birth: 12/01/1955  Subjective Chief Complaint  Patient presents with   Annual Exam    HPI John Jefferson. Is a 67 y.o. male here today for annual exam.   Reports that he is doing well overall.  He is having pain in the R knee.  Previous surgery of this knee  He tries to stay moderately active.  He feels that diet has been pretty good.   Non-smoker.  Denies EtOH at this time  Review of Systems  Constitutional:  Negative for chills, fever, malaise/fatigue and weight loss.  HENT:  Negative for congestion, ear pain and sore throat.   Eyes:  Negative for blurred vision, double vision and pain.  Respiratory:  Negative for cough and shortness of breath.   Cardiovascular:  Negative for chest pain and palpitations.  Gastrointestinal:  Negative for abdominal pain, blood in stool, constipation, heartburn and nausea.  Genitourinary:  Negative for dysuria and urgency.  Musculoskeletal:  Negative for joint pain and myalgias.  Neurological:  Negative for dizziness and headaches.  Endo/Heme/Allergies:  Does not bruise/bleed easily.  Psychiatric/Behavioral:  Negative for depression. The patient is not nervous/anxious and does not have insomnia.     No Known Allergies  Past Medical History:  Diagnosis Date   Anxiety    CAD (coronary artery disease)    Cervical strain 11/21/2014   Cervicogenic headache 11/21/2014   Depression    Fibromyalgia    GERD (gastroesophageal reflux disease)    Gout 07/09/2014   Hyperlipidemia    Hypothyroidism 07/09/2014   Memory difficulty 02/17/2015   S/P CABG x 3 07/09/2014    Past Surgical History:  Procedure Laterality Date   BACK SURGERY     CATARACT EXTRACTION Bilateral    CERVICAL FUSION     CORONARY ARTERY BYPASS GRAFT  March 2015   Performed in [facility]   KNEE SURGERY     ROTATOR CUFF REPAIR      Social History   Socioeconomic History   Marital status: Married    Spouse name:  Victorino Dike   Number of children: 4   Years of education: Master's   Highest education level: Master's degree (e.g., MA, MS, MEng, MEd, MSW, MBA)  Occupational History   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  Tobacco Use   Smoking status: Former    Current packs/day: 0.00    Types: Cigarettes    Quit date: 04/12/1985    Years since quitting: 37.8   Smokeless tobacco: Never  Vaping Use   Vaping status: Never Used  Substance and Sexual Activity   Alcohol use: No    Alcohol/week: 0.0 standard drinks of alcohol   Drug use: No   Sexual activity: Not Currently    Partners: Female  Other Topics Concern   Not on file  Social History Narrative   Lives with his wife. He enjoys remote control sailing.   Social Determinants of Health   Financial Resource Strain: Low Risk  (10/25/2022)   Overall Financial Resource Strain (CARDIA)    Difficulty of Paying Living Expenses: Not very hard  Food Insecurity: No Food Insecurity (10/25/2022)   Hunger Vital Sign    Worried About Running Out of Food in the Last Year: Never true    Ran Out of Food in the Last Year: Never true  Transportation Needs: No Transportation Needs (10/25/2022)   PRAPARE - Transportation  Lack of Transportation (Medical): No    Lack of Transportation (Non-Medical): No  Physical Activity: Insufficiently Active (10/25/2022)   Exercise Vital Sign    Days of Exercise per Week: 3 days    Minutes of Exercise per Session: 20 min  Stress: No Stress Concern Present (10/25/2022)   [employer] of Occupational Health - Occupational Stress Questionnaire    Feeling of Stress : Only a little  Social Connections: Socially Integrated (10/25/2022)   Social Connection and Isolation Panel [NHANES]    Frequency of Communication with Friends and Family: Three times a week    Frequency of Social Gatherings with Friends and Family: Three times a week    Attends Religious Services: More than 4 times per year     Active Member of Clubs or Organizations: Yes    Attends Engineer, structural: More than 4 times per year    Marital Status: Married    Family History  Problem Relation Age of Onset   Heart attack Father    Stroke Mother    Thyroid disease Mother    Breast cancer Sister    Thyroid disease Brother    Thyroid disease Sister    Thyroid disease Sister     Health Maintenance  Topic Date Due   Medicare Annual Wellness (AWV)  03/23/2023   Zoster Vaccines- Shingrix (1 of 2) 05/10/2023 (Originally 12/05/1974)   COVID-19 Vaccine (3 - Moderna risk series) 05/14/2023 (Originally 07/21/2019)   INFLUENZA VACCINE  07/11/2023 (Originally 11/11/2022)   DTaP/Tdap/Td (2 - Td or Tdap) 03/17/2026   Colonoscopy  06/28/2026   Pneumonia Vaccine 42+ Years old  Completed   Hepatitis C Screening  Completed   HPV VACCINES  Aged Out     ----------------------------------------------------------------------------------------------------------------------------------------------------------------------------------------------------------------- Physical Exam BP 116/75 (BP Location: Left Arm, Patient Position: Sitting, Cuff Size: Normal)   Pulse 72   Ht 5\' 10"  (1.778 m)   Wt 198 lb (89.8 kg)   SpO2 95%   BMI 28.41 kg/m   Physical Exam Constitutional:      General: He is not in acute distress. HENT:     Head: Normocephalic and atraumatic.     Right Ear: Tympanic membrane and external ear normal.     Left Ear: Tympanic membrane and external ear normal.  Eyes:     General: No scleral icterus. Neck:     Thyroid: No thyromegaly.  Cardiovascular:     Rate and Rhythm: Normal rate and regular rhythm.     Heart sounds: Normal heart sounds.  Pulmonary:     Effort: Pulmonary effort is normal.     Breath sounds: Normal breath sounds.  Abdominal:     General: Bowel sounds are normal. There is no distension.     Palpations: Abdomen is soft.     Tenderness: There is no abdominal tenderness. There is  no guarding.  Musculoskeletal:     Cervical back: Normal range of motion.  Lymphadenopathy:     Cervical: No cervical adenopathy.  Skin:    General: Skin is warm and dry.     Findings: No rash.  Neurological:     Mental Status: He is alert and oriented to person, place, and time.     Cranial Nerves: No cranial nerve deficit.     Motor: No abnormal muscle tone.  Psychiatric:        Mood and Affect: Mood normal.        Behavior: Behavior normal.     ------------------------------------------------------------------------------------------------------------------------------------------------------------------------------------------------------------------- Assessment and Plan  Well adult exam Well adult Orders Placed This Encounter  Procedures   DG Knee Complete 4 Views Right    Standing Status:   Future    Standing Expiration Date:   02/07/2024    Order Specific Question:   Reason for Exam (SYMPTOM  OR DIAGNOSIS REQUIRED)    Answer:   Right knee pain    Order Specific Question:   Preferred imaging location?    Answer:   [facility]   TSH + free T4   CBC   CMP14+EGFR   PSA, total and free   Lipid Profile   Vitamin D (25 hydroxy)   Uric acid  Screenings: per lab orders Immunizations: Declines immunizations Anticipatory guidance/Risk factor reduction:  Recommendations per AVS.    No orders of the defined types were placed in this encounter.   No follow-ups on file.    This visit occurred during the SARS-CoV-2 public health emergency.  Safety protocols were in place, including screening questions prior to the visit, additional usage of staff PPE, and extensive cleaning of exam room while observing appropriate contact time as indicated for disinfecting solutions.

## 2023-02-07 NOTE — Patient Instructions (Signed)
Preventive Care 65 Years and Older, Male Preventive care refers to lifestyle choices and visits with your health care provider that can promote health and wellness. Preventive care visits are also called wellness exams. What can I expect for my preventive care visit? Counseling During your preventive care visit, your health care provider may ask about your: Medical history, including: Past medical problems. Family medical history. History of falls. Current health, including: Emotional well-being. Home life and relationship well-being. Sexual activity. Memory and ability to understand (cognition). Lifestyle, including: Alcohol, nicotine or tobacco, and drug use. Access to firearms. Diet, exercise, and sleep habits. Work and work environment. Sunscreen use. Safety issues such as seatbelt and bike helmet use. Physical exam Your health care provider will check your: Height and weight. These may be used to calculate your BMI (body mass index). BMI is a measurement that tells if you are at a healthy weight. Waist circumference. This measures the distance around your waistline. This measurement also tells if you are at a healthy weight and may help predict your risk of certain diseases, such as type 2 diabetes and high blood pressure. Heart rate and blood pressure. Body temperature. Skin for abnormal spots. What immunizations do I need?  Vaccines are usually given at various ages, according to a schedule. Your health care provider will recommend vaccines for you based on your age, medical history, and lifestyle or other factors, such as travel or where you work. What tests do I need? Screening Your health care provider may recommend screening tests for certain conditions. This may include: Lipid and cholesterol levels. Diabetes screening. This is done by checking your blood sugar (glucose) after you have not eaten for a while (fasting). Hepatitis C test. Hepatitis B test. HIV (human  immunodeficiency virus) test. STI (sexually transmitted infection) testing, if you are at risk. Lung cancer screening. Colorectal cancer screening. Prostate cancer screening. Abdominal aortic aneurysm (AAA) screening. You may need this if you are a current or former smoker. Talk with your health care provider about your test results, treatment options, and if necessary, the need for more tests. Follow these instructions at home: Eating and drinking  Eat a diet that includes fresh fruits and vegetables, whole grains, lean protein, and low-fat dairy products. Limit your intake of foods with high amounts of sugar, saturated fats, and salt. Take vitamin and mineral supplements as recommended by your health care provider. Do not drink alcohol if your health care provider tells you not to drink. If you drink alcohol: Limit how much you have to 0-2 drinks a day. Know how much alcohol is in your drink. In the [place] one drink equals one 12 oz bottle of beer (355 mL), one 5 oz glass of wine (148 mL), or one 1 oz glass of hard liquor (44 mL). Lifestyle Brush your teeth every morning and night with fluoride toothpaste. Floss one time each day. Exercise for at least 30 minutes 5 or more days each week. Do not use any products that contain nicotine or tobacco. These products include cigarettes, chewing tobacco, and vaping devices, such as e-cigarettes. If you need help quitting, ask your health care provider. Do not use drugs. If you are sexually active, practice safe sex. Use a condom or other form of protection to prevent STIs. Take aspirin only as told by your health care provider. Make sure that you understand how much to take and what form to take. Work with your health care provider to find out whether it is safe   and beneficial for you to take aspirin daily. Ask your health care provider if you need to take a cholesterol-lowering medicine (statin). Find healthy ways to manage stress, such  as: Meditation, yoga, or listening to music. Journaling. Talking to a trusted person. Spending time with friends and family. Safety Always wear your seat belt while driving or riding in a vehicle. Do not drive: If you have been drinking alcohol. Do not ride with someone who has been drinking. When you are tired or distracted. While texting. If you have been using any mind-altering substances or drugs. Wear a helmet and other protective equipment during sports activities. If you have firearms in your house, make sure you follow all gun safety procedures. Minimize exposure to UV radiation to reduce your risk of skin cancer. What's next? Visit your health care provider once a year for an annual wellness visit. Ask your health care provider how often you should have your eyes and teeth checked. Stay up to date on all vaccines. This information is not intended to replace advice given to you by your health care provider. Make sure you discuss any questions you have with your health care provider. Document Revised: 09/24/2020 Document Reviewed: 09/24/2020 Elsevier Patient Education  2024 [employer]  

## 2023-02-08 LAB — CMP14+EGFR
ALT: 36 [IU]/L (ref 0–44)
AST: 36 [IU]/L (ref 0–40)
Albumin: 4.6 g/dL (ref 3.9–4.9)
Alkaline Phosphatase: 101 [IU]/L (ref 44–121)
BUN/Creatinine Ratio: 12 (ref 10–24)
BUN: 13 mg/dL (ref 8–27)
Bilirubin Total: 0.6 mg/dL (ref 0.0–1.2)
CO2: 19 mmol/L — ABNORMAL LOW (ref 20–29)
Calcium: 9.8 mg/dL (ref 8.6–10.2)
Chloride: 107 mmol/L — ABNORMAL HIGH (ref 96–106)
Creatinine, Ser: 1.07 mg/dL (ref 0.76–1.27)
Globulin, Total: 2.3 g/dL (ref 1.5–4.5)
Glucose: 112 mg/dL — ABNORMAL HIGH (ref 70–99)
Potassium: 4.6 mmol/L (ref 3.5–5.2)
Sodium: 144 mmol/L (ref 134–144)
Total Protein: 6.9 g/dL (ref 6.0–8.5)
eGFR: 75 mL/min/{1.73_m2} (ref >59)

## 2023-02-08 LAB — PSA, TOTAL AND FREE
PSA, Free Pct: 20 %
PSA, Free: 0.16 ng/mL
Prostate Specific Ag, Serum: 0.8 ng/mL (ref 0.0–4.0)

## 2023-02-08 LAB — VITAMIN D 25 HYDROXY (VIT D DEFICIENCY, FRACTURES): Vit D, 25-Hydroxy: 41.1 ng/mL (ref 30.0–100.0)

## 2023-02-08 LAB — TSH+FREE T4
Free T4: 1.22 ng/dL (ref 0.82–1.77)
TSH: 1.24 u[IU]/mL (ref 0.450–4.500)

## 2023-02-08 LAB — CBC
Hematocrit: 49.7 % (ref 37.5–51.0)
Hemoglobin: 16.1 g/dL (ref 13.0–17.7)
MCH: 30.4 pg (ref 26.6–33.0)
MCHC: 32.4 g/dL (ref 31.5–35.7)
MCV: 94 fL (ref 79–97)
Platelets: 226 10*3/uL (ref 150–450)
RBC: 5.29 x10E6/uL (ref 4.14–5.80)
RDW: 13 % (ref 11.6–15.4)
WBC: 9 10*3/uL (ref 3.4–10.8)

## 2023-02-08 LAB — LIPID PANEL
Chol/HDL Ratio: 2.8 ratio (ref 0.0–5.0)
Cholesterol, Total: 118 mg/dL (ref 100–199)
HDL: 42 mg/dL (ref >39)
LDL Chol Calc (NIH): 57 mg/dL (ref 0–99)
Triglycerides: 103 mg/dL (ref 0–149)
VLDL Cholesterol Cal: 19 mg/dL (ref 5–40)

## 2023-02-08 LAB — URIC ACID: Uric Acid: 6.5 mg/dL (ref 3.8–8.4)

## 2023-02-15 DIAGNOSIS — Z0100 Encounter for examination of eyes and vision without abnormal findings: Secondary | ICD-10-CM | POA: Diagnosis not present

## 2023-03-28 ENCOUNTER — Ambulatory Visit (INDEPENDENT_AMBULATORY_CARE_PROVIDER_SITE_OTHER): Payer: Medicare Other | Admitting: Family Medicine

## 2023-03-28 ENCOUNTER — Encounter: Payer: Self-pay | Admitting: Family Medicine

## 2023-03-28 VITALS — BP 117/70 | HR 60 | Temp 97.8°F | Resp 18 | Ht 70.0 in | Wt 198.7 lb

## 2023-03-28 DIAGNOSIS — Z0000 Encounter for general adult medical examination without abnormal findings: Secondary | ICD-10-CM

## 2023-03-28 NOTE — Progress Notes (Signed)
Subjective:   John Jefferson. is a 67 y.o. male who presents for Medicare Annual/Subsequent preventive examination.  Visit Complete: In person  Patient Medicare AWV questionnaire was completed by the patient on n/a; I have confirmed that all information answered by patient is correct and no changes since this date.  Cardiac Risk Factors include: advanced age (>27men, >62 women);male gender;dyslipidemia     Objective:    Today's Vitals   03/28/23 1049 03/28/23 1050  BP: 117/70   Pulse: 60   Resp: 18   Temp: 97.8 F (36.6 C)   SpO2: 96%   Weight: 198 lb 11.2 oz (90.1 kg)   Height: 5\' 10"  (1.778 m)   PainSc: 4  5    Body mass index is 28.51 kg/m.     03/28/2023   11:00 AM 03/22/2022   11:09 AM 03/20/2021   11:01 AM 05/14/2019   11:09 AM 11/06/2018   10:07 AM 12/02/2014    9:32 AM 11/21/2014    9:13 AM  Advanced Directives  Does Patient Have a Medical Advance Directive? Yes Yes Yes No Yes Yes Yes  Type of Estate agent of [place];Living will Living will;Healthcare Power of Attorney Living will;Healthcare Power of [employer] Power of [place];Living will Healthcare Power of [employer];Living will Healthcare Power of [place];Living will  Does patient want to make changes to medical advance directive? No - Patient declined No - Patient declined No - Patient declined  No - Patient declined    Copy of Healthcare Power of Attorney in Chart?  No - copy requested No - copy requested  No - copy requested No - copy requested   Would patient like information on creating a medical advance directive?    No - Patient declined       Current Medications (verified) Outpatient Encounter Medications as of 03/28/2023  Medication Sig   allopurinol (ZYLOPRIM) 300 MG tablet TAKE 1 TABLET BY MOUTH DAILY   aspirin EC 81 MG tablet Take 81 mg by mouth daily. Swallow whole.   atorvastatin (LIPITOR) 80 MG tablet TAKE 1 TABLET BY MOUTH DAILY   buPROPion (WELLBUTRIN XL)  150 MG 24 hr tablet Take 1 tablet (150 mg total) by mouth daily.   DULoxetine (CYMBALTA) 60 MG capsule Take 1 capsule (60 mg total) by mouth daily.   ezetimibe (ZETIA) 10 MG tablet TAKE 1 TABLET BY MOUTH DAILY   gabapentin (NEURONTIN) 300 MG capsule TAKE 2 CAPSULES(600 MG) BY MOUTH THREE TIMES DAILY   levothyroxine (SYNTHROID) 100 MCG tablet TAKE 1 TABLET BY MOUTH DAILY BEFORE BREAKFAST   pantoprazole (PROTONIX) 40 MG tablet TAKE 1 TABLET BY MOUTH DAILY   topiramate (TOPAMAX) 100 MG tablet Take 100 mg by mouth 2 (two) times daily.   No facility-administered encounter medications on file as of 03/28/2023.    Allergies (verified) Patient has no known allergies.   History: Past Medical History:  Diagnosis Date   Anxiety    CAD (coronary artery disease)    Cervical strain 11/21/2014   Cervicogenic headache 11/21/2014   Depression    Fibromyalgia    GERD (gastroesophageal reflux disease)    Gout 07/09/2014   Hyperlipidemia    Hypothyroidism 07/09/2014   Memory difficulty 02/17/2015   S/P CABG x 3 07/09/2014   Past Surgical History:  Procedure Laterality Date   BACK SURGERY     CATARACT EXTRACTION Bilateral    CERVICAL FUSION     CORONARY ARTERY BYPASS GRAFT  March 2015  Performed in [facility]   KNEE SURGERY     ROTATOR CUFF REPAIR     Family History  Problem Relation Age of Onset   Heart attack Father    Stroke Mother    Thyroid disease Mother    Breast cancer Sister    Thyroid disease Brother    Thyroid disease Sister    Thyroid disease Sister    Social History   Socioeconomic History   Marital status: Married    Spouse name: John Jefferson   Number of children: 4   Years of education: Master's   Highest education level: Master's degree (e.g., MA, MS, MEng, MEd, MSW, MBA)  Occupational History   Occupation: [employer]    Comment: Airline pilot   Occupation: Retired.  Tobacco Use   Smoking status: Former    Current packs/day: 0.00    Types: Cigarettes    Quit date: 04/12/1985     Years since quitting: 37.9   Smokeless tobacco: Never  Vaping Use   Vaping status: Never Used  Substance and Sexual Activity   Alcohol use: No    Alcohol/week: 0.0 standard drinks of alcohol   Drug use: No   Sexual activity: Not Currently    Partners: Female  Other Topics Concern   Not on file  Social History Narrative   Lives with his wife. He enjoys remote control sailing.   Social Drivers of Corporate investment banker Strain: Low Risk  (03/28/2023)   Overall Financial Resource Strain (CARDIA)    Difficulty of Paying Living Expenses: Not hard at all  Food Insecurity: No Food Insecurity (03/28/2023)   Hunger Vital Sign    Worried About Running Out of Food in the Last Year: Never true    Ran Out of Food in the Last Year: Never true  Transportation Needs: No Transportation Needs (10/25/2022)   PRAPARE - Administrator, Civil Service (Medical): No    Lack of Transportation (Non-Medical): No  Physical Activity: Insufficiently Active (03/28/2023)   Exercise Vital Sign    Days of Exercise per Week: 2 days    Minutes of Exercise per Session: 30 min  Stress: No Stress Concern Present (03/28/2023)   [employer] of Occupational Health - Occupational Stress Questionnaire    Feeling of Stress : Only a little  Social Connections: Socially Integrated (03/28/2023)   Social Connection and Isolation Panel [NHANES]    Frequency of Communication with Friends and Family: Twice a week    Frequency of Social Gatherings with Friends and Family: Twice a week    Attends Religious Services: More than 4 times per year    Active Member of [employer] or Organizations: Yes    Attends Engineer, structural: More than 4 times per year    Marital Status: Married    Tobacco Counseling Counseling given: Not Answered   Clinical Intake:  Pre-visit preparation completed: No  Pain : 0-10 Pain Score: 5  Pain Type: Chronic pain Pain Radiating Towards: radiates to both  shoulders Pain Descriptors / Indicators: Constant Pain Onset: More than a month ago Pain Frequency: Intermittent Pain Relieving Factors: tolerates it Effect of Pain on Daily Activities: no  Pain Relieving Factors: tolerates it  BMI - recorded: 28.5 Nutritional Status: BMI 25 -29 Overweight Nutritional Risks: None Diabetes: No  How often do you need to have someone help you when you read instructions, pamphlets, or other written materials from your doctor or pharmacy?: 1 - Never What is the last grade level  you completed in school?: 17  Interpreter Needed?: No      Activities of Daily Living    03/28/2023   10:53 AM  In your present state of health, do you have any difficulty performing the following activities:  Hearing? 1  Comment uses hearing aid  Vision? 0  Difficulty concentrating or making decisions? 1  Comment concentrating and remembering  Walking or climbing stairs? 0  Dressing or bathing? 0  Doing errands, shopping? 0  Preparing Food and eating ? N  Using the Toilet? N  In the past six months, have you accidently leaked urine? N  Do you have problems with loss of bowel control? N  Managing your Medications? N  Managing your Finances? N  Housekeeping or managing your Housekeeping? N    Patient Care Team: Everrett Coombe, DO as PCP - General (Family Medicine) Jens Som Madolyn Frieze, MD as PCP - Cardiology (Cardiology) Allena Katz, Dr. Pain management    Indicate any recent Medical Services you may have received from other than [facility] providers in the past year (date may be approximate).     Assessment:   This is a routine wellness examination for Cha[place]in.  Hearing/Vision screen Hearing Screening - Comments:: Not tested, wears hearing aid, grossly intact.  Vision Screening - Comments:: Did not test, grossly intact. No issues    Goals Addressed             This Visit's Progress    Mobility and Independence Optimized       Evidence-based guidance:  Refer to  physical therapy or occupational therapy for assessment and individualized program.  Provide therapy that may include functional task training, balance, active or passive exercise, spasticity management, assistive device training, cardiorespiratory fitness, Pilates and telerehabilitation services.  Identify barriers to participation in therapy or exercise such as pain with activity, anticipated or imagined pain, transportation, depression or fear.  Work with patient and family to develop self-management plan to remove barriers.  Refer to speech/language pathologist to assess and treat speech/language deficit and swallowing impairment.  Periodically review ability to perform activities of daily living and required amount of assistance needed for safety, optimal independence and self-care.  Encourage appropriate vocational or educational counseling for re-entering the community, workplace or school; consider a driving evaluation.  Assist patient to advocate for necessary adaptations to the work or school environment.  Refer to employer for information about FMLA (Family and Medical Leave Act), Short or Long-Term Disability and options for adjustments to work schedule, assignment or hours.   Notes:       Depression Screen    03/28/2023   10:59 AM 02/07/2023    9:39 AM 02/07/2023    9:38 AM 10/26/2022    9:25 AM 03/22/2022   11:08 AM 11/19/2021   11:54 AM 08/12/2021    9:47 AM  PHQ 2/9 Scores  PHQ - 2 Score 0 0 0 0 0 2 2  PHQ- 9 Score 0 4  4  6 6     Fall Risk    03/28/2023   11:01 AM 02/07/2023    9:37 AM 09/07/2022   11:31 AM 03/22/2022   11:08 AM 08/12/2021    8:59 AM  Fall Risk   Falls in the past year? 0 0 0 0 0  Number falls in past yr: 0 0 0 0 0  Injury with Fall? 0 0 0 0 0  Risk for fall due to : No Fall Risks No Fall Risks No Fall Risks No Fall Risks  No Fall Risks  Follow up  Falls evaluation completed Falls evaluation completed Falls evaluation completed Falls evaluation  completed    MEDICARE RISK AT HOME: Medicare Risk at Home Any stairs in or around the home?: Yes If so, are there any without handrails?: No Home free of loose throw rugs in walkways, pet beds, electrical cords, etc?: Yes Adequate lighting in your home to reduce risk of falls?: Yes Life alert?: No Use of a cane, walker or w/c?: No Grab bars in the bathroom?: No Shower chair or bench in shower?: Yes Elevated toilet seat or a handicapped toilet?: No  TIMED UP AND GO:  Was the test performed?  Yes  Length of time to ambulate 10 feet: 11 sec Gait steady and fast without use of assistive device    Cognitive Function:    06/09/2015    8:21 AM 02/17/2015   12:19 PM 11/21/2014    9:24 AM  MMSE - Mini Mental State Exam  Orientation to time 4 5 5   Orientation to Place 5 5 5   Registration 3 3 3   Attention/ Calculation 5 5 5   Recall 3 2 3   Language- name 2 objects 2 2 2   Language- repeat 1 1 1   Language- follow 3 step command 3 3 3   Language- read & follow direction 1 1 1   Write a sentence 1 1 1   Copy design 1 1 1   Total score 29 29 30         03/28/2023   11:02 AM 03/22/2022   11:12 AM 03/20/2021   11:05 AM 11/06/2018   10:12 AM  6CIT Screen  What Year? 0 points 0 points 0 points 0 points  What month? 0 points 0 points 0 points 0 points  What time? 0 points 0 points 0 points 0 points  Count back from 20 0 points 0 points 0 points 0 points  Months in reverse 0 points 0 points 0 points 2 points  Repeat phrase 0 points 0 points 0 points 0 points  Total Score 0 points 0 points 0 points 2 points    Immunizations Immunization History  Administered Date(s) Administered   Influenza,inj,Quad PF,6+ Mos 12/30/2014, 03/17/2016, 03/15/2018, 02/06/2019   Influenza-Unspecified 02/11/2020, 01/24/2021   Moderna Sars-Covid-2 Vaccination 05/25/2019, 06/23/2019   PNEUMOCOCCAL CONJUGATE-20 01/24/2021   Tdap 03/17/2016   Zoster, Live 08/26/2015    TDAP status: Up to date  Flu Vaccine  status: Declined, Education has been provided regarding the importance of this vaccine but patient still declined. Advised may receive this vaccine at local pharmacy or Health Dept. Aware to provide a copy of the vaccination record if obtained from local pharmacy or Health Dept. Verbalized acceptance and understanding.  Pneumococcal vaccine status: Up to date  Covid-19 vaccine status: Declined, Education has been provided regarding the importance of this vaccine but patient still declined. Advised may receive this vaccine at local pharmacy or Health Dept.or vaccine clinic. Aware to provide a copy of the vaccination record if obtained from local pharmacy or Health Dept. Verbalized acceptance and understanding.  Qualifies for Shingles Vaccine? Yes   Zostavax completed Yes   Shingrix Completed?: No.    Education has been provided regarding the importance of this vaccine. Patient has been advised to call insurance company to determine out of pocket expense if they have not yet received this vaccine. Advised may also receive vaccine at local pharmacy or Health Dept. Verbalized acceptance and understanding.  Screening Tests Health Maintenance  Topic Date Due  Zoster Vaccines- Shingrix (1 of 2) 05/10/2023 (Originally 12/05/1974)   COVID-19 Vaccine (3 - Moderna risk series) 05/14/2023 (Originally 07/21/2019)   INFLUENZA VACCINE  07/11/2023 (Originally 11/11/2022)   Medicare Annual Wellness (AWV)  03/27/2024   DTaP/Tdap/Td (2 - Td or Tdap) 03/17/2026   Colonoscopy  06/28/2026   Pneumonia Vaccine 5+ Years old  Completed   Hepatitis C Screening  Completed   HPV VACCINES  Aged Out    Health Maintenance  There are no preventive care reminders to display for this patient.   Colorectal cancer screening: Type of screening: Colonoscopy. Completed 10/27/2015. Repeat every 10 years  Lung Cancer Screening: (Low Dose CT Chest recommended if Age 98-80 years, 20 pack-year currently smoking OR have quit w/in  15years.) does not qualify.   Lung Cancer Screening Referral: n/a   Additional Screening:  Hepatitis C Screening: does qualify; Completed 08/26/2015  Vision Screening: Recommended annual ophthalmology exams for early detection of glaucoma and other disorders of the eye. Is the patient up to date with their annual eye exam?  Yes  Who is the provider or what is the name of the office in which the patient attends annual eye exams? [facility]  If pt is not established with a provider, would they like to be referred to a provider to establish care? No .   Dental Screening: Recommended annual dental exams for proper oral hygiene  Diabetic Foot Exam: n/a   Community Resource Referral / Chronic Care Management: CRR required this visit?  No   CCM required this visit?  No     Plan:     I have personally reviewed and noted the following in the patient's chart:   Medical and social history Use of alcohol, tobacco or illicit drugs  Current medications and supplements including opioid prescriptions. Patient is not currently taking opioid prescriptions. Functional ability and status Nutritional status Physical activity Advanced directives List of other physicians Hospitalizations, surgeries, and ER visits in previous 12 months: ED visit for laceration on tip of nose about 6 months ago.  Vitals Screenings to include cognitive, depression, and falls Referrals and appointments  In addition, I have reviewed and discussed with patient certain preventive protocols, quality metrics, and best practice recommendations. A written personalized care plan for preventive services as well as general preventive health recommendations were provided to patient.     Novella Olive, FNP   03/28/2023   After Visit Summary: (In Person-Declined) Patient declined AVS at this time.  Follow-up with PCP as scheduled. Consider getting Shingrix vaccine at local pharmacy.

## 2023-03-30 ENCOUNTER — Other Ambulatory Visit: Payer: Self-pay | Admitting: Family Medicine

## 2023-04-25 DIAGNOSIS — H59813 Chorioretinal scars after surgery for detachment, bilateral: Secondary | ICD-10-CM | POA: Diagnosis not present

## 2023-04-25 DIAGNOSIS — H35373 Puckering of macula, bilateral: Secondary | ICD-10-CM | POA: Diagnosis not present

## 2023-04-25 DIAGNOSIS — Z8669 Personal history of other diseases of the nervous system and sense organs: Secondary | ICD-10-CM | POA: Diagnosis not present

## 2023-05-09 ENCOUNTER — Ambulatory Visit: Payer: Self-pay | Admitting: Family Medicine

## 2023-05-09 ENCOUNTER — Ambulatory Visit (INDEPENDENT_AMBULATORY_CARE_PROVIDER_SITE_OTHER): Payer: No Typology Code available for payment source | Admitting: Family Medicine

## 2023-05-09 ENCOUNTER — Encounter: Payer: Self-pay | Admitting: Family Medicine

## 2023-05-09 VITALS — BP 105/69 | HR 94 | Temp 98.6°F | Resp 18 | Ht 70.0 in | Wt 199.6 lb

## 2023-05-09 DIAGNOSIS — U071 COVID-19: Secondary | ICD-10-CM

## 2023-05-09 DIAGNOSIS — R509 Fever, unspecified: Secondary | ICD-10-CM

## 2023-05-09 DIAGNOSIS — R051 Acute cough: Secondary | ICD-10-CM

## 2023-05-09 LAB — POCT INFLUENZA A/B
Influenza A, POC: NEGATIVE
Influenza B, POC: NEGATIVE

## 2023-05-09 LAB — POC COVID19 BINAXNOW: SARS Coronavirus 2 Ag: POSITIVE — AB

## 2023-05-09 MED ORDER — BENZONATATE 200 MG PO CAPS: 200.0000 mg | ORAL_CAPSULE | Freq: Two times a day (BID) | ORAL | 0 refills | Status: DC | PRN

## 2023-05-09 MED ORDER — NIRMATRELVIR/RITONAVIR (PAXLOVID)TABLET: 3.0000 | ORAL_TABLET | Freq: Two times a day (BID) | ORAL | 0 refills | Status: AC

## 2023-05-09 NOTE — Progress Notes (Signed)
Acute Office Visit  Subjective:     Patient ID: John Jefferson., male    DOB: January 09, 1956, 68 y.o.   MRN: [id number]  Chief Complaint  Patient presents with   Cough    Patient states that his symptoms started Saturday with coughing, body aches, and fever. Patient also has complaints of ear pain    HPI Patient is in today for acute visit.  Pt reports he started having chills on Saturday with fever of 100. He has also had cough that has worsened since Saturday. He also has ringing in his ears. He has tried cough drops for his symptoms. He has sick contacts with his wife. He has had some chest pain with coughing and SOB. Cough is worse at night. No allergies reported by pt.   Review of Systems  Constitutional:  Positive for chills, fever and malaise/fatigue.  Respiratory:  Positive for cough and shortness of breath.   All other systems reviewed and are negative.       Objective:    BP 105/69   Pulse 94   Temp 98.6 F (37 C) (Oral)   Resp 18   Ht 5\' 10"  (1.778 m)   Wt 199 lb 9.6 oz (90.5 kg)   SpO2 95%   BMI 28.64 kg/m  BP Readings from Last 3 Encounters:  05/09/23 105/69  03/28/23 117/70  02/07/23 116/75      Physical Exam Vitals and nursing note reviewed.  Constitutional:      Appearance: Normal appearance. He is normal weight.  HENT:     Head: Normocephalic and atraumatic.     Right Ear: Tympanic membrane, ear canal and external ear normal.     Left Ear: Tympanic membrane, ear canal and external ear normal.     Nose: Nose normal.     Mouth/Throat:     Mouth: Mucous membranes are moist.     Pharynx: Oropharynx is clear.  Eyes:     Conjunctiva/sclera: Conjunctivae normal.     Pupils: Pupils are equal, round, and reactive to light.  Cardiovascular:     Rate and Rhythm: Normal rate and regular rhythm.     Pulses: Normal pulses.     Heart sounds: Normal heart sounds.  Pulmonary:     Effort: Pulmonary effort is normal.     Breath sounds: Normal breath  sounds.  Skin:    General: Skin is warm.     Capillary Refill: Capillary refill takes less than 2 seconds.  Neurological:     General: No focal deficit present.     Mental Status: He is alert and oriented to person, place, and time. Mental status is at baseline.  Psychiatric:        Mood and Affect: Mood normal.        Behavior: Behavior normal.        Thought Content: Thought content normal.        Judgment: Judgment normal.    Results for orders placed or performed in visit on 05/09/23  POC COVID-19 BinaxNow  Result Value Ref Range   SARS Coronavirus 2 Ag Negative Negative  POCT Influenza A/B  Result Value Ref Range   Influenza A, POC Negative Negative   Influenza B, POC Negative Negative        Assessment & Plan:   Problem List Items Addressed This Visit   None Visit Diagnoses       Acute cough    -  Primary   Relevant Orders  POC COVID-19 BinaxNow (Completed)   POCT Influenza A/B (Completed)     Fever, unspecified fever cause       Relevant Orders   POC COVID-19 BinaxNow (Completed)   POCT Influenza A/B (Completed)     COVID-19       Relevant Medications   nirmatrelvir/ritonavir (PAXLOVID) 20 x 150 MG & 10 x 100MG  TABS   benzonatate (TESSALON) 200 MG capsule       Meds ordered this encounter  Medications   nirmatrelvir/ritonavir (PAXLOVID) 20 x 150 MG & 10 x 100MG  TABS    Sig: Take 3 tablets by mouth 2 (two) times daily for 5 days. (Take nirmatrelvir 150 mg two tablets twice daily for 5 days and ritonavir 100 mg one tablet twice daily for 5 days) Patient GFR is 75    Dispense:  30 tablet    Refill:  0   benzonatate (TESSALON) 200 MG capsule    Sig: Take 1 capsule (200 mg total) by mouth 2 (two) times daily as needed for cough.    Dispense:  20 capsule    Refill:  0  Covid +. Sent Paxlovid in for 5 days along with Tessalon perles. If symptoms worsens such as chest pains or SOB, go to ER.    No follow-ups on file.  Suzan Slick, MD

## 2023-05-09 NOTE — Telephone Encounter (Signed)
  Chief Complaint: cough Symptoms: cough, fever Frequency: 3 days Pertinent Negatives: Patient denies CP, nausea, vomiting, diarrhea Disposition: [] ED /[] Urgent Care (no appt availability in office) / [x] Appointment(In office/virtual)/ []  [facility]/ [] Home Care/ [] Refused Recommended Disposition /[] [facility]/ []  Follow-up with PCP Additional Notes: Patient called reporting productive cough with yellow phlegm and fever (now controlled) x 3 days. Patient states that he has been in bed for two days and experiencing mild SOB during rest. Patient is speaking in full sentences during call. NAD noted. States his wife experienced the same symptoms last week. Per protocol, patient to be evaluated within 4 hours. No availability in clinic, patient agreeable to being seen in other office. Scheduled at [facility] for today at 1410.   Reason for Disposition  [1] MILD difficulty breathing (e.g., minimal/no SOB at rest, SOB with walking, pulse <100) AND [2] still present when not coughing  Answer Assessment - Initial Assessment Questions 1. ONSET: "When did the cough begin?"      3 days ago 2. SEVERITY: "How bad is the cough today?"      Coughing all night, states it builds up and then he has to cough the phlegm up, approx every 10-15 minutes 3. SPUTUM: "Describe the color of your sputum" (none, dry cough; clear, white, yellow, green)     Yellow, states it is half a teaspoon or less. 4. HEMOPTYSIS: "Are you coughing up any blood?" If so ask: "How much?" (flecks, streaks, tablespoons, etc.)     Denies 5. DIFFICULTY BREATHING: "Are you having difficulty breathing?" If Yes, ask: "How bad is it?" (e.g., mild, moderate, severe)    - MILD: No SOB at rest, mild SOB with walking, speaks normally in sentences, can lie down, no retractions, pulse < 100.    - MODERATE: SOB at rest, SOB with minimal exertion and prefers to sit, cannot lie down flat, speaks in phrases, mild retractions, audible  wheezing, pulse 100-120.    - SEVERE: Very SOB at rest, speaks in single words, struggling to breathe, sitting hunched forward, retractions, pulse > 120      Reports SOB at rest, patient is speaking in full sentences. 6. FEVER: "Do you have a fever?" If Yes, ask: "What is your temperature, how was it measured, and when did it start?"     Reports fever of 100F, checked on forehead, states it is resolved now. 7. CARDIAC HISTORY: "Do you have any history of heart disease?" (e.g., heart attack, congestive heart failure)      CAD, triple bypass 8. LUNG HISTORY: "Do you have any history of lung disease?"  (e.g., pulmonary embolus, asthma, emphysema)     Denies 9. PE RISK FACTORS: "Do you have a history of blood clots?" (or: recent major surgery, recent prolonged travel, bedridden)     Denies 10. OTHER SYMPTOMS: "Do you have any other symptoms?" (e.g., runny nose, wheezing, chest pain)       Runny nose  12. TRAVEL: "Have you traveled out of the country in the last month?" (e.g., travel history, exposures)       Denies  Protocols used: Cough - Acute Productive-A-AH

## 2023-05-18 ENCOUNTER — Encounter: Payer: Self-pay | Admitting: Family Medicine

## 2023-05-18 ENCOUNTER — Ambulatory Visit (INDEPENDENT_AMBULATORY_CARE_PROVIDER_SITE_OTHER): Payer: No Typology Code available for payment source

## 2023-05-18 ENCOUNTER — Ambulatory Visit (INDEPENDENT_AMBULATORY_CARE_PROVIDER_SITE_OTHER): Payer: No Typology Code available for payment source | Admitting: Family Medicine

## 2023-05-18 VITALS — BP 131/79 | HR 86 | Ht 70.0 in | Wt 199.0 lb

## 2023-05-18 DIAGNOSIS — J208 Acute bronchitis due to other specified organisms: Secondary | ICD-10-CM | POA: Diagnosis not present

## 2023-05-18 DIAGNOSIS — B9689 Other specified bacterial agents as the cause of diseases classified elsewhere: Secondary | ICD-10-CM | POA: Insufficient documentation

## 2023-05-18 DIAGNOSIS — R0600 Dyspnea, unspecified: Secondary | ICD-10-CM | POA: Diagnosis not present

## 2023-05-18 DIAGNOSIS — U071 COVID-19: Secondary | ICD-10-CM

## 2023-05-18 MED ORDER — AZITHROMYCIN 250 MG PO TABS: ORAL_TABLET | ORAL | 0 refills | Status: AC

## 2023-05-18 MED ORDER — PROMETHAZINE-DM 6.25-15 MG/5ML PO SYRP: 5.0000 mL | ORAL_SOLUTION | Freq: Four times a day (QID) | ORAL | 0 refills | Status: DC | PRN

## 2023-05-18 MED ORDER — PREDNISONE 20 MG PO TABS: 20.0000 mg | ORAL_TABLET | Freq: Two times a day (BID) | ORAL | 0 refills | Status: DC

## 2023-05-18 NOTE — Assessment & Plan Note (Signed)
 Recently had COVID with some improvement, now with worsening symptoms.  Possibly rebound after completion of Paxlovid  however I do have some concerns about pneumonia versus bronchitis.  Chest x-ray ordered today.  Adding course of azithromycin  and prednisone .  Promethazine  DM cough syrup as needed.  Red flags reviewed.

## 2023-05-18 NOTE — Progress Notes (Signed)
 John Jefferson. - 68 y.o. male MRN [id number]  Date of birth: 06-11-1955  Subjective Chief Complaint  Patient presents with   Cough    HPI John Jefferson. Is a 68 y.o. male here today with complaint of cough and congestion.  Reports having COVID about 1.5 weeks.  He was seen in urgent care and treated with Paxlovid .  Reports that symptoms got a little better but worsened over the past few days.  He denies any continued fevers but does have some dyspnea and wheezing.  Cough is productive of clear to light yellow sputum.  Sputum is thick.  ROS:  A comprehensive ROS was completed and negative except as noted per HPI  No Known Allergies  Past Medical History:  Diagnosis Date   Anxiety    CAD (coronary artery disease)    Cervical strain 11/21/2014   Cervicogenic headache 11/21/2014   Depression    Fibromyalgia    GERD (gastroesophageal reflux disease)    Gout 07/09/2014   Hyperlipidemia    Hypothyroidism 07/09/2014   Memory difficulty 02/17/2015   S/P CABG x 3 07/09/2014    Past Surgical History:  Procedure Laterality Date   BACK SURGERY     CATARACT EXTRACTION Bilateral    CERVICAL FUSION     CORONARY ARTERY BYPASS GRAFT  March 2015   Performed in [facility]   KNEE SURGERY     ROTATOR CUFF REPAIR      Social History   Socioeconomic History   Marital status: Married    Spouse name: Delon   Number of children: 4   Years of education: Master's   Highest education level: Master's degree (e.g., MA, MS, MEng, MEd, MSW, MBA)  Occupational History   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  Tobacco Use   Smoking status: Former    Current packs/day: 0.00    Types: Cigarettes    Quit date: 04/12/1985    Years since quitting: 38.1   Smokeless tobacco: Never  Vaping Use   Vaping status: Never Used  Substance and Sexual Activity   Alcohol use: No    Alcohol/week: 0.0 standard drinks of alcohol   Drug use: No   Sexual activity: Not Currently     Partners: Female  Other Topics Concern   Not on file  Social History Narrative   Lives with his wife. He enjoys remote control sailing.   Social Drivers of Corporate Investment Banker Strain: Low Risk  (03/28/2023)   Overall Financial Resource Strain (CARDIA)    Difficulty of Paying Living Expenses: Not hard at all  Food Insecurity: No Food Insecurity (03/28/2023)   Hunger Vital Sign    Worried About Running Out of Food in the Last Year: Never true    Ran Out of Food in the Last Year: Never true  Transportation Needs: No Transportation Needs (10/25/2022)   PRAPARE - Administrator, Civil Service (Medical): No    Lack of Transportation (Non-Medical): No  Physical Activity: Insufficiently Active (03/28/2023)   Exercise Vital Sign    Days of Exercise per Week: 2 days    Minutes of Exercise per Session: 30 min  Stress: No Stress Concern Present (03/28/2023)   [employer] of Occupational Health - Occupational Stress Questionnaire    Feeling of Stress : Only a little  Social Connections: Socially Integrated (03/28/2023)   Social Connection and Isolation Panel [NHANES]    Frequency of Communication with Friends  and Family: Twice a week    Frequency of Social Gatherings with Friends and Family: Twice a week    Attends Religious Services: More than 4 times per year    Active Member of Clubs or Organizations: Yes    Attends Engineer, Structural: More than 4 times per year    Marital Status: Married    Family History  Problem Relation Age of Onset   Heart attack Father    Stroke Mother    Thyroid  disease Mother    Breast cancer Sister    Thyroid  disease Brother    Thyroid  disease Sister    Thyroid  disease Sister     Health Maintenance  Topic Date Due   Zoster Vaccines- Shingrix (1 of 2) 08/15/2023 (Originally 12/05/1974)   Medicare Annual Wellness (AWV)  03/27/2024   DTaP/Tdap/Td (2 - Td or Tdap) 03/17/2026   Colonoscopy  06/28/2026   Pneumonia  Vaccine 91+ Years old  Completed   Hepatitis C Screening  Completed   HPV VACCINES  Aged Out   INFLUENZA VACCINE  Discontinued   COVID-19 Vaccine  Discontinued     ----------------------------------------------------------------------------------------------------------------------------------------------------------------------------------------------------------------- Physical Exam BP 131/79 (BP Location: Right Arm, Patient Position: Sitting, Cuff Size: Normal)   Pulse 86   Ht 5' 10 (1.778 m)   Wt 199 lb (90.3 kg)   SpO2 96%   BMI 28.55 kg/m   Physical Exam Constitutional:      Appearance: Normal appearance.  HENT:     Head: Normocephalic and atraumatic.  Eyes:     General: No scleral icterus. Cardiovascular:     Rate and Rhythm: Normal rate and regular rhythm.  Pulmonary:     Effort: Pulmonary effort is normal.     Breath sounds: Normal breath sounds.  Musculoskeletal:     Cervical back: Neck supple.  Neurological:     Mental Status: He is alert.  Psychiatric:        Mood and Affect: Mood normal.        Behavior: Behavior normal.     ------------------------------------------------------------------------------------------------------------------------------------------------------------------------------------------------------------------- Assessment and Plan  Acute bacterial bronchitis Recently had COVID with some improvement, now with worsening symptoms.  Possibly rebound after completion of Paxlovid  however I do have some concerns about pneumonia versus bronchitis.  Chest x-ray ordered today.  Adding course of azithromycin  and prednisone .  Promethazine  DM cough syrup as needed.  Red flags reviewed.   Meds ordered this encounter  Medications   predniSONE  (DELTASONE ) 20 MG tablet    Sig: Take 1 tablet (20 mg total) by mouth 2 (two) times daily with a meal.    Dispense:  10 tablet    Refill:  0   azithromycin  (ZITHROMAX ) 250 MG tablet    Sig: Take 2  tablets on day 1, then 1 tablet daily on days 2 through 5    Dispense:  6 tablet    Refill:  0   promethazine -dextromethorphan (PROMETHAZINE -DM) 6.25-15 MG/5ML syrup    Sig: Take 5 mLs by mouth 4 (four) times daily as needed for cough.    Dispense:  118 mL    Refill:  0    No follow-ups on file.    This visit occurred during the SARS-CoV-2 public health emergency.  Safety protocols were in place, including screening questions prior to the visit, additional usage of staff PPE, and extensive cleaning of exam room while observing appropriate contact time as indicated for disinfecting solutions.

## 2023-05-19 ENCOUNTER — Other Ambulatory Visit: Payer: Self-pay | Admitting: Family Medicine

## 2023-05-20 DIAGNOSIS — F17211 Nicotine dependence, cigarettes, in remission: Secondary | ICD-10-CM | POA: Diagnosis not present

## 2023-05-20 DIAGNOSIS — E039 Hypothyroidism, unspecified: Secondary | ICD-10-CM | POA: Diagnosis not present

## 2023-05-20 DIAGNOSIS — Z6828 Body mass index (BMI) 28.0-28.9, adult: Secondary | ICD-10-CM | POA: Diagnosis not present

## 2023-05-20 DIAGNOSIS — F324 Major depressive disorder, single episode, in partial remission: Secondary | ICD-10-CM | POA: Diagnosis not present

## 2023-05-20 DIAGNOSIS — I2510 Atherosclerotic heart disease of native coronary artery without angina pectoris: Secondary | ICD-10-CM | POA: Diagnosis not present

## 2023-05-20 DIAGNOSIS — E785 Hyperlipidemia, unspecified: Secondary | ICD-10-CM | POA: Diagnosis not present

## 2023-05-20 DIAGNOSIS — E663 Overweight: Secondary | ICD-10-CM | POA: Diagnosis not present

## 2023-05-20 DIAGNOSIS — G43909 Migraine, unspecified, not intractable, without status migrainosus: Secondary | ICD-10-CM | POA: Diagnosis not present

## 2023-05-20 DIAGNOSIS — K219 Gastro-esophageal reflux disease without esophagitis: Secondary | ICD-10-CM | POA: Diagnosis not present

## 2023-05-20 DIAGNOSIS — Z008 Encounter for other general examination: Secondary | ICD-10-CM | POA: Diagnosis not present

## 2023-06-28 ENCOUNTER — Other Ambulatory Visit: Payer: Self-pay | Admitting: Family Medicine

## 2023-09-23 ENCOUNTER — Other Ambulatory Visit: Payer: Self-pay | Admitting: Family Medicine

## 2023-10-17 ENCOUNTER — Other Ambulatory Visit: Payer: Self-pay | Admitting: Family Medicine

## 2023-11-05 ENCOUNTER — Other Ambulatory Visit: Payer: Self-pay | Admitting: Family Medicine

## 2023-11-05 DIAGNOSIS — F321 Major depressive disorder, single episode, moderate: Secondary | ICD-10-CM

## 2023-12-26 DIAGNOSIS — H59813 Chorioretinal scars after surgery for detachment, bilateral: Secondary | ICD-10-CM | POA: Diagnosis not present

## 2023-12-26 DIAGNOSIS — Z8669 Personal history of other diseases of the nervous system and sense organs: Secondary | ICD-10-CM | POA: Diagnosis not present

## 2023-12-26 DIAGNOSIS — H35373 Puckering of macula, bilateral: Secondary | ICD-10-CM | POA: Diagnosis not present

## 2023-12-28 ENCOUNTER — Other Ambulatory Visit: Payer: Self-pay | Admitting: Family Medicine

## 2024-01-03 DIAGNOSIS — M549 Dorsalgia, unspecified: Secondary | ICD-10-CM | POA: Diagnosis not present

## 2024-01-03 DIAGNOSIS — M5116 Intervertebral disc disorders with radiculopathy, lumbar region: Secondary | ICD-10-CM | POA: Diagnosis not present

## 2024-01-03 DIAGNOSIS — Z1330 Encounter for screening examination for mental health and behavioral disorders, unspecified: Secondary | ICD-10-CM | POA: Diagnosis not present

## 2024-01-03 DIAGNOSIS — M479 Spondylosis, unspecified: Secondary | ICD-10-CM | POA: Diagnosis not present

## 2024-01-14 ENCOUNTER — Other Ambulatory Visit: Payer: Self-pay | Admitting: Family Medicine

## 2024-01-14 DIAGNOSIS — E7800 Pure hypercholesterolemia, unspecified: Secondary | ICD-10-CM

## 2024-01-16 DIAGNOSIS — M5116 Intervertebral disc disorders with radiculopathy, lumbar region: Secondary | ICD-10-CM | POA: Diagnosis not present

## 2024-01-16 NOTE — Telephone Encounter (Signed)
 Front Desk: Please contact pt to schedule appt with Dr. Alvia with fasting labs. Sending 30 day med refill for continuation. No additional refills. Thanks.

## 2024-01-30 DIAGNOSIS — M5416 Radiculopathy, lumbar region: Secondary | ICD-10-CM | POA: Diagnosis not present

## 2024-01-30 DIAGNOSIS — M47816 Spondylosis without myelopathy or radiculopathy, lumbar region: Secondary | ICD-10-CM | POA: Diagnosis not present

## 2024-02-12 ENCOUNTER — Other Ambulatory Visit: Payer: Self-pay | Admitting: Family Medicine

## 2024-02-12 DIAGNOSIS — E7800 Pure hypercholesterolemia, unspecified: Secondary | ICD-10-CM

## 2024-02-13 NOTE — Telephone Encounter (Signed)
 Pls contact pt to schedule HTN & Cholesterol appt with Dr. Alvia. Sending 30 day med refill. Thx.

## 2024-02-20 ENCOUNTER — Encounter: Payer: Self-pay | Admitting: Family Medicine

## 2024-02-20 ENCOUNTER — Ambulatory Visit: Admitting: Family Medicine

## 2024-02-20 VITALS — BP 113/78 | HR 81 | Ht 70.0 in | Wt 182.0 lb

## 2024-02-20 DIAGNOSIS — Z125 Encounter for screening for malignant neoplasm of prostate: Secondary | ICD-10-CM | POA: Diagnosis not present

## 2024-02-20 DIAGNOSIS — M1A9XX0 Chronic gout, unspecified, without tophus (tophi): Secondary | ICD-10-CM

## 2024-02-20 DIAGNOSIS — K219 Gastro-esophageal reflux disease without esophagitis: Secondary | ICD-10-CM

## 2024-02-20 DIAGNOSIS — E039 Hypothyroidism, unspecified: Secondary | ICD-10-CM | POA: Diagnosis not present

## 2024-02-20 DIAGNOSIS — E7800 Pure hypercholesterolemia, unspecified: Secondary | ICD-10-CM | POA: Diagnosis not present

## 2024-02-20 DIAGNOSIS — Z0000 Encounter for general adult medical examination without abnormal findings: Secondary | ICD-10-CM

## 2024-02-20 DIAGNOSIS — E559 Vitamin D deficiency, unspecified: Secondary | ICD-10-CM | POA: Diagnosis not present

## 2024-02-20 DIAGNOSIS — I2510 Atherosclerotic heart disease of native coronary artery without angina pectoris: Secondary | ICD-10-CM

## 2024-02-20 MED ORDER — PANTOPRAZOLE SODIUM 40 MG PO TBEC: 40.0000 mg | DELAYED_RELEASE_TABLET | Freq: Every day | ORAL | 2 refills | Status: AC

## 2024-02-20 MED ORDER — ATORVASTATIN CALCIUM 80 MG PO TABS: 80.0000 mg | ORAL_TABLET | Freq: Every day | ORAL | 3 refills | Status: AC

## 2024-02-20 MED ORDER — LEVOTHYROXINE SODIUM 100 MCG PO TABS: 100.0000 ug | ORAL_TABLET | Freq: Every day | ORAL | 3 refills | Status: DC

## 2024-02-20 MED ORDER — EZETIMIBE 10 MG PO TABS: 10.0000 mg | ORAL_TABLET | Freq: Every day | ORAL | 3 refills | Status: AC

## 2024-02-20 NOTE — Assessment & Plan Note (Signed)
 Lab Results  Component Value Date   LDLCALC 57 02/07/2023  Continue atorvastatin  and Zetia  at current dose.

## 2024-02-20 NOTE — Progress Notes (Signed)
 John Jefferson. - 68 y.o. male MRN [id number]  Date of birth: 11-Jul-1955  Subjective Chief Complaint  Patient presents with   Hypertension   Hyperlipidemia   Hypothyroidism    HPI John Jefferson. Is a 68 y.o. male here today for follow up visit.   He reports that he is doing well.   He continues on levothyroxine  for management of hypothyroidism.  Feels pretty good with this at current strength.   Mood is table with bupropion  and  duloxetine .  Also has chronic pain and is followed by ortho.  Remains on gabapentin  as well.  Overall stable at this time.   History of CAD s/p CABG.  Remains on atorvastatin  and zetia  and ASA.  Denies anginal symptoms, dyspnea on exertion, or palpitations.   ROS:  A comprehensive ROS was completed and negative except as noted per HPI  No Known Allergies  Past Medical History:  Diagnosis Date   Anxiety    CAD (coronary artery disease)    Cervical strain 11/21/2014   Cervicogenic headache 11/21/2014   Depression    Fibromyalgia    GERD (gastroesophageal reflux disease)    Gout 07/09/2014   Hyperlipidemia    Hypothyroidism 07/09/2014   Memory difficulty 02/17/2015   S/P CABG x 3 07/09/2014    Past Surgical History:  Procedure Laterality Date   BACK SURGERY     CATARACT EXTRACTION Bilateral    CERVICAL FUSION     CORONARY ARTERY BYPASS GRAFT  March 2015   Performed in [facility]   KNEE SURGERY     ROTATOR CUFF REPAIR      Social History   Socioeconomic History   Marital status: Married    Spouse name: Delon   Number of children: 4   Years of education: Master's   Highest education level: Master's degree (e.g., MA, MS, MEng, MEd, MSW, MBA)  Occupational History   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  Tobacco Use   Smoking status: Former    Current packs/day: 0.00    Types: Cigarettes    Quit date: 04/12/1985    Years since quitting: 38.8   Smokeless tobacco: Never  Vaping Use   Vaping status:  Never Used  Substance and Sexual Activity   Alcohol use: No    Alcohol/week: 0.0 standard drinks of alcohol   Drug use: No   Sexual activity: Not Currently    Partners: Female  Other Topics Concern   Not on file  Social History Narrative   Lives with his wife. He enjoys remote control sailing.   Social Drivers of Corporate Investment Banker Strain: Low Risk  (01/03/2024)   Received from [facility]   Overall Financial Resource Strain (CARDIA)    How hard is it for you to pay for the very basics like food, housing, medical care, and heating?: Not hard at all  Food Insecurity: No Food Insecurity (01/03/2024)   Received from [facility]   Hunger Vital Sign    Within the past 12 months, you worried that your food would run out before you got the money to buy more.: Never true    Within the past 12 months, the food you bought just didn't last and you didn't have money to get more.: Never true  Transportation Needs: No Transportation Needs (01/03/2024)   Received from [facility] - Transportation    In the past 12 months, has lack of transportation kept  you from medical appointments or from getting medications?: No    In the past 12 months, has lack of transportation kept you from meetings, work, or from getting things needed for daily living?: No  Physical Activity: Insufficiently Active (03/28/2023)   Exercise Vital Sign    Days of Exercise per Week: 2 days    Minutes of Exercise per Session: 30 min  Stress: No Stress Concern Present (03/28/2023)   [employer] of Occupational Health - Occupational Stress Questionnaire    Feeling of Stress : Only a little  Social Connections: Socially Integrated (03/28/2023)   Social Connection and Isolation Panel    Frequency of Communication with Friends and Family: Twice a week    Frequency of Social Gatherings with Friends and Family: Twice a week    Attends Religious Services: More than 4 times per year    Active Member of  [employer] or Organizations: Yes    Attends Engineer, Structural: More than 4 times per year    Marital Status: Married    Family History  Problem Relation Age of Onset   Heart attack Father    Stroke Mother    Thyroid  disease Mother    Breast cancer Sister    Thyroid  disease Brother    Thyroid  disease Sister    Thyroid  disease Sister     Health Maintenance  Topic Date Due   Medicare Annual Wellness (AWV)  03/27/2024   Zoster Vaccines- Shingrix (1 of 2) 05/22/2024 (Originally [birth year]/1972)   DTaP/Tdap/Td (2 - Td or Tdap) 03/17/2026   Colonoscopy  06/28/2026   Pneumococcal Vaccine: 50+ Years  Completed   Hepatitis C Screening  Completed   Meningococcal B Vaccine  Aged Out   Influenza Vaccine  Discontinued   COVID-19 Vaccine  Discontinued     ----------------------------------------------------------------------------------------------------------------------------------------------------------------------------------------------------------------- Physical Exam BP 113/78 (BP Location: Left Arm, Patient Position: Sitting, Cuff Size: Normal)   Pulse 81   Ht 5' 10 (1.778 m)   Wt 182 lb (82.6 kg)   SpO2 95%   BMI 26.11 kg/m   Physical Exam Constitutional:      Appearance: Normal appearance.  HENT:     Head: Normocephalic and atraumatic.  Eyes:     General: No scleral icterus. Cardiovascular:     Rate and Rhythm: Normal rate and regular rhythm.  Pulmonary:     Effort: Pulmonary effort is normal.     Breath sounds: Normal breath sounds.  Musculoskeletal:     Cervical back: Neck supple.  Neurological:     General: No focal deficit present.     Mental Status: He is alert.  Psychiatric:        Mood and Affect: Mood normal.        Behavior: Behavior normal.      ------------------------------------------------------------------------------------------------------------------------------------------------------------------------------------------------------------------- Assessment and Plan  Hypothyroidism Lab Results  Component Value Date   TSH 1.240 02/07/2023  He was pretty good at current dose levothyroxine .  Will plan to check labs at next visit.  Hyperlipidemia Lab Results  Component Value Date   LDLCALC 57 02/07/2023  Continue atorvastatin  and Zetia  at current dose.   Gout No recent flares.   Continue allopurinol .   CAD (coronary artery disease) Denies anginal symptoms.  Continue ASA Continue atorvstatin, update lipid panel   Meds ordered this encounter  Medications   atorvastatin  (LIPITOR) 80 MG tablet    Sig: Take 1 tablet (80 mg total) by mouth daily.    Dispense:  90 tablet    Refill:  3   ezetimibe  (ZETIA ) 10 MG tablet    Sig: Take 1 tablet (10 mg total) by mouth daily.    Dispense:  90 tablet    Refill:  3   levothyroxine  (SYNTHROID ) 100 MCG tablet    Sig: Take 1 tablet (100 mcg total) by mouth daily before breakfast.    Dispense:  90 tablet    Refill:  3   pantoprazole  (PROTONIX ) 40 MG tablet    Sig: Take 1 tablet (40 mg total) by mouth daily.    Dispense:  90 tablet    Refill:  2    Return in about 6 months (around 08/19/2024) for Hypertension.

## 2024-02-20 NOTE — Assessment & Plan Note (Addendum)
No recent flares.  Continue allopurinol.

## 2024-02-20 NOTE — Assessment & Plan Note (Signed)
Denies anginal symptoms.  Continue ASA Continue atorvstatin, update lipid panel

## 2024-02-20 NOTE — Assessment & Plan Note (Signed)
 Lab Results  Component Value Date   TSH 1.240 02/07/2023  He was pretty good at current dose levothyroxine .  Will plan to check labs at next visit.

## 2024-02-21 LAB — CBC
Hematocrit: 48.5 % (ref 37.5–51.0)
Hemoglobin: 16 g/dL (ref 13.0–17.7)
MCH: 30.8 pg (ref 26.6–33.0)
MCHC: 33 g/dL (ref 31.5–35.7)
MCV: 93 fL (ref 79–97)
Platelets: 238 x10E3/uL (ref 150–450)
RBC: 5.19 x10E6/uL (ref 4.14–5.80)
RDW: 12.5 % (ref 11.6–15.4)
WBC: 7.3 x10E3/uL (ref 3.4–10.8)

## 2024-02-21 LAB — CMP14+EGFR
ALT: 33 IU/L (ref 0–44)
AST: 33 IU/L (ref 0–40)
Albumin: 4.7 g/dL (ref 3.9–4.9)
Alkaline Phosphatase: 94 IU/L (ref 47–123)
BUN/Creatinine Ratio: 17 (ref 10–24)
BUN: 18 mg/dL (ref 8–27)
Bilirubin Total: 0.8 mg/dL (ref 0.0–1.2)
CO2: 21 mmol/L (ref 20–29)
Calcium: 10.1 mg/dL (ref 8.6–10.2)
Chloride: 106 mmol/L (ref 96–106)
Creatinine, Ser: 1.03 mg/dL (ref 0.76–1.27)
Globulin, Total: 2.3 g/dL (ref 1.5–4.5)
Glucose: 106 mg/dL — ABNORMAL HIGH (ref 70–99)
Potassium: 4.8 mmol/L (ref 3.5–5.2)
Sodium: 139 mmol/L (ref 134–144)
Total Protein: 7 g/dL (ref 6.0–8.5)
eGFR: 78 mL/min/1.73 (ref >59)

## 2024-02-21 LAB — PSA, TOTAL AND FREE
PSA, Free Pct: 34 %
PSA, Free: 0.17 ng/mL
Prostate Specific Ag, Serum: 0.5 ng/mL (ref 0.0–4.0)

## 2024-02-21 LAB — LIPID PANEL
Chol/HDL Ratio: 2.9 ratio (ref 0.0–5.0)
Cholesterol, Total: 115 mg/dL (ref 100–199)
HDL: 39 mg/dL — ABNORMAL LOW (ref >39)
LDL Chol Calc (NIH): 60 mg/dL (ref 0–99)
Triglycerides: 79 mg/dL (ref 0–149)
VLDL Cholesterol Cal: 16 mg/dL (ref 5–40)

## 2024-02-21 LAB — TSH+FREE T4
Free T4: 1 ng/dL (ref 0.82–1.77)
TSH: 1.76 u[IU]/mL (ref 0.450–4.500)

## 2024-02-21 LAB — VITAMIN D 25 HYDROXY (VIT D DEFICIENCY, FRACTURES): Vit D, 25-Hydroxy: 49.6 ng/mL (ref 30.0–100.0)

## 2024-03-02 ENCOUNTER — Ambulatory Visit: Payer: Self-pay | Admitting: Family Medicine

## 2024-03-21 ENCOUNTER — Other Ambulatory Visit: Payer: Self-pay

## 2024-03-21 DIAGNOSIS — E039 Hypothyroidism, unspecified: Secondary | ICD-10-CM

## 2024-03-21 MED ORDER — LEVOTHYROXINE SODIUM 100 MCG PO TABS: 100.0000 ug | ORAL_TABLET | Freq: Every day | ORAL | 3 refills | Status: AC

## 2024-05-14 ENCOUNTER — Ambulatory Visit: Payer: Self-pay

## 2024-05-14 NOTE — Telephone Encounter (Signed)
 FYI Only or Action Required?: FYI only for provider: ED advised.  Patient was last seen in primary care on 02/20/2024 by Alvia Bring, DO.  Called Nurse Triage reporting Dizziness and Blurred Vision.  Symptoms began several weeks ago.  Interventions attempted: Nothing.  Symptoms are: stable.  Triage Disposition: Go to ED Now (or PCP Triage)  Patient/caregiver understands and will follow disposition?: Yes                  Copied from CRM #[id number]. Topic: Appointments - Appointment Scheduling >> May 14, 2024  2:55 PM Tonda B wrote: Patient/patient representative is calling to schedule an appointment. Refer to attachments for appointment information.  Patient is having symptoms of vertigo  patients vision is blurry Reason for Disposition  Patient sounds very sick or weak to the triager  Answer Assessment - Initial Assessment Questions 1. DESCRIPTION: Describe your dizziness.     I get extremely hungry but I don't get a vomiting feeling. Real heavy feeling, headache, extremely dizzy. If standing, has to sit down. Ears will start ringing and headache comes on. Feels like going to black out. Spinning sensation. Confusion accompanies the dizziness. No confusion at this time.  2. VERTIGO: Do you feel like either you or the room is spinning or tilting?      Yes.  3. LIGHTHEADED: Do you feel lightheaded? (e.g., somewhat faint, woozy, weak upon standing)     Yes.  4. SEVERITY: How bad is it?  Can you walk?     Not present now but severe when they occur.  5. ONSET:  When did the dizziness begin?     3 weeks ago, 5 episodes. Can last up to 15 minutes.  6. AGGRAVATING FACTORS: Does anything make it worse? (e.g., standing, change in head position)     Can occur when standing, has to sit down.  7. CAUSE: What do you think is causing the dizziness?     Vertigo, based on internet research.  8. RECURRENT SYMPTOM: Have you had dizziness before? If Yes,  ask: When was the last time? What happened that time?     N/A.  9. OTHER SYMPTOMS: Do you have any other symptoms? (e.g., earache, headache, numbness, tinnitus, vomiting, weakness)     Blurry vision constant No headache at this time (ranges from 6-7 up to 10/10) No unilateral numbness or weakness, changes in speech, double vision or loss of vision, unsteady gait/falls,  Protocols used: Dizziness - Vertigo-A-AH

## 2024-05-21 ENCOUNTER — Inpatient Hospital Stay: Admitting: Family Medicine

## 2024-05-22 ENCOUNTER — Inpatient Hospital Stay: Admitting: Family Medicine

## 2024-08-20 ENCOUNTER — Ambulatory Visit: Admitting: Family Medicine
